# Patient Record
Sex: Male | Born: 1960 | Race: White | Hispanic: No | Marital: Married | Smoking: Never smoker
Health system: Southern US, Community
[De-identification: ages and names within clinical notes are randomized; demographics above are authoritative.]

## PROBLEM LIST (undated history)

## (undated) DIAGNOSIS — E039 Hypothyroidism, unspecified: Secondary | ICD-10-CM

## (undated) DIAGNOSIS — G4733 Obstructive sleep apnea (adult) (pediatric): Secondary | ICD-10-CM

## (undated) DIAGNOSIS — E079 Disorder of thyroid, unspecified: Secondary | ICD-10-CM

## (undated) DIAGNOSIS — D759 Disease of blood and blood-forming organs, unspecified: Secondary | ICD-10-CM

## (undated) DIAGNOSIS — J302 Other seasonal allergic rhinitis: Secondary | ICD-10-CM

## (undated) DIAGNOSIS — E785 Hyperlipidemia, unspecified: Secondary | ICD-10-CM

## (undated) DIAGNOSIS — F419 Anxiety disorder, unspecified: Secondary | ICD-10-CM

## (undated) DIAGNOSIS — I10 Essential (primary) hypertension: Secondary | ICD-10-CM

## (undated) DIAGNOSIS — I2510 Atherosclerotic heart disease of native coronary artery without angina pectoris: Secondary | ICD-10-CM

## (undated) DIAGNOSIS — I447 Left bundle-branch block, unspecified: Secondary | ICD-10-CM

## (undated) DIAGNOSIS — R0602 Shortness of breath: Secondary | ICD-10-CM

## (undated) DIAGNOSIS — K219 Gastro-esophageal reflux disease without esophagitis: Secondary | ICD-10-CM

## (undated) DIAGNOSIS — C4492 Squamous cell carcinoma of skin, unspecified: Secondary | ICD-10-CM

## (undated) DIAGNOSIS — K759 Inflammatory liver disease, unspecified: Secondary | ICD-10-CM

## (undated) DIAGNOSIS — T8859XA Other complications of anesthesia, initial encounter: Secondary | ICD-10-CM

## (undated) DIAGNOSIS — R112 Nausea with vomiting, unspecified: Secondary | ICD-10-CM

## (undated) HISTORY — DX: Obstructive sleep apnea (adult) (pediatric): G47.33

## (undated) HISTORY — DX: Squamous cell carcinoma of skin, unspecified: C44.92

## (undated) HISTORY — DX: Hyperlipidemia, unspecified: E78.5

## (undated) HISTORY — DX: Atherosclerotic heart disease of native coronary artery without angina pectoris: I25.10

## (undated) HISTORY — DX: Shortness of breath: R06.02

## (undated) HISTORY — DX: Other seasonal allergic rhinitis: J30.2

## (undated) HISTORY — PX: OTHER SURGICAL HISTORY: SHX169

## (undated) HISTORY — PX: KNEE SURGERY: SHX244

## (undated) HISTORY — DX: Left bundle-branch block, unspecified: I44.7

## (undated) HISTORY — DX: Essential (primary) hypertension: I10

## (undated) HISTORY — PX: TONSILLECTOMY: SUR1361

## (undated) HISTORY — PX: TOE SURGERY: SHX1073

## (undated) HISTORY — PX: NASAL FRACTURE SURGERY: SHX718

---
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
## 2013-01-15 HISTORY — PX: COLONOSCOPY: SHX174

## 2013-09-28 ENCOUNTER — Emergency Department (HOSPITAL_BASED_OUTPATIENT_CLINIC_OR_DEPARTMENT_OTHER): Payer: 59

## 2013-09-28 ENCOUNTER — Encounter (HOSPITAL_BASED_OUTPATIENT_CLINIC_OR_DEPARTMENT_OTHER): Payer: Self-pay | Admitting: Emergency Medicine

## 2013-09-28 ENCOUNTER — Observation Stay (HOSPITAL_BASED_OUTPATIENT_CLINIC_OR_DEPARTMENT_OTHER)
Admission: EM | Admit: 2013-09-28 | Discharge: 2013-09-29 | Disposition: A | Discharge status: Home / Self Care | Payer: 59 | Attending: Internal Medicine | Admitting: Internal Medicine

## 2013-09-28 DIAGNOSIS — R030 Elevated blood-pressure reading, without diagnosis of hypertension: Principal | ICD-10-CM | POA: Insufficient documentation

## 2013-09-28 DIAGNOSIS — E039 Hypothyroidism, unspecified: Secondary | ICD-10-CM | POA: Diagnosis not present

## 2013-09-28 DIAGNOSIS — R0602 Shortness of breath: Secondary | ICD-10-CM | POA: Diagnosis not present

## 2013-09-28 DIAGNOSIS — I10 Essential (primary) hypertension: Secondary | ICD-10-CM | POA: Diagnosis present

## 2013-09-28 DIAGNOSIS — I459 Conduction disorder, unspecified: Secondary | ICD-10-CM | POA: Diagnosis not present

## 2013-09-28 DIAGNOSIS — I447 Left bundle-branch block, unspecified: Secondary | ICD-10-CM | POA: Diagnosis present

## 2013-09-28 DIAGNOSIS — I160 Hypertensive urgency: Secondary | ICD-10-CM | POA: Insufficient documentation

## 2013-09-28 DIAGNOSIS — IMO0001 Reserved for inherently not codable concepts without codable children: Secondary | ICD-10-CM

## 2013-09-28 HISTORY — DX: Disorder of thyroid, unspecified: E07.9

## 2013-09-28 HISTORY — DX: Hypertensive urgency: I16.0

## 2013-09-28 LAB — BASIC METABOLIC PANEL
BUN: 17 mg/dL (ref 6–23)
CHLORIDE: 100 meq/L (ref 96–112)
CO2: 27 meq/L (ref 19–32)
Calcium: 9.4 mg/dL (ref 8.4–10.5)
Creatinine, Ser: 1 mg/dL (ref 0.50–1.35)
GFR calc Af Amer: >90 mL/min (ref >90)
GFR calc non Af Amer: 83 mL/min — ABNORMAL LOW (ref >90)
GLUCOSE: 100 mg/dL — AB (ref 70–99)
Potassium: 4.1 mEq/L (ref 3.7–5.3)
SODIUM: 140 meq/L (ref 137–147)

## 2013-09-28 LAB — CBC WITH DIFFERENTIAL/PLATELET
Basophils Absolute: 0 10*3/uL (ref 0.0–0.1)
Basophils Relative: 0 % (ref 0–1)
Eosinophils Absolute: 0.1 10*3/uL (ref 0.0–0.7)
Eosinophils Relative: 1 % (ref 0–5)
HEMATOCRIT: 44.1 % (ref 39.0–52.0)
Hemoglobin: 15.7 g/dL (ref 13.0–17.0)
LYMPHS ABS: 1.3 10*3/uL (ref 0.7–4.0)
LYMPHS PCT: 20 % (ref 12–46)
MCH: 32.3 pg (ref 26.0–34.0)
MCHC: 35.6 g/dL (ref 30.0–36.0)
MCV: 90.7 fL (ref 78.0–100.0)
MONO ABS: 0.6 10*3/uL (ref 0.1–1.0)
Monocytes Relative: 9 % (ref 3–12)
NEUTROS ABS: 4.4 10*3/uL (ref 1.7–7.7)
Neutrophils Relative %: 69 % (ref 43–77)
Platelets: 122 10*3/uL — ABNORMAL LOW (ref 150–400)
RBC: 4.86 MIL/uL (ref 4.22–5.81)
RDW: 12.7 % (ref 11.5–15.5)
WBC: 6.4 10*3/uL (ref 4.0–10.5)

## 2013-09-28 LAB — TROPONIN I: Troponin I: <0.3 ng/mL (ref <0.30)

## 2013-09-28 MED ORDER — CLONIDINE HCL 0.1 MG PO TABS
0.1000 mg | ORAL_TABLET | Freq: Once | ORAL | Status: AC
  Administered 2013-09-28: 0.1 mg via ORAL
  Filled 2013-09-28: qty 1

## 2013-09-28 MED ORDER — ASPIRIN 81 MG PO CHEW
324.0000 mg | CHEWABLE_TABLET | Freq: Once | ORAL | Status: AC
  Administered 2013-09-28: 324 mg via ORAL
  Filled 2013-09-28: qty 4

## 2013-09-28 NOTE — ED Provider Notes (Signed)
CSN: [id number]     Arrival date & time 09/28/13  1845 History   This chart was scribed for Malvin Johns, MD by Elby Beck, ED Scribe. This patient was seen in room MH11/MH11 and the patient's care was started at 8:45 PM.   Chief Complaint  Patient presents with  . Hypertension      The history is provided by the patient. No language interpreter was used.    HPI Comments: Peter Spencer is a 53 y.o. male who presents to the Emergency Department complaining of an HTN episode today measured today at a work event- pt has no h/o HTN. Pt reports taking a blood pressure of 180/110 today. ED blood pressure is 161/94. Pt reports that while running at the gym yesterday he experienced associated SOB after running a mile, not usual for him as an avid runner who regularly runs greater distances. He denies any chest pain or tightness. Pt repeatedly reports a lot of work stress recently. Pt reports recent difficulty sleeping. Pt reports occasionall episodes of dizziness due to "low BP" that his family has. Pt denies CP, diaphoresis, HA, numbness, weakness, and reports that he has been "normal" today. Pt reports that he had normal day today, other than did not run. Pt had EKG recently. Pt denies smoking. Pt reports family history of heart problems with paternal grandfather having MI in his 68's, and then dying of CHF in his 59's. Pt maternal grandmother died of MI at 63. Pt family h/o low BP.   PCP  [place] [facility]    Past Medical History  Diagnosis Date  . Thyroid disease    Past Surgical History  Procedure Laterality Date  . Knee surgery    . Tonsillectomy     No family history on file. History  Substance Use Topics  . Smoking status: Never Smoker   . Smokeless tobacco: Not on file  . Alcohol Use: Yes    Review of Systems  Constitutional: Positive for activity change (no running today.). Negative for fever, chills, diaphoresis and fatigue.  HENT: Negative for congestion,  rhinorrhea and sneezing.   Eyes: Negative.   Respiratory: Positive for shortness of breath (after running a mile, pt reports not usual for him). Negative for cough and chest tightness.   Cardiovascular: Negative for chest pain and leg swelling.  Gastrointestinal: Negative for nausea, vomiting, abdominal pain, diarrhea and blood in stool.  Genitourinary: Negative for frequency, hematuria, flank pain and difficulty urinating.  Musculoskeletal: Negative for arthralgias and back pain.  Skin: Negative for rash.  Neurological: Negative for dizziness, speech difficulty, weakness, numbness and headaches.  Psychiatric/Behavioral: Positive for sleep disturbance.       "a lot of stress at work recently"      Allergies  Review of patient's allergies indicates no known allergies.  Home Medications   Current Outpatient Rx  Name  Route  Sig  Dispense  Refill  . Fexofenadine HCl (ALLEGRA PO)   Oral   Take by mouth.         . Levothyroxine Sodium (SYNTHROID PO)   Oral   Take by mouth.          BP 161/94  Pulse 84  Temp(Src) 98.7 F (37.1 C) (Oral)  Resp 16  Ht 6\' 3"  (1.905 m)  Wt 175 lb (79.379 kg)  BMI 21.87 kg/m2  SpO2 100% Physical Exam  Constitutional: He is oriented to person, place, and time. He appears well-developed and well-nourished.  HENT:  Head: Normocephalic  and atraumatic.  Eyes: Pupils are equal, round, and reactive to light.  Neck: Normal range of motion. Neck supple.  Cardiovascular: Normal rate, regular rhythm and normal heart sounds.   Pulmonary/Chest: Effort normal and breath sounds normal. No respiratory distress. He has no wheezes. He has no rales. He exhibits no tenderness.  Abdominal: Soft. Bowel sounds are normal. There is no tenderness. There is no rebound and no guarding.  Musculoskeletal: Normal range of motion. He exhibits no edema.  Lymphadenopathy:    He has no cervical adenopathy.  Neurological: He is alert and oriented to person, place, and  time.  Skin: Skin is warm and dry. No rash noted.  Psychiatric: He has a normal mood and affect.    ED Course  Procedures (including critical care time)  DIAGNOSTIC STUDIES: Oxygen Saturation is 100% on RA, normal by my interpretation.    COORDINATION OF CARE: 8:55 PM-Discussed treatment plan which includes comparing a new EKG with older EKG, measuring BP, a CXR and labs with pt at bedside. Pt agreed to plan.   Labs Review Labs Reviewed  CBC WITH DIFFERENTIAL - Abnormal; Notable for the following:    Platelets 122 (*)    All other components within normal limits  BASIC METABOLIC PANEL - Abnormal; Notable for the following:    Glucose, Bld 100 (*)    GFR calc non Af Amer 83 (*)    All other components within normal limits  TROPONIN I   Imaging Review Dg Chest 2 View  09/28/2013   CLINICAL DATA:  Elevated blood pressure.  EXAM: CHEST  2 VIEW  COMPARISON:  None.  FINDINGS: The cardiac silhouette, mediastinal and hilar contours are within normal limits. Lungs demonstrate hyperinflation and probable emphysematous changes. No infiltrates, edema or effusions. Slight density at the end of the first anterior ribs bilaterally, right greater than left but no definite pulmonary lesions. The bony thorax is intact.  IMPRESSION: Emphysematous changes but no acute pulmonary findings.   Electronically Signed   By: Kalman Jewels M.D.   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  Had normal exercise stress test in May 2014.  Now with LBBB on EKG, exertional SOB, elevated BP. Spoke with Dr. Colon Flattery with cards, decided would be best to admit for serial enzymes, further eval.  Spoke with [facility] who will admit.  I personally performed the services described in this documentation, which was scribed in my presence.  The recorded information has been reviewed and considered.   Malvin Johns, MD 09/28/13 2337

## 2013-09-28 NOTE — Evaluation (Signed)
Received call from [place] about pt with chest pain/dyspnea x 1 day. Pt with reported exertional dyspnea over past 24 hours. Is a baseline runner. Noticed exertional dyspnea today. Baseline hx/o HTN. No true PCP. Does get executive physicals. + family hx/o heart disease. No hx/o CAD. EKG at [facility] shows new LBBB compared to old EKGs in pt's history. Trop  Neg x1. EDP states that she talked with cards fellow. Felt uncomfortable in pt going home. Recommend admission for ACS rule out. Pt given full dose ASA. Will admit to telemetry bed.

## 2013-09-28 NOTE — ED Notes (Signed)
RN Collie Siad informed of Pts food and what he wanted to eat and didn't want a tv dinner

## 2013-09-28 NOTE — ED Notes (Signed)
Took his BP today and it was 180/110. Last night he had pressure in his chest while running. He has been working out and admits to soreness in his chest different from the pressure yesterday.

## 2013-09-28 NOTE — ED Notes (Signed)
Report given to [place] on 3 E

## 2013-09-28 NOTE — ED Notes (Signed)
Patient transported to X-ray 

## 2013-09-29 ENCOUNTER — Encounter (HOSPITAL_COMMUNITY): Payer: Self-pay | Admitting: Internal Medicine

## 2013-09-29 DIAGNOSIS — I369 Nonrheumatic tricuspid valve disorder, unspecified: Secondary | ICD-10-CM

## 2013-09-29 DIAGNOSIS — I447 Left bundle-branch block, unspecified: Secondary | ICD-10-CM | POA: Diagnosis present

## 2013-09-29 DIAGNOSIS — R0602 Shortness of breath: Secondary | ICD-10-CM

## 2013-09-29 DIAGNOSIS — R030 Elevated blood-pressure reading, without diagnosis of hypertension: Principal | ICD-10-CM

## 2013-09-29 DIAGNOSIS — IMO0001 Reserved for inherently not codable concepts without codable children: Secondary | ICD-10-CM | POA: Diagnosis present

## 2013-09-29 HISTORY — DX: Left bundle-branch block, unspecified: I44.7

## 2013-09-29 LAB — COMPREHENSIVE METABOLIC PANEL
ALBUMIN: 3.7 g/dL (ref 3.5–5.2)
ALT: 18 U/L (ref 0–53)
AST: 17 U/L (ref 0–37)
Alkaline Phosphatase: 53 U/L (ref 39–117)
BUN: 15 mg/dL (ref 6–23)
CALCIUM: 8.5 mg/dL (ref 8.4–10.5)
CO2: 25 mEq/L (ref 19–32)
CREATININE: 0.88 mg/dL (ref 0.50–1.35)
Chloride: 100 mEq/L (ref 96–112)
GFR calc Af Amer: >90 mL/min (ref >90)
GFR calc non Af Amer: >90 mL/min (ref >90)
Glucose, Bld: 96 mg/dL (ref 70–99)
Potassium: 3.8 mEq/L (ref 3.7–5.3)
Sodium: 138 mEq/L (ref 137–147)
TOTAL PROTEIN: 6 g/dL (ref 6.0–8.3)
Total Bilirubin: 0.6 mg/dL (ref 0.3–1.2)

## 2013-09-29 LAB — CBC WITH DIFFERENTIAL/PLATELET
Basophils Absolute: 0 10*3/uL (ref 0.0–0.1)
Basophils Relative: 0 % (ref 0–1)
EOS PCT: 2 % (ref 0–5)
Eosinophils Absolute: 0.1 10*3/uL (ref 0.0–0.7)
HCT: 38.7 % — ABNORMAL LOW (ref 39.0–52.0)
Hemoglobin: 14 g/dL (ref 13.0–17.0)
LYMPHS ABS: 1.3 10*3/uL (ref 0.7–4.0)
LYMPHS PCT: 29 % (ref 12–46)
MCH: 32.3 pg (ref 26.0–34.0)
MCHC: 36.2 g/dL — ABNORMAL HIGH (ref 30.0–36.0)
MCV: 89.2 fL (ref 78.0–100.0)
Monocytes Absolute: 0.5 10*3/uL (ref 0.1–1.0)
Monocytes Relative: 11 % (ref 3–12)
Neutro Abs: 2.6 10*3/uL (ref 1.7–7.7)
Neutrophils Relative %: 58 % (ref 43–77)
Platelets: 100 10*3/uL — ABNORMAL LOW (ref 150–400)
RBC: 4.34 MIL/uL (ref 4.22–5.81)
RDW: 12.4 % (ref 11.5–15.5)
WBC: 4.4 10*3/uL (ref 4.0–10.5)

## 2013-09-29 LAB — TSH: TSH: 4.59 u[IU]/mL — AB (ref 0.350–4.500)

## 2013-09-29 LAB — TROPONIN I: Troponin I: <0.3 ng/mL (ref <0.30)

## 2013-09-29 MED ORDER — SODIUM CHLORIDE 0.9 % IJ SOLN
3.0000 mL | Freq: Two times a day (BID) | INTRAMUSCULAR | Status: DC
  Administered 2013-09-29: 3 mL via INTRAVENOUS

## 2013-09-29 MED ORDER — ZOLPIDEM TARTRATE ER 12.5 MG PO TBCR: 12.5000 mg | EXTENDED_RELEASE_TABLET | Freq: Every evening | ORAL | Status: DC | PRN

## 2013-09-29 MED ORDER — ACETAMINOPHEN 325 MG PO TABS: 650.0000 mg | ORAL_TABLET | Freq: Four times a day (QID) | ORAL | Status: DC | PRN

## 2013-09-29 MED ORDER — ONDANSETRON HCL 4 MG PO TABS: 4.0000 mg | ORAL_TABLET | Freq: Four times a day (QID) | ORAL | Status: DC | PRN

## 2013-09-29 MED ORDER — HYDRALAZINE HCL 20 MG/ML IJ SOLN: 10.0000 mg | INTRAMUSCULAR | Status: DC | PRN

## 2013-09-29 MED ORDER — ONDANSETRON HCL 4 MG/2ML IJ SOLN: 4.0000 mg | Freq: Four times a day (QID) | INTRAMUSCULAR | Status: DC | PRN

## 2013-09-29 MED ORDER — ASPIRIN EC 325 MG PO TBEC
325.0000 mg | DELAYED_RELEASE_TABLET | Freq: Every day | ORAL | Status: DC
  Administered 2013-09-29: 325 mg via ORAL
  Filled 2013-09-29: qty 1

## 2013-09-29 MED ORDER — ACETAMINOPHEN 650 MG RE SUPP: 650.0000 mg | Freq: Four times a day (QID) | RECTAL | Status: DC | PRN

## 2013-09-29 MED ORDER — LEVOTHYROXINE SODIUM 88 MCG PO TABS
88.0000 ug | ORAL_TABLET | Freq: Every day | ORAL | Status: DC
  Filled 2013-09-29 (×2): qty 1

## 2013-09-29 MED ORDER — ZOLPIDEM TARTRATE 5 MG PO TABS
5.0000 mg | ORAL_TABLET | Freq: Once | ORAL | Status: AC
  Administered 2013-09-29: 5 mg via ORAL
  Filled 2013-09-29: qty 1

## 2013-09-29 MED ORDER — ENOXAPARIN SODIUM 40 MG/0.4ML ~~LOC~~ SOLN
40.0000 mg | SUBCUTANEOUS | Status: DC
  Filled 2013-09-29: qty 0.4

## 2013-09-29 NOTE — Discharge Summary (Signed)
Physician Discharge Summary  Peter Spencer [id number] DOB: 1961-05-18 DOA: 09/28/2013  PCP: No PCP Per Patient  Admit date: 09/28/2013 Discharge date: 09/29/2013  Time spent: 35 minutes  Recommendations for Outpatient Follow-up:  1. Follow up with cardiology in 1 week for LBBB. 2. monitor Blood pressure.   Discharge Diagnoses:  Principal Problem:   SOB (shortness of breath) Active Problems:   LBBB (left bundle branch block)   Elevated blood pressure   Discharge Condition: stable  Diet recommendation: heart healthy  Filed Weights   09/28/13 1912 09/28/13 2348  Weight: 79.379 kg (175 lb) 79 kg (174 lb 2.6 oz)    History of present illness:  53 y.o. male with history of hypothyroidism had checked his blood pressure at his workplace and was found to be elevated with systolic around 016 and diastolic was more than 010. He came to the ER and had recheck blood pressure which was around 932T systolic and diastolic was around 557. Patient states that he used to he walks on the treadmill but he got short of breath easily yesterday and had to discontinue. In the ER patient's EKG were showing LBBB which was new (patient's wife had brought his old EKG and was normal but I do not have any access to it now). On-call cardiologist was consulted by the ED physician and patient was admitted for further observation. On my exam patient presently is not short of breath denies any chest pain or any nausea vomiting dizziness or palpitations. Patient has had yearly physical examination has had stress test last year which patient states was unremarkable had a CAT scan chest which showed possible coronary artery disease.    Hospital Course:  One episode Shortness of breath with LBBB : - SOB resolved before coming to the ED. - cardiac markers check which were negative x3. - Checked 2-D echo: Wall thickness was increased in a pattern of mild LVH. The estimated ejection fraction was 60%. Wall motion was  normal; there were no regional wall motion abnormalities. - no events on telemetry. - follow up with cardiology in 1 week.  Elevated blood pressure with out a diagnosis of of HTN  - Elevated Bp resolved with out any further treatment  Hypothyroidism:  - continue home medications.   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 The cavity size was normal. Wall thickness was increased in a pattern of mild LVH. The estimated ejection fraction was 60%. Wall motion was normal; there were no regional wall motion abnormalities. - Right ventricle: The cavity size was normal. Systolic function was normal.    Consultations:  none  Discharge Exam: Filed Vitals:   09/29/13 1429  BP: 129/79  Pulse: 86  Temp: 98 F (36.7 C)  Resp: 18    General: A&O x3 Cardiovascular: RRR Respiratory: good air movement CTA B/L  Discharge Instructions  Discharge Orders   Future Orders Complete By Expires   Diet - low sodium heart healthy  As directed    Increase activity slowly  As directed        Medication List         fexofenadine 180 MG tablet  Commonly known as:  ALLEGRA  Take 180 mg by mouth at bedtime.     levothyroxine 88 MCG tablet  Commonly known as:  SYNTHROID, LEVOTHROID  Take 88 mcg by mouth daily before breakfast.     zolpidem 12.5 MG CR tablet  Commonly known as:  AMBIEN CR  Take 1 tablet (12.5 mg total) by  mouth at bedtime as needed for sleep.       Allergies  Allergen Reactions  . Sulfa Antibiotics Nausea And Vomiting       Follow-up Information   Follow up with [facility] In 2 weeks. (hospital follow up)    Contact information:   [place] [address] [phone]       The results of significant diagnostics from this hospitalization (including imaging, microbiology, ancillary and laboratory) are listed below for reference.    Significant Diagnostic Studies: Dg Chest  2 View  09/28/2013   CLINICAL DATA:  Elevated blood pressure.  EXAM: CHEST  2 VIEW  COMPARISON:  None.  FINDINGS: The cardiac silhouette, mediastinal and hilar contours are within normal limits. Lungs demonstrate hyperinflation and probable emphysematous changes. No infiltrates, edema or effusions. Slight density at the end of the first anterior ribs bilaterally, right greater than left but no definite pulmonary lesions. The bony thorax is intact.  IMPRESSION: Emphysematous changes but no acute pulmonary findings.   Electronically Signed   By: Kalman Jewels M.D.   On: 09/28/2013 20:22    Microbiology: No results found for this or any previous visit (from the past 240 hour(s)).   Labs: Basic Metabolic Panel:  Recent Labs Lab 09/28/13 2033 09/29/13 0349  NA 140 138  K 4.1 3.8  CL 100 100  CO2 27 25  GLUCOSE 100* 96  BUN 17 15  CREATININE 1.00 0.88  CALCIUM 9.4 8.5   Liver Function Tests:  Recent Labs Lab 09/29/13 0349  AST 17  ALT 18  ALKPHOS 53  BILITOT 0.6  PROT 6.0  ALBUMIN 3.7   No results found for this basename: LIPASE, AMYLASE,  in the last 168 hours No results found for this basename: AMMONIA,  in the last 168 hours CBC:  Recent Labs Lab 09/28/13 2033 09/29/13 0349  WBC 6.4 4.4  NEUTROABS 4.4 2.6  HGB 15.7 14.0  HCT 44.1 38.7*  MCV 90.7 89.2  PLT 122* 100*   Cardiac Enzymes:  Recent Labs Lab 09/28/13 2033 09/29/13 0349 09/29/13 0650  TROPONINI <0.30 <0.30 <0.30   BNP: BNP (last 3 results) No results found for this basename: PROBNP,  in the last 8760 hours CBG: No results found for this basename: GLUCAP,  in the last 168 hours     Signed:  Charlynne Cousins  [facility] 09/29/2013, 3:10 PM

## 2013-09-29 NOTE — H&P (Signed)
[facility] History and Physical  Peter Spencer [id number] DOB: 1961/01/22 DOA: 09/28/2013  Referring physician: ER physician at [place]. PCP: No PCP Per Patient   Chief Complaint: Shortness of breath and elevated blood pressure.  HPI: Peter Spencer is a 53 y.o. male with history of hypothyroidism had checked his blood pressure at his workplace and was found to be elevated with systolic around 269 and diastolic was more than 485. He came to the ER and had recheck blood pressure which was around 462V systolic and diastolic was around 035. Patient states that he used to he walks on the treadmill but he got short of breath easily yesterday and had to discontinue. In the ER patient's EKG were showing LBBB which was new (patient's wife had brought his old EKG and was normal but I do not have any access to it now). On-call cardiologist was consulted by the ED physician and patient was admitted for further observation. On my exam patient presently is not short of breath denies any chest pain or any nausea vomiting dizziness or palpitations. Patient has had yearly physical examination has had stress test last year which patient states was unremarkable had a CAT scan chest which showed possible coronary artery disease.   Review of Systems: As presented in the history of presenting illness, rest negative.  Past Medical History  Diagnosis Date  . Thyroid disease    Past Surgical History  Procedure Laterality Date  . Knee surgery    . Tonsillectomy     Social History:  reports that he has never smoked. He does not have any smokeless tobacco history on file. He reports that he drinks alcohol. He reports that he does not use illicit drugs. Where does patient live home. Can patient participate in ADLs? Yes.  No Known Allergies  Family History:  Family History  Problem Relation Age of Onset  . CAD Other   . Stroke Neg Hx   . Diabetes Mellitus II Neg Hx       Prior  to Admission medications   Medication Sig Start Date End Date Taking? Authorizing Provider  Fexofenadine HCl (ALLEGRA PO) Take by mouth.   Yes Historical Provider, MD  Levothyroxine Sodium (SYNTHROID PO) Take by mouth.   Yes Historical Provider, MD    Physical Exam: Filed Vitals:   09/28/13 1912 09/28/13 2208 09/28/13 2300 09/28/13 2348  BP: 161/94 160/102 153/95 144/102  Pulse: 84 100 86 106  Temp: 98.7 F (37.1 C)   98.6 F (37 C)  TempSrc: Oral   Oral  Resp: 16 18  18   Height: 6\' 3"  (1.905 m)   6\' 9"  (2.057 m)  Weight: 79.379 kg (175 lb)   79 kg (174 lb 2.6 oz)  SpO2: 100% 100% 99% 98%     General:  Well-developed well-nourished.  Eyes: Anicteric no pallor.  ENT: No discharge from the ears eyes nose mouth.  Neck: No mass felt.  Cardiovascular: S1-S2 heard.  Respiratory: No rhonchi or crepitations.  Abdomen: Soft nontender bowel sounds present.  Skin: Rash on the forehead.  Musculoskeletal: No edema.  Psychiatric: Appears normal.  Neurologic: Alert awake oriented to time place and person. Moves all extremities.  Labs on Admission:  Basic Metabolic Panel:  Recent Labs Lab 09/28/13 2033  NA 140  K 4.1  CL 100  CO2 27  GLUCOSE 100*  BUN 17  CREATININE 1.00  CALCIUM 9.4   Liver Function Tests: No results found for this basename: AST, ALT, ALKPHOS,  BILITOT, PROT, ALBUMIN,  in the last 168 hours No results found for this basename: LIPASE, AMYLASE,  in the last 168 hours No results found for this basename: AMMONIA,  in the last 168 hours CBC:  Recent Labs Lab 09/28/13 2033  WBC 6.4  NEUTROABS 4.4  HGB 15.7  HCT 44.1  MCV 90.7  PLT 122*   Cardiac Enzymes:  Recent Labs Lab 09/28/13 2033  TROPONINI <0.30    BNP (last 3 results) No results found for this basename: PROBNP,  in the last 8760 hours CBG: No results found for this basename: GLUCAP,  in the last 168 hours  Radiological Exams on Admission: Dg Chest 2 View  09/28/2013   CLINICAL  DATA:  Elevated blood pressure.  EXAM: CHEST  2 VIEW  COMPARISON:  None.  FINDINGS: The cardiac silhouette, mediastinal and hilar contours are within normal limits. Lungs demonstrate hyperinflation and probable emphysematous changes. No infiltrates, edema or effusions. Slight density at the end of the first anterior ribs bilaterally, right greater than left but no definite pulmonary lesions. The bony thorax is intact.  IMPRESSION: Emphysematous changes but no acute pulmonary findings.   Electronically Signed   By: Kalman Jewels M.D.   On: 09/28/2013 20:22    EKG: Independently reviewed. Sinus rhythm with LBBB.  Assessment/Plan Principal Problem:   SOB (shortness of breath) Active Problems:   LBBB (left bundle branch block)   Elevated blood pressure   1. Shortness of breath with LBBB - patient's symptoms are concerning for cardiomyopathy. Check 2-D echo. Check cardiac markers. Consult cardiology in a.m. 2. Elevated blood pressure - for now I have placed patient on when necessary IV hydralazine for systolic blood pressure more than 160. Closely observe. 3. Hypothyroidism - continue home medications.    Code Status: Full code.  Family Communication: Patient's wife at the bedside.  Disposition Plan: Admit to inpatient.    Camille Thau N. [facility] Pager [phone].  If 7PM-7AM, please contact night-coverage [email or web address] Password [email or web address] 09/29/2013, 1:17 AM

## 2013-09-29 NOTE — Progress Notes (Signed)
Pt refused to allow lab to draw last set of tropinin.  Dr. Durel Salts notified and ordered for tropinin to be d/c'd.  Karie Kirks, Therapist, sports.

## 2013-09-29 NOTE — Progress Notes (Signed)
  Echocardiogram 2D Echocardiogram has been performed.  Basilia Jumbo 09/29/2013, 12:48 PM

## 2013-09-29 NOTE — Progress Notes (Signed)
All d/c instructions explained and given to pt at 1600.  Verbalized understanding.  D/c off floor via ambulation.  Refused w/c service.  [place] Emmitt,RN.

## 2013-10-01 NOTE — Progress Notes (Signed)
Utilization Review Completed Demetrius Mahler J. Adalie Mand, RN, BSN, NCM [phone]  

## 2013-10-22 ENCOUNTER — Ambulatory Visit: Payer: 59 | Admitting: Cardiology

## 2014-01-23 ENCOUNTER — Ambulatory Visit (HOSPITAL_BASED_OUTPATIENT_CLINIC_OR_DEPARTMENT_OTHER): Payer: 59

## 2014-11-18 DIAGNOSIS — I10 Essential (primary) hypertension: Secondary | ICD-10-CM | POA: Insufficient documentation

## 2014-11-18 DIAGNOSIS — E039 Hypothyroidism, unspecified: Secondary | ICD-10-CM

## 2014-11-18 DIAGNOSIS — F5104 Psychophysiologic insomnia: Secondary | ICD-10-CM

## 2014-11-18 HISTORY — DX: Essential (primary) hypertension: I10

## 2014-11-18 HISTORY — DX: Psychophysiologic insomnia: F51.04

## 2014-11-18 HISTORY — DX: Hypothyroidism, unspecified: E03.9

## 2014-12-02 ENCOUNTER — Other Ambulatory Visit: Payer: Self-pay | Admitting: Cardiology

## 2014-12-02 DIAGNOSIS — I2510 Atherosclerotic heart disease of native coronary artery without angina pectoris: Secondary | ICD-10-CM

## 2014-12-02 NOTE — Progress Notes (Unsigned)
Peter Spencer  Date of visit:  12/02/2014 DOB:  March 08, 1961    Age:  54 yrs. Medical record number:  [id number]     Account number:  [id number] Primary Care Provider: Mauri Pole ____________________________ CURRENT DIAGNOSES  1. Essential (primary) hypertension  2. Left bundle-branch block, unspecified  3. CAD (Native coronary artery)  4. Other hyperlipidemia  5. Hyperlipidemia, unspecified  6. Hypothyroidism, unspecified  7. Gastro-esophageal reflux disease without esophagitis ____________________________ ALLERGIES  Sulfa (Sulfonamides), Nausea ____________________________ MEDICATIONS  1. levothyroxine 88 mcg tablet, 1 p.o. daily  2. fexofenadine 180 mg tablet, 1 p.o. daily  3. aspirin 81 mg chewable tablet, 1 p.o. daily  4. Intermezzo 3.5 mg sublingual tablet, QHS  5. Crestor 20 mg tablet, 3 x week ____________________________ CHIEF COMPLAINTS  Followup of CAD (Native coronary artery) ____________________________ HISTORY OF PRESENT ILLNESS Patient returns for cardiac followup. He has had a good 6 months since he was here but remains anxious about his overall condition and his mild coronary artery disease previously. He has an intermittent left bundle branch block and had a negative myocardial perfusion scan last year. He complains that when he is running long distances that he cannot hold out as much. I could not really get definite complaints of angina. During other normal activities of daily living he feels perfectly fine. He specifically asks about when his next followup imaging study should be. He evidently had a CTA done 3 years ago but we do not have those reports. He brings in records of blood pressure showing very low blood pressures in the 80s and 90s. Blood pressure the office today is low. We had previously reduced his losartan because his pressure had been low previously. ____________________________ PAST HISTORY  Past Medical Illnesses:  hypertension,  hypothyroidism, GERD, hyperlipidemia;  Cardiovascular Illnesses:  conduction disorder-LBBB, CAD;  Surgical Procedures:  knee surgery, left, tonsillectomy/adenoids, nasal surg, toe surg;  NYHA Classification:  I;  Canadian Angina Classification:  Class 0: Asymptomatic;  Cardiology Procedures-Invasive:  no history of prior cardiac procedures;  Cardiology Procedures-Noninvasive:  treadmill, lexiscan cardiolite March 2015;  LVEF of 52% documented via nuclear study on 10/14/2013,   ____________________________ CARDIO-PULMONARY TEST DATES EKG Date:  10/08/2013;  Nuclear Study Date:  10/14/2013;  Echocardiography Date: 09/29/2013;   ____________________________ FAMILY HISTORY Father -- Atrial fibrillation, Dementia/Alzheimers, Parkinsonism, Father dead Mother -- Mother dead, Carcinoma of the pancreas Sister -- Sister alive and well ____________________________ SOCIAL HISTORY Alcohol Use:  wine 1 per day;  Smoking:  nonsmoker;  Diet:  regular diet;  Lifestyle:  married, 1 son and 1 daughter;  Exercise:  exercises regularly;  Occupation:  [employer], Metallurgist;   ____________________________ REVIEW OF SYSTEMS General:  denies recent weight change, fatique or change in exercise tolerance. Respiratory: denies dyspnea, cough, wheezing or hemoptysis. Cardiovascular:  please review HPI Abdominal: dyspepsia and waterbrashNeurological:  occasional headaches  ____________________________ PHYSICAL EXAMINATION VITAL SIGNS  Blood Pressure:  104/60 Sitting, Left arm, regular cuff  , 100/60 Standing, Left arm and regular cuff   Pulse:  76/min. Weight:  178.00 lbs. Height:  75"BMI: 22  Constitutional:  Pleasant, thin anxious white male in no acute distress Skin:  warm and dry to touch, no apparent skin lesions, or masses noted. Head:  normocephalic, normal hair pattern, no masses or tenderness Chest:  normal symmetry, clear to auscultation. Cardiac:  regular rhythm, normal S1 and S2, No S3 or S4, no  murmurs, gallops or rubs detected. Peripheral Pulses:  the femoral,dorsalis pedis, and posterior tibial pulses  are full and equal bilaterally with no bruits auscultated. Extremities & Back:  no deformities, clubbing, cyanosis, erythema or edema observed. Normal muscle strength and tone. Neurological:  no gross motor or sensory deficits noted, affect appropriate, oriented x3. ____________________________ MOST RECENT LIPID PANEL 12/02/14  CHOL TOTL 148 mg/dl, LDL 63 NM, HDL 52 mg/dl, TRIGLYCER 162 mg/dl and CHOL/HDL 2.8 (Calc) ____________________________ IMPRESSIONS/PLAN  1. History of mild coronary artery disease 2. Hyperlipidemia and treatment 3. History of hypertension with very low blood pressures noted nail  Recommendations:  He is concerned about his ability to run and do other things. We discussed radiation exposure and long-term monitoring in terms of coronary artery disease. I would recommend that he have a CPX test to assess whether he has any cardiovascular to his symptoms. If this is demonstrated then we can proceed with a CT angiogram if needed. He just had a negative myocardial perfusion scan last year. It is conceivable that some of his symptoms could be related to the development of a left bundle branch block with exercise. Followup otherwise in 6 months. His blood pressure is low and I asked him to stop taking losartan and to monitor his blood pressure. ____________________________ TODAYS ORDERS  1. Cardiopulmonary Stress Test: At patient convenience  2. Return Visit: 6 months                       ____________________________ Cardiology Physician:  Kerry Hough MD [facility]

## 2014-12-07 ENCOUNTER — Telehealth (HOSPITAL_COMMUNITY): Payer: Self-pay | Admitting: Cardiology

## 2014-12-07 NOTE — Telephone Encounter (Signed)
-----   Message from Jolaine Artist, MD sent at 12/02/2014  4:43 PM EDT ----- Regarding: FW: cpx test Can you help with this? Thanks!  ----- Message -----    From: Jacolyn Reedy, MD    Sent: 12/02/2014   4:09 PM      To: Jolaine Artist, MD, Candis Schatz Subject: cpx test                                       Please schedule this for this patient.  I was not able to order it through [facility].

## 2014-12-12 NOTE — Telephone Encounter (Signed)
-----   Message from Jolaine Artist, MD sent at 12/02/2014  4:43 PM EDT ----- Regarding: FW: cpx test Can you help with this? Thanks!  ----- Message -----    From: Jacolyn Reedy, MD    Sent: 12/02/2014   4:09 PM      To: Jolaine Artist, MD, Candis Schatz Subject: cpx test                                       Please schedule this for this patient.  I was not able to order it through [facility].

## 2014-12-13 ENCOUNTER — Other Ambulatory Visit (HOSPITAL_COMMUNITY): Payer: Self-pay | Admitting: Cardiology

## 2014-12-13 DIAGNOSIS — R0602 Shortness of breath: Secondary | ICD-10-CM

## 2014-12-14 ENCOUNTER — Other Ambulatory Visit (HOSPITAL_COMMUNITY): Payer: Self-pay | Admitting: Cardiology

## 2014-12-14 ENCOUNTER — Ambulatory Visit (HOSPITAL_COMMUNITY): Payer: 59 | Attending: Internal Medicine

## 2014-12-14 DIAGNOSIS — R0602 Shortness of breath: Secondary | ICD-10-CM

## 2015-02-28 ENCOUNTER — Other Ambulatory Visit: Payer: Self-pay | Admitting: Cardiology

## 2015-02-28 DIAGNOSIS — R0600 Dyspnea, unspecified: Secondary | ICD-10-CM

## 2015-03-08 ENCOUNTER — Other Ambulatory Visit: Payer: Self-pay | Admitting: Cardiology

## 2015-03-15 ENCOUNTER — Ambulatory Visit (HOSPITAL_COMMUNITY): Payer: 59

## 2015-03-20 ENCOUNTER — Encounter (HOSPITAL_COMMUNITY): Payer: Self-pay

## 2015-03-20 ENCOUNTER — Ambulatory Visit (HOSPITAL_COMMUNITY)
Admission: RE | Admit: 2015-03-20 | Discharge: 2015-03-20 | Disposition: A | Discharge status: Home / Self Care | Payer: 59 | Source: Ambulatory Visit | Attending: Cardiology | Admitting: Cardiology

## 2015-03-20 DIAGNOSIS — I2510 Atherosclerotic heart disease of native coronary artery without angina pectoris: Secondary | ICD-10-CM | POA: Insufficient documentation

## 2015-03-20 DIAGNOSIS — M479 Spondylosis, unspecified: Secondary | ICD-10-CM | POA: Insufficient documentation

## 2015-03-20 DIAGNOSIS — R079 Chest pain, unspecified: Secondary | ICD-10-CM | POA: Diagnosis not present

## 2015-03-20 DIAGNOSIS — R0600 Dyspnea, unspecified: Secondary | ICD-10-CM | POA: Insufficient documentation

## 2015-03-20 MED ORDER — NITROGLYCERIN 0.4 MG SL SUBL
0.8000 mg | SUBLINGUAL_TABLET | SUBLINGUAL | Status: DC | PRN
  Administered 2015-03-20: 0.8 mg via SUBLINGUAL
  Filled 2015-03-20: qty 25

## 2015-03-20 MED ORDER — METOPROLOL TARTRATE 1 MG/ML IV SOLN
INTRAVENOUS | Status: AC
  Administered 2015-03-20: 5 mg via INTRAVENOUS
  Filled 2015-03-20: qty 5

## 2015-03-20 MED ORDER — IOHEXOL 350 MG/ML SOLN
100.0000 mL | Freq: Once | INTRAVENOUS | Status: AC | PRN
  Administered 2015-03-20: 100 mL via INTRAVENOUS

## 2015-03-20 MED ORDER — NITROGLYCERIN 0.4 MG SL SUBL: 0.4000 mg | SUBLINGUAL_TABLET | SUBLINGUAL | Status: DC | PRN

## 2015-03-20 MED ORDER — METOPROLOL TARTRATE 1 MG/ML IV SOLN
5.0000 mg | INTRAVENOUS | Status: DC | PRN
  Filled 2015-03-20: qty 5

## 2015-03-20 MED ORDER — NITROGLYCERIN 0.4 MG SL SUBL
SUBLINGUAL_TABLET | SUBLINGUAL | Status: AC
  Administered 2015-03-20: 0.8 mg via SUBLINGUAL
  Filled 2015-03-20: qty 1

## 2015-03-20 MED ORDER — NITROGLYCERIN 0.4 MG SL SUBL
SUBLINGUAL_TABLET | SUBLINGUAL | Status: AC
  Filled 2015-03-20: qty 1

## 2015-07-30 HISTORY — PX: MOHS SURGERY: SUR867

## 2015-11-27 HISTORY — PX: COLONOSCOPY: SHX174

## 2015-11-27 LAB — HM COLONOSCOPY

## 2016-01-15 ENCOUNTER — Ambulatory Visit (HOSPITAL_BASED_OUTPATIENT_CLINIC_OR_DEPARTMENT_OTHER): Payer: Commercial Managed Care - HMO | Attending: Cardiology | Admitting: Internal Medicine

## 2016-01-15 VITALS — Ht 75.0 in | Wt 176.0 lb

## 2016-01-15 DIAGNOSIS — R0683 Snoring: Secondary | ICD-10-CM

## 2016-01-15 DIAGNOSIS — G4733 Obstructive sleep apnea (adult) (pediatric): Secondary | ICD-10-CM

## 2016-01-15 DIAGNOSIS — I2510 Atherosclerotic heart disease of native coronary artery without angina pectoris: Secondary | ICD-10-CM | POA: Insufficient documentation

## 2016-01-23 IMAGING — CT CT HEART MORP W/ CTA COR W/ SCORE W/ CA W/CM &/OR W/O CM
1 of 10 series · 1 of 20 positions shown, 2 images · IV contrast (Iodine)
Comparison: Chest radiograph of 09/28/2013

CLINICAL DATA: Chest pain

EXAM:
Cardiac/Coronary  CT
TECHNIQUE: The patient was scanned on a Philips 256 scanner.

[Series 300: locator · axial · 0.35mm/px · z∈[-233,-233]mm · 1 of 1 slices shown, 2 images]
[im 1/1  vessel]
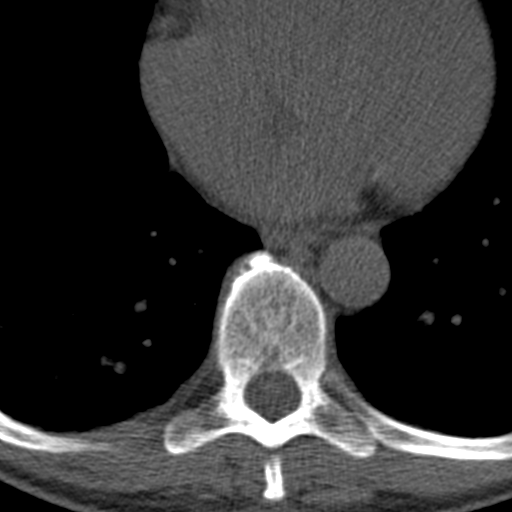
[im 1/1  lung]
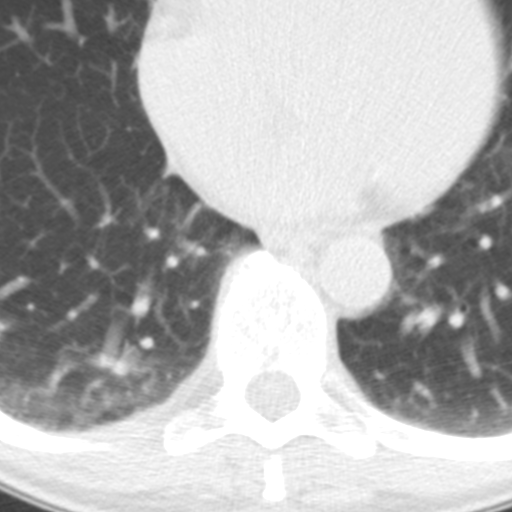

[1 of 20 positions shown; findings below may reference images not displayed]

FINDINGS: A 120 kV prospective scan was triggered in the descending thoracic
aorta at 111 HU's. Axial non-contrast 3 mm slices were carried out
through the heart. The data set was analyzed on a dedicated work
station and scored using the Agatson method. Gantry rotation speed
was 270 msecs and collimation was .9 mm. 5 mg of iv Metoprolol and
and 0.8 mg of sl NTG was given. The 3D data set was reconstructed in
5% intervals of the 67-82 % of the R-R cycle. Diastolic phases were
analyzed on a dedicated work station using MPR, MIP and VRT modes.
The patient received 80 cc of contrast.

Aorta:  Normal caliber.  No Dissection.

Aortic Valve:  Trileaflet.  No calcifications.

Coronary Arteries:  Normal coronary origin. Right dominance.

RCA is a large dominant vessel that has minimal noncalcified plaque
in its mid segment associated with 0-25% stenosis.

Left main is a large caliber long vessel that gives rise to LAD and
LCX arteries and has no plaque.

LAD is a large caliber vessel that wraps around the apex and gives
rise to one medium sized diagonal artery.

Proximal LAD at the takeoff of the diagonal branch has a moderate
mixed plaque with associated stenosis 25-50%. However this is a
plaque with high risk features - napkin ring sign (calcified ring
with lipomatous core). Mid LAD has minimal calcified plaque with
0-25% stenosis.

Diagonal branch has minimal calcified plaque with 0-25% stenosis.

LCX artery is medium size non-dominant vessel that gives rise to one
OM branch, there is no significant plaque.

Other findings:

No ASD or VSD seen.

Normal pulmonary vein drainage.
IMPRESSION: 1. Coronary calcium score of 107. This was 68 percentile for age and
sex matched control.

2. Normal coronary origin. Right dominance.

3. Moderate mixed plaque in the proximal LAD with associated
stenosis 25-50% with high risk features - aggressive risk factors
modification is recommended.

Larhonda Ordones

EXAM:
OVER-READ INTERPRETATION  CT CHEST

The following report is an over-read performed by radiologist Dr.
does not include interpretation of cardiac or coronary anatomy or
pathology. The coronary CTA interpretation by the cardiologist is
attached.
FINDINGS: Mediastinum/Nodes: Normal thoracic aortic caliber, without evidence
of dissection. No central pulmonary embolism, on this non-dedicated
study. No image thoracic adenopathy.

Lungs/Pleura: No pleural fluid.  No nodules or airspace opacities.

Upper abdomen: Normal imaged portions of the liver, spleen, stomach,
adrenal glands.

Musculoskeletal: No acute osseous abnormality. Thoracic spondylosis.
IMPRESSION: No significant extracardiac findings within the imaged chest.

## 2016-01-27 DIAGNOSIS — G4733 Obstructive sleep apnea (adult) (pediatric): Secondary | ICD-10-CM

## 2016-01-27 DIAGNOSIS — R0683 Snoring: Secondary | ICD-10-CM | POA: Diagnosis not present

## 2016-01-27 NOTE — Procedures (Signed)
   Patient Name: Peter Spencer, Peter Spencer Date: 01/15/2016 Gender: Male D.O.B: 14-Oct-1960 Age (years): 55 Referring Provider: Landry Corporal Height (inches): 97 Interpreting Physician: Baird Lyons MD, ABSM Weight (lbs): 176 RPSGT: Jacolyn Reedy BMI: 22 MRN: [id number] Neck Size: 17.00 CLINICAL INFORMATION Sleep Study Type: unattended HST   Indication for sleep study: CAD, Snoring   Epworth Sleepiness Score: 3 SLEEP STUDY TECHNIQUE A multi-channel overnight portable sleep study was performed. The channels recorded were: nasal airflow, thoracic respiratory movement, and oxygen saturation with a pulse oximetry. Snoring was also monitored.  MEDICATIONS Patient self administered medications include: none reported during study night.  SLEEP ARCHITECTURE Patient was studied for 405.8 minutes. The sleep efficiency was 93.3 % and the patient was supine for 29.8%. The arousal index was 0.0 per hour.  RESPIRATORY PARAMETERS The overall AHI was 8.4 per hour, with a central apnea index of 0.0 per hour. The oxygen nadir was 78% during sleep. Total time Mean oxygen saturation was 94%    CARDIAC DATA Mean heart rate during sleep was 78.1 bpm.  IMPRESSIONS - Mild obstructive sleep apnea occurred during this study (AHI = 8.4/h). - No significant central sleep apnea occurred during this study (CAI = 0.0/h). - Mild oxygen desaturation was noted during this study (Min O2 = 78%, Mean 94%). - No snoring was audible during this study.  DIAGNOSIS - Obstructive Sleep Apnea (327.23 [G47.33 ICD-10])  RECOMMENDATIONS - CPAP, a dental oral device or a chin strap might be considerations based on clinical judgment. - Positional therapy avoiding supine position during sleep. - Surgical consultation for Uvulopalatopharyngoplasty (UPPP) may be considered in some cases. - Avoid alcohol, sedatives and other CNS depressants that may worsen sleep apnea and disrupt normal sleep architecture. -  Sleep hygiene should be reviewed to assess factors that may improve sleep quality. - Weight management and regular exercise should be initiated or continued.  Deneise Lever Diplomate, American Board of Sleep Medicine  ELECTRONICALLY SIGNED ON:  01/27/2016, 9:04 AM [place] PH: [phone]   FX: [phone] [place]

## 2016-04-25 ENCOUNTER — Other Ambulatory Visit: Payer: Self-pay | Admitting: Orthopedic Surgery

## 2016-04-25 LAB — HM HEPATITIS C SCREENING LAB: HM Hepatitis Screen: NEGATIVE

## 2016-05-29 HISTORY — PX: DUPUYTREN CONTRACTURE RELEASE: SHX1478

## 2016-07-05 ENCOUNTER — Encounter (HOSPITAL_BASED_OUTPATIENT_CLINIC_OR_DEPARTMENT_OTHER): Payer: Self-pay | Admitting: *Deleted

## 2016-07-11 ENCOUNTER — Encounter (HOSPITAL_BASED_OUTPATIENT_CLINIC_OR_DEPARTMENT_OTHER): Payer: Self-pay | Admitting: Anesthesiology

## 2016-07-11 ENCOUNTER — Encounter (HOSPITAL_BASED_OUTPATIENT_CLINIC_OR_DEPARTMENT_OTHER)
Admission: RE | Disposition: A | Discharge status: Home / Self Care | Payer: Self-pay | Source: Ambulatory Visit | Attending: Orthopedic Surgery

## 2016-07-11 ENCOUNTER — Ambulatory Visit (HOSPITAL_BASED_OUTPATIENT_CLINIC_OR_DEPARTMENT_OTHER): Payer: Commercial Managed Care - HMO | Admitting: Anesthesiology

## 2016-07-11 ENCOUNTER — Ambulatory Visit (HOSPITAL_BASED_OUTPATIENT_CLINIC_OR_DEPARTMENT_OTHER)
Admission: RE | Admit: 2016-07-11 | Discharge: 2016-07-11 | Disposition: A | Discharge status: Home / Self Care | Payer: Commercial Managed Care - HMO | Source: Ambulatory Visit | Attending: Orthopedic Surgery | Admitting: Orthopedic Surgery

## 2016-07-11 DIAGNOSIS — E039 Hypothyroidism, unspecified: Secondary | ICD-10-CM | POA: Insufficient documentation

## 2016-07-11 DIAGNOSIS — I447 Left bundle-branch block, unspecified: Secondary | ICD-10-CM | POA: Insufficient documentation

## 2016-07-11 DIAGNOSIS — M720 Palmar fascial fibromatosis [Dupuytren]: Secondary | ICD-10-CM | POA: Diagnosis present

## 2016-07-11 DIAGNOSIS — F419 Anxiety disorder, unspecified: Secondary | ICD-10-CM | POA: Diagnosis not present

## 2016-07-11 DIAGNOSIS — K219 Gastro-esophageal reflux disease without esophagitis: Secondary | ICD-10-CM | POA: Insufficient documentation

## 2016-07-11 DIAGNOSIS — Z8249 Family history of ischemic heart disease and other diseases of the circulatory system: Secondary | ICD-10-CM | POA: Insufficient documentation

## 2016-07-11 DIAGNOSIS — I10 Essential (primary) hypertension: Secondary | ICD-10-CM | POA: Insufficient documentation

## 2016-07-11 HISTORY — DX: Anxiety disorder, unspecified: F41.9

## 2016-07-11 HISTORY — DX: Hypothyroidism, unspecified: E03.9

## 2016-07-11 HISTORY — DX: Gastro-esophageal reflux disease without esophagitis: K21.9

## 2016-07-11 HISTORY — PX: FASCIECTOMY: SHX6525

## 2016-07-11 SURGERY — FASCIECTOMY, PALM
Anesthesia: Monitor Anesthesia Care | Site: Hand | Laterality: Left

## 2016-07-11 MED ORDER — HYDROCODONE-ACETAMINOPHEN 5-325 MG PO TABS: 1.0000 | ORAL_TABLET | Freq: Four times a day (QID) | ORAL | 0 refills | Status: DC | PRN

## 2016-07-11 MED ORDER — PROMETHAZINE HCL 25 MG/ML IJ SOLN: 6.2500 mg | INTRAMUSCULAR | Status: DC | PRN

## 2016-07-11 MED ORDER — CEFAZOLIN SODIUM-DEXTROSE 2-4 GM/100ML-% IV SOLN
INTRAVENOUS | Status: AC
  Filled 2016-07-11: qty 100

## 2016-07-11 MED ORDER — PROPOFOL 500 MG/50ML IV EMUL
INTRAVENOUS | Status: AC
  Filled 2016-07-11: qty 50

## 2016-07-11 MED ORDER — CEFAZOLIN SODIUM-DEXTROSE 2-4 GM/100ML-% IV SOLN
2.0000 g | INTRAVENOUS | Status: AC
  Administered 2016-07-11: 2 g via INTRAVENOUS

## 2016-07-11 MED ORDER — CHLORHEXIDINE GLUCONATE 4 % EX LIQD: 60.0000 mL | Freq: Once | CUTANEOUS | Status: DC

## 2016-07-11 MED ORDER — ONDANSETRON HCL 4 MG/2ML IJ SOLN
INTRAMUSCULAR | Status: AC
  Filled 2016-07-11: qty 2

## 2016-07-11 MED ORDER — SCOPOLAMINE 1 MG/3DAYS TD PT72: 1.0000 | MEDICATED_PATCH | Freq: Once | TRANSDERMAL | Status: DC | PRN

## 2016-07-11 MED ORDER — MIDAZOLAM HCL 2 MG/2ML IJ SOLN
INTRAMUSCULAR | Status: AC
  Filled 2016-07-11: qty 2

## 2016-07-11 MED ORDER — FENTANYL CITRATE (PF) 100 MCG/2ML IJ SOLN
50.0000 ug | INTRAMUSCULAR | Status: DC | PRN
  Administered 2016-07-11: 50 ug via INTRAVENOUS

## 2016-07-11 MED ORDER — PROPOFOL 10 MG/ML IV BOLUS
INTRAVENOUS | Status: DC | PRN
  Administered 2016-07-11: 20 mg via INTRAVENOUS

## 2016-07-11 MED ORDER — THROMBIN 5000 UNITS EX SOLR
CUTANEOUS | Status: DC | PRN
  Administered 2016-07-11: 5000 [IU] via TOPICAL

## 2016-07-11 MED ORDER — FENTANYL CITRATE (PF) 100 MCG/2ML IJ SOLN
INTRAMUSCULAR | Status: AC
  Filled 2016-07-11: qty 2

## 2016-07-11 MED ORDER — LACTATED RINGERS IV SOLN
INTRAVENOUS | Status: DC
  Administered 2016-07-11: 09:00:00 via INTRAVENOUS

## 2016-07-11 MED ORDER — MIDAZOLAM HCL 2 MG/2ML IJ SOLN
1.0000 mg | INTRAMUSCULAR | Status: DC | PRN
  Administered 2016-07-11: 2 mg via INTRAVENOUS

## 2016-07-11 MED ORDER — ONDANSETRON HCL 4 MG/2ML IJ SOLN
INTRAMUSCULAR | Status: DC | PRN
  Administered 2016-07-11: 4 mg via INTRAVENOUS

## 2016-07-11 MED ORDER — ROPIVACAINE HCL 7.5 MG/ML IJ SOLN
INTRAMUSCULAR | Status: DC | PRN
  Administered 2016-07-11: 20 mL via PERINEURAL

## 2016-07-11 MED ORDER — THROMBIN 5000 UNITS EX SOLR
CUTANEOUS | Status: AC
  Filled 2016-07-11: qty 5000

## 2016-07-11 MED ORDER — PROPOFOL 500 MG/50ML IV EMUL
INTRAVENOUS | Status: DC | PRN
  Administered 2016-07-11: 100 ug/kg/min via INTRAVENOUS

## 2016-07-11 MED ORDER — FENTANYL CITRATE (PF) 100 MCG/2ML IJ SOLN: 25.0000 ug | INTRAMUSCULAR | Status: DC | PRN

## 2016-07-11 SURGICAL SUPPLY — 38 items
BLADE MINI RND TIP GREEN BEAV (BLADE) ×3 IMPLANT
BLADE SURG 15 STRL LF DISP TIS (BLADE) ×1 IMPLANT
BLADE SURG 15 STRL SS (BLADE) ×2
BNDG COHESIVE 3X5 TAN STRL LF (GAUZE/BANDAGES/DRESSINGS) ×3 IMPLANT
BNDG ESMARK 4X9 LF (GAUZE/BANDAGES/DRESSINGS) ×3 IMPLANT
BNDG GAUZE ELAST 4 BULKY (GAUZE/BANDAGES/DRESSINGS) ×3 IMPLANT
CHLORAPREP W/TINT 26ML (MISCELLANEOUS) ×3 IMPLANT
CORDS BIPOLAR (ELECTRODE) ×3 IMPLANT
COVER BACK TABLE 60X90IN (DRAPES) ×3 IMPLANT
COVER MAYO STAND STRL (DRAPES) ×3 IMPLANT
CUFF TOURNIQUET SINGLE 18IN (TOURNIQUET CUFF) IMPLANT
DECANTER SPIKE VIAL GLASS SM (MISCELLANEOUS) IMPLANT
DRAPE EXTREMITY T 121X128X90 (DRAPE) ×3 IMPLANT
DRAPE SURG 17X23 STRL (DRAPES) ×3 IMPLANT
GAUZE SPONGE 4X4 12PLY STRL (GAUZE/BANDAGES/DRESSINGS) ×3 IMPLANT
GAUZE XEROFORM 1X8 LF (GAUZE/BANDAGES/DRESSINGS) ×3 IMPLANT
GLOVE BIOGEL PI IND STRL 8.5 (GLOVE) ×1 IMPLANT
GLOVE BIOGEL PI INDICATOR 8.5 (GLOVE) ×2
GLOVE SURG ORTHO 8.0 STRL STRW (GLOVE) ×3 IMPLANT
GOWN STRL REUS W/ TWL LRG LVL3 (GOWN DISPOSABLE) ×1 IMPLANT
GOWN STRL REUS W/TWL LRG LVL3 (GOWN DISPOSABLE) ×2
GOWN STRL REUS W/TWL XL LVL3 (GOWN DISPOSABLE) ×3 IMPLANT
LOOP VESSEL MAXI BLUE (MISCELLANEOUS) ×3 IMPLANT
NEEDLE PRECISIONGLIDE 27X1.5 (NEEDLE) ×3 IMPLANT
NS IRRIG 1000ML POUR BTL (IV SOLUTION) ×3 IMPLANT
PACK BASIN DAY SURGERY FS (CUSTOM PROCEDURE TRAY) ×3 IMPLANT
PAD CAST 3X4 CTTN HI CHSV (CAST SUPPLIES) ×1 IMPLANT
PADDING CAST COTTON 3X4 STRL (CAST SUPPLIES) ×2
SLEEVE SCD COMPRESS KNEE MED (MISCELLANEOUS) ×3 IMPLANT
SPLINT PLASTER CAST XFAST 3X15 (CAST SUPPLIES) IMPLANT
SPLINT PLASTER XTRA FASTSET 3X (CAST SUPPLIES)
STOCKINETTE 4X48 STRL (DRAPES) ×3 IMPLANT
SUT ETHILON 4 0 PS 2 18 (SUTURE) ×6 IMPLANT
SUT SILK 2 0 FS (SUTURE) ×3 IMPLANT
SYR BULB 3OZ (MISCELLANEOUS) ×3 IMPLANT
SYR CONTROL 10ML LL (SYRINGE) ×3 IMPLANT
TOWEL OR 17X24 6PK STRL BLUE (TOWEL DISPOSABLE) ×6 IMPLANT
UNDERPAD 30X30 (UNDERPADS AND DIAPERS) ×3 IMPLANT

## 2016-07-11 NOTE — Discharge Instructions (Addendum)

## 2016-07-11 NOTE — Transfer of Care (Signed)
Immediate Anesthesia Transfer of Care Note  Patient: Peter Spencer  Procedure(s) Performed: Procedure(s) with comments: FASCIECTOMY left index possible joint release (Left) - FASCIECTOMY left index possible joint release  Patient Location: PACU  Anesthesia Type:MAC and Regional  Level of Consciousness: awake, alert  and oriented  Airway & Oxygen Therapy: Patient Spontanous Breathing and Patient connected to face mask oxygen  Post-op Assessment: Report given to RN and Post -op Vital signs reviewed and stable  Post vital signs: Reviewed and stable  Last Vitals:  Vitals:   07/11/16 0929 07/11/16 1057  BP:  132/83  Pulse: 76 66  Resp: 12 10  Temp:  36.3 C    Last Pain:  Vitals:   07/11/16 0857  TempSrc: Oral         Complications: No apparent anesthesia complications

## 2016-07-11 NOTE — Brief Op Note (Signed)
07/11/2016  10:57 AM  PATIENT:  Vivia Budge  55 y.o. male  PRE-OPERATIVE DIAGNOSIS:  dupuytren's contracture left index  POST-OPERATIVE DIAGNOSIS:  dupuytren's contracture left index  PROCEDURE:  Procedure(s) with comments: FASCIECTOMY left index possible joint release (Left) - FASCIECTOMY left index possible joint release  SURGEON:  Surgeon(s) and Role:    * Daryll Brod, MD - Primary  PHYSICIAN ASSISTANT:   ASSISTANTS: none   ANESTHESIA:   regional and IV sedation  EBL:  Total I/O In: 100 [I.V.:100] Out: -   BLOOD ADMINISTERED:none  DRAINS: none   LOCAL MEDICATIONS USED:  NONE  SPECIMEN:  Excision  DISPOSITION OF SPECIMEN:  PATHOLOGY  COUNTS:  YES  TOURNIQUET:   Total Tourniquet Time Documented: Upper Arm (Left) - 54 minutes Total: Upper Arm (Left) - 54 minutes   DICTATION: .Other Dictation: Dictation Number [id number]  PLAN OF CARE: Discharge to home after PACU  PATIENT DISPOSITION:  PACU - hemodynamically stable.

## 2016-07-11 NOTE — Anesthesia Preprocedure Evaluation (Addendum)
Anesthesia Evaluation  Patient identified by MRN, date of birth, ID band Patient awake    Reviewed: Allergy & Precautions, NPO status , Patient's Chart, lab work & pertinent test results  Airway Mallampati: II  TM Distance: <3 FB Neck ROM: Full    Dental  (+) Teeth Intact, Dental Advisory Given   Pulmonary neg pulmonary ROS,    Pulmonary exam normal breath sounds clear to auscultation       Cardiovascular hypertension, Pt. on medications Normal cardiovascular exam+ dysrhythmias (LBBB)  Rhythm:Regular Rate:Normal     Neuro/Psych PSYCHIATRIC DISORDERS Anxiety negative neurological ROS     GI/Hepatic Neg liver ROS, GERD  Medicated,  Endo/Other  Hypothyroidism   Renal/GU negative Renal ROS     Musculoskeletal negative musculoskeletal ROS (+)   Abdominal   Peds  Hematology negative hematology ROS (+)   Anesthesia Other Findings Day of surgery medications reviewed with the patient.  Reproductive/Obstetrics                           Anesthesia Physical Anesthesia Plan  ASA: II  Anesthesia Plan: Regional and MAC   Post-op Pain Management:  Regional for Post-op pain   Induction: Intravenous  Airway Management Planned: Nasal Cannula  Additional Equipment:   Intra-op Plan:   Post-operative Plan:   Informed Consent: I have reviewed the patients History and Physical, chart, labs and discussed the procedure including the risks, benefits and alternatives for the proposed anesthesia with the patient or authorized representative who has indicated his/her understanding and acceptance.   Dental advisory given  Plan Discussed with:   Anesthesia Plan Comments: (Risks/benefits of regional block discussed with patient including risk of bleeding, infection, nerve damage, and possibility of failed block.  Also discussed backup plan of general anesthesia and associated risks.  Patient wishes to  proceed.  Discussed risks and benefits of supraclavicular nerve block including failure, bleeding, infection, nerve damage, weakness, shortness of breath, pneumothorax. Questions answered. Patient consents to block. )        Anesthesia Quick Evaluation

## 2016-07-11 NOTE — H&P (Signed)
  Peter Spencer is an 55 y.o. male.   Chief Complaint: contracture left index finger HPI: Peter Spencer is a 55 year old right hand dominant male who is of [place] descent with Dupuytren's contracture primarily to his index finger that is bent on his left side. This is primarily a contracture at the PIP joint with cord to the MP but no contracture. He does not have Dupuytren's cords on remaining digits, although he does have thickening of the palmar fascia bilaterally. He has no associated fever or penis problems. His sister has had a problem similar to this and his father has been operated on in the past. He states that his finger seems to have gotten slightly worse since last being seen. He has no new injury to it. He is not complaining of any pain or discomfort at the present time.       Past Medical History:  Diagnosis Date  . Anxiety   . GERD (gastroesophageal reflux disease)   . Hypertension    no meds now  . Hypothyroidism   . Left bundle branch block   . SOB (shortness of breath)   . Thyroid disease     Past Surgical History:  Procedure Laterality Date  . KNEE SURGERY    . TONSILLECTOMY      Family History  Problem Relation Age of Onset  . CAD Other   . Stroke Neg Hx   . Diabetes Mellitus II Neg Hx    Social History:  reports that he has never smoked. He has never used smokeless tobacco. He reports that he drinks alcohol. He reports that he does not use drugs.  Allergies:  Allergies  Allergen Reactions  . Sulfa Antibiotics Nausea And Vomiting  . Tetracyclines & Related Other (See Comments)    Caused liver enzymes to go up    No prescriptions prior to admission.    No results found for this or any previous visit (from the past 48 hour(s)).  No results found.   Pertinent items are noted in HPI.  Height 6\' 3"  (1.905 m), weight 79.4 kg (175 lb).  General appearance: alert, cooperative and appears stated age Head: Normocephalic,  without obvious abnormality Neck: no JVD Resp: clear to auscultation bilaterally Cardio: regular rate and rhythm, S1, S2 normal, no murmur, click, rub or gallop GI: soft, non-tender; bowel sounds normal; no masses,  no organomegaly Extremities: contracture left index finger Pulses: 2+ and symmetric Skin: Skin color, texture, turgor normal. No rashes or lesions Neurologic: Grossly normal Incision/Wound: na  Assessment/Plan  Assessment:  1. 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 Pre, peri and postoperative course were discussed along with risks and complications. He is aware that there is no guarantee to the surgery the possibility of infection recurrence injury to arteries nerves tendons incomplete relief of symptoms and dystrophy. He is advised the potential for extension. He is advised the potential for loss of the finger. He is advised that joint may need to be released and if this is done he may not regain full flexibility of the PIP joint. He would like to proceed he is scheduled for fasciectomy left index finger as an outpatient under regional anesthesia. Questions are encouraged and answered to his satisfaction.      Nolita Kutter R 07/11/2016, 5:03 AM

## 2016-07-11 NOTE — Anesthesia Procedure Notes (Signed)
Anesthesia Regional Block:  Supraclavicular block  Pre-Anesthetic Checklist: ,, timeout performed, Correct Patient, Correct Site, Correct Laterality, Correct Procedure, Correct Position, site marked, Risks and benefits discussed,  Surgical consent,  Pre-op evaluation,  At surgeon's request and post-op pain management  Laterality: Left  Prep: chloraprep       Needles:  Injection technique: Single-shot  Needle Type: Echogenic Stimulator Needle     Needle Length: 5cm 5 cm Needle Gauge: 22 and 22 G    Additional Needles:  Procedures: ultrasound guided (picture in chart) Supraclavicular block Narrative:  Start time: 07/11/2016 9:18 AM End time: 07/11/2016 9:28 AM Injection made incrementally with aspirations every 5 mL.  Performed by: Personally  Anesthesiologist: Catalina Gravel  Additional Notes: Functioning IV was confirmed and monitors were applied.  A 52mm 22ga Arrow echogenic stimulator needle was used. Sterile prep and drape, hand hygiene, and sterile gloves were used.  Negative aspiration and negative test dose prior to incremental administration of local anesthetic. The patient tolerated the procedure well.  Ultrasound guidance: relevent anatomy identified, needle position confirmed, local anesthetic spread visualized around nerve(s), vascular puncture avoided.  Image printed for medical record.

## 2016-07-11 NOTE — Anesthesia Postprocedure Evaluation (Signed)
Anesthesia Post Note  Patient: Peter Spencer  Procedure(s) Performed: Procedure(s) (LRB): FASCIECTOMY left index possible joint release (Left)  Patient location during evaluation: PACU Anesthesia Type: MAC and Regional Level of consciousness: awake and alert Pain management: pain level controlled Vital Signs Assessment: post-procedure vital signs reviewed and stable Respiratory status: spontaneous breathing, nonlabored ventilation, respiratory function stable and patient connected to nasal cannula oxygen Cardiovascular status: stable and blood pressure returned to baseline Anesthetic complications: no    Last Vitals:  Vitals:   07/11/16 1204 07/11/16 1243  BP: (!) 152/89 134/75  Pulse: 68 70  Resp: 14 16  Temp: 36.6 C 36.6 C    Last Pain:  Vitals:   07/11/16 1243  TempSrc: Oral  PainSc:                  Catalina Gravel

## 2016-07-11 NOTE — Op Note (Signed)
Dictation Number [id number]

## 2016-07-11 NOTE — Progress Notes (Signed)
Assisted Dr. Turk with left, ultrasound guided, supraclavicular block. Side rails up, monitors on throughout procedure. See vital signs in flow sheet. Tolerated Procedure well. 

## 2016-07-12 ENCOUNTER — Encounter (HOSPITAL_BASED_OUTPATIENT_CLINIC_OR_DEPARTMENT_OTHER): Payer: Self-pay | Admitting: Orthopedic Surgery

## 2016-07-15 NOTE — Op Note (Signed)
NAME:  Peter Spencer, Peter Spencer                ACCOUNT NO.:  MEDICAL RECORD NO.:  [id number]  LOCATION:                                 FACILITY:  PHYSICIAN:  Daryll Brod, M.D.       DATE OF BIRTH:  18-Jun-1961  DATE OF PROCEDURE:  07/11/2016 DATE OF DISCHARGE:                              OPERATIVE REPORT   PREOPERATIVE DIAGNOSIS:  Dupuytren's contracture, left index finger.  POSTOPERATIVE DIAGNOSIS:  Dupuytren's contracture, left index finger.  OPERATION:  Fasciectomy of Dupuytren's contracture, left index finger with joint release and V-Y advancements.  SURGEON:  Daryll Brod, M.D.  ANESTHESIA:  Supraclavicular block with sedation.  ANESTHESIOLOGIST:  Dr. Gifford Shave.  HISTORY:  The patient is a 55 year old male with a history of Dupuytren's contracture with a significant deformity to the PIP joint of approximately 80 degrees and in flexion, which is relatively fixed and he is admitted for fasciectomy.  He is aware that there is no guarantee to the surgery; the possibility of infection; recurrence of injury to arteries, nerves, tendons; incomplete relief of symptoms and dystrophy. In the preoperative area, the patient is seen, the extremity marked by both patient and surgeon, antibiotic given.  He is also aware that a 50% correction frequently is best that we are going to be able to obtain with release.  In the preoperative area, the patient is seen with the finger marked.  PROCEDURE IN DETAIL:  The patient was brought to the operating room after a supraclavicular block was carried out in the preoperative area by Dr. Gifford Shave.  He was prepped using ChloraPrep in supine position with the left arm free.  A 3-minute dry time was allowed and time-out taken confirming the patient and procedure.  A volar Bruner incision was made, carried down through subcutaneous tissue.  This began in the proximal palm, carried distally to the PIP joint level.  Neurovascular bundles were identified proximally.   The cord was identified.  This was traced distally and excised inserting primarily into the skin at the metacarpophalangeal joint level.  Both radial and ulnar neurovascular bundles were identified and protected throughout the procedure.  The cords were then from the first dorsal interosseous and the first palmar interosseous radially and ulnarly.  These formed large marked sizeable lateral digital sheaths on each side of the finger.  There was a central cord, which was also present.  With blunt sharp dissection, the cords were dissected free from the overlying skin flaps and the neurovascular bundles were identified and protected throughout the procedure.  The insertion of the proximal middle phalanx was then identified on both radial and ulnar aspects and these were excised.  This allowed the PIP joint to come to approximately a 25-degree flexion position.  The flexor sheath was then released along with the check rein ligaments on both radial and ulnar aspect of the proximal portion of the volar plate at the PIP joint.  This allowed the index finger PIP joint to fully extend. The wound was copiously irrigated with saline.  The neurovascular bundles were traced along their entire course and found to be intact. After irrigation, the thrombin was placed in the wound.  A doubled  over vessel loop was placed.  These were converted to Y's and the wound closed with interrupted 4-0 nylon sutures over the doubled over vessel loop as a drain.  A sterile compressive dressing was applied.  The tourniquet was deflated.  The index finger immediately pinked along with the remaining fingers.  A dorsal splint was applied.  The dressing completed, and he was taken to the recovery room for observation in satisfactory condition.  He will be discharged to home to return to the [place] in 1 week, on [place].          ______________________________ Daryll Brod, M.D.     GK/MEDQ  D:   07/11/2016  T:  07/12/2016  Job:  [id number]

## 2016-10-22 ENCOUNTER — Encounter: Payer: Self-pay | Admitting: Cardiology

## 2017-10-31 ENCOUNTER — Institutional Professional Consult (permissible substitution): Payer: Commercial Managed Care - HMO | Admitting: Pulmonary Disease

## 2017-12-11 ENCOUNTER — Institutional Professional Consult (permissible substitution): Payer: 59 | Admitting: Pulmonary Disease

## 2018-01-12 ENCOUNTER — Ambulatory Visit (INDEPENDENT_AMBULATORY_CARE_PROVIDER_SITE_OTHER): Payer: 59 | Admitting: Pulmonary Disease

## 2018-01-12 ENCOUNTER — Encounter: Payer: Self-pay | Admitting: Pulmonary Disease

## 2018-01-12 DIAGNOSIS — G4733 Obstructive sleep apnea (adult) (pediatric): Secondary | ICD-10-CM | POA: Insufficient documentation

## 2018-01-12 NOTE — Assessment & Plan Note (Addendum)
We discussed the implications of bruxism and mild sleep apnea and their effect on sleep quality.  Unclear at this point which is affecting his sleep quality more. Simple biteguard has not helped  Trial of sleep number bed   Referral to Dr. Oneal Grout to see if we can custom make an oral appliance to target both  call me back in 6 months, if symptoms are persistent we can consider attended polysomnogram to see if bruxism is really an issue and causing arousals  Cardiovascular implications of mild OSA are minimal

## 2018-01-12 NOTE — Progress Notes (Signed)
   Subjective:    Patient ID: Peter Spencer, male    DOB: October 23, 1960, 57 y.o.   MRN: [id number]  HPI    Review of Systems  Constitutional: Negative for fever and unexpected weight change.  HENT: Negative for congestion, dental problem, ear pain, nosebleeds, postnasal drip, rhinorrhea, sinus pressure, sneezing, sore throat and trouble swallowing.   Eyes: Negative for redness and itching.  Respiratory: Positive for shortness of breath. Negative for cough, chest tightness and wheezing.   Cardiovascular: Negative for palpitations and leg swelling.  Gastrointestinal: Negative for nausea and vomiting.  Genitourinary: Negative for dysuria.  Musculoskeletal: Negative for joint swelling.  Skin: Negative for rash.  Allergic/Immunologic: Positive for environmental allergies. Negative for food allergies and immunocompromised state.  Neurological: Negative for headaches.  Hematological: Does not bruise/bleed easily.  Psychiatric/Behavioral: Negative for dysphoric mood. The patient is nervous/anxious.        Objective:   Physical Exam        Assessment & Plan:

## 2018-01-12 NOTE — Patient Instructions (Signed)
We discussed the implications of bruxism and mild sleep apnea and their effect on sleep quality.  Trial of sleep number bed   Referral to Dr. Oneal Grout to see if we can custom make an oral appliance to target both  call me back in 6 months

## 2018-01-12 NOTE — Progress Notes (Signed)
Subjective:    Patient ID: Peter Spencer, male    DOB: [birth year], 57 y.o.   MRN: [id number]  HPI  Chief Complaint  Patient presents with  . Sleep Consult    Referred by Dr. Wynonia Lawman for possible OSA. Had a SS back in 2017. Has a history of bruxism as well.  States he feels like he doesn't sleep at all at night.     57 year old man presents for evaluation of sleep disordered breathing and bruxism. He reports teeth grinding for most of his adult life, he has used a bite guard in the past made by his dentist Dr. Delilah Shan, unfortunately this only prevented his teeth from wear and tear but did not take the bruxism away.  He has read a lot about bruxism and has even considered Botox injections.  He is also tried massage therapy.    Wife has reported snoring especially in the first half of the night. He feels that his tiredness has worsened over the last 2 years since he had a home sleep test. He now wakes up in the middle of night occasionally and will use a short acting Ambien-sublingual formulation.  He also reports allergies Has undergone allergy testing and takes Allegra during spring and fall.  Epworth sleepiness score is 4. Bedtime is between 10:11 PM, sleep latency is a few minutes, he is asleep on his stomach but now sleeps on his side with one pillow, reports 2-3 nocturnal awakenings including nocturia and is out of bed by 6:45 PM well rested without headaches or dryness of mouth  There is no history suggestive of cataplexy, sleep paralysis or parasomnias  He sees cardiology for left bundle branch block and hypertension, we reviewed home sleep test done 2 years ago. He works out religiously every day and uses a caffeine drink to help improve his workouts. He works as a Engineering geologist, drinks alcohol about 1 drink per day before his dinner  Significant tests/ events reviewed  HST 01/2016 AHI 8/h with mild oxygen desaturation    Past Medical History:  Diagnosis Date    . Anxiety   . GERD (gastroesophageal reflux disease)   . Hypertension    no meds now  . Hypothyroidism   . Left bundle branch block   . SOB (shortness of breath)   . Thyroid disease      Past Surgical History:  Procedure Laterality Date  . FASCIECTOMY Left 07/11/2016   Procedure: FASCIECTOMY left index possible joint release;  Surgeon: Daryll Brod, MD;  Location: [place];  Service: Orthopedics;  Laterality: Left;  FASCIECTOMY left index possible joint release  . KNEE SURGERY    . TONSILLECTOMY      Allergies  Allergen Reactions  . Sulfa Antibiotics Nausea And Vomiting  . Tetracyclines & Related Other (See Comments)    Caused liver enzymes to go up    Social History   Socioeconomic History  . Marital status: Married    Spouse name: Not on file  . Number of children: Not on file  . Years of education: Not on file  . Highest education level: Not on file  Occupational History  . Not on file  Social Needs  . Financial resource strain: Not on file  . Food insecurity:    Worry: Not on file    Inability: Not on file  . Transportation needs:    Medical: Not on file    Non-medical: Not on file  Tobacco Use  . Smoking status:  Never Smoker  . Smokeless tobacco: Never Used  Substance and Sexual Activity  . Alcohol use: Yes    Comment: one glass of wine 4 nights a week  . Drug use: No  . Sexual activity: Not on file  Lifestyle  . Physical activity:    Days per week: Not on file    Minutes per session: Not on file  . Stress: Not on file  Relationships  . Social connections:    Talks on phone: Not on file    Gets together: Not on file    Attends religious service: Not on file    Active member of club or organization: Not on file    Attends meetings of clubs or organizations: Not on file    Relationship status: Not on file  . Intimate partner violence:    Fear of current or ex partner: Not on file    Emotionally abused: Not on file    Physically  abused: Not on file    Forced sexual activity: Not on file  Other Topics Concern  . Not on file  Social History Narrative  . Not on file     Family History  Problem Relation Age of Onset  . CAD Other   . Stroke Neg Hx   . Diabetes Mellitus II Neg Hx      Review of Systems Positive for shortness of breath during workouts, bruxism, sneezing due to seasonal allergies, anxiety  Constitutional: negative for anorexia, fevers and sweats  Eyes: negative for irritation, redness and visual disturbance  Ears, nose, mouth, throat, and face: negative for earaches, epistaxis, nasal congestion and sore throat  Respiratory: negative for cough, dyspnea on exertion, sputum and wheezing  Cardiovascular: negative for chest pain,  lower extremity edema, orthopnea, palpitations and syncope  Gastrointestinal: negative for abdominal pain, constipation, diarrhea, melena, nausea and vomiting  Genitourinary:negative for dysuria, frequency and hematuria  Hematologic/lymphatic: negative for bleeding, easy bruising and lymphadenopathy  Musculoskeletal:negative for arthralgias, muscle weakness and stiff joints  Neurological: negative for coordination problems, gait problems, headaches and weakness  Endocrine: negative for diabetic symptoms including polydipsia, polyuria and weight loss     Objective:   Physical Exam  Gen. Pleasant,tall,thin, in no distress ENT - no thrush, no post nasal drip, class 2 airway Neck: No JVD, no thyromegaly, no carotid bruits Lungs: no use of accessory muscles, no dullness to percussion, clear without rales or rhonchi  Cardiovascular: Rhythm regular, heart sounds  normal, no murmurs or gallops, no peripheral edema Musculoskeletal: No deformities, no cyanosis or clubbing        Assessment & Plan:

## 2018-06-28 DIAGNOSIS — H3320 Serous retinal detachment, unspecified eye: Secondary | ICD-10-CM

## 2018-06-28 HISTORY — DX: Serous retinal detachment, unspecified eye: H33.20

## 2018-06-28 HISTORY — PX: RETINAL DETACHMENT SURGERY: SHX105

## 2018-07-24 ENCOUNTER — Telehealth: Payer: Self-pay | Admitting: Cardiology

## 2018-07-24 MED ORDER — ROSUVASTATIN CALCIUM 20 MG PO TABS: 20.0000 mg | ORAL_TABLET | ORAL | 0 refills | Status: DC

## 2018-07-24 NOTE — Telephone Encounter (Signed)
° ° ° °  1. Which medications need to be refilled? (please list name of each medication and dose if known) Rosuvastatin 20mg  tablet twice weekly  2. Which pharmacy/location (including street and city if local pharmacy) is medication to be sent to? [facility] [address] [place]  3. Do they need a 30 day or 90 day supply? [place]

## 2018-07-24 NOTE — Telephone Encounter (Signed)
Refill for rosuvastatin sent to [facility] in [facility] as requested. Patient is due for follow up in March 2020. Will have front desk contact patient to schedule an appointment.

## 2018-10-22 ENCOUNTER — Telehealth: Payer: Self-pay | Admitting: Emergency Medicine

## 2018-10-22 ENCOUNTER — Other Ambulatory Visit: Payer: Self-pay

## 2018-10-22 MED ORDER — ROSUVASTATIN CALCIUM 20 MG PO TABS: 20.0000 mg | ORAL_TABLET | ORAL | 1 refills | Status: DC

## 2018-10-22 NOTE — Telephone Encounter (Signed)
Left message for patient to return call regarding switching visit to a televisit.  

## 2018-10-23 DIAGNOSIS — E785 Hyperlipidemia, unspecified: Secondary | ICD-10-CM

## 2018-10-23 DIAGNOSIS — K219 Gastro-esophageal reflux disease without esophagitis: Secondary | ICD-10-CM

## 2018-10-23 DIAGNOSIS — I2510 Atherosclerotic heart disease of native coronary artery without angina pectoris: Secondary | ICD-10-CM

## 2018-10-23 HISTORY — DX: Atherosclerotic heart disease of native coronary artery without angina pectoris: I25.10

## 2018-10-23 HISTORY — DX: Gastro-esophageal reflux disease without esophagitis: K21.9

## 2018-10-26 ENCOUNTER — Encounter: Payer: Self-pay | Admitting: Cardiology

## 2018-10-26 ENCOUNTER — Telehealth (INDEPENDENT_AMBULATORY_CARE_PROVIDER_SITE_OTHER): Payer: 59 | Admitting: Cardiology

## 2018-10-26 ENCOUNTER — Other Ambulatory Visit: Payer: Self-pay

## 2018-10-26 ENCOUNTER — Telehealth: Payer: Self-pay | Admitting: Emergency Medicine

## 2018-10-26 VITALS — BP 131/79 | HR 78 | Ht 75.0 in | Wt 174.0 lb

## 2018-10-26 DIAGNOSIS — I160 Hypertensive urgency: Secondary | ICD-10-CM | POA: Diagnosis not present

## 2018-10-26 DIAGNOSIS — I447 Left bundle-branch block, unspecified: Secondary | ICD-10-CM | POA: Diagnosis not present

## 2018-10-26 DIAGNOSIS — E782 Mixed hyperlipidemia: Secondary | ICD-10-CM | POA: Diagnosis not present

## 2018-10-26 DIAGNOSIS — I2510 Atherosclerotic heart disease of native coronary artery without angina pectoris: Secondary | ICD-10-CM

## 2018-10-26 MED ORDER — ZOLPIDEM TARTRATE 3.5 MG SL SUBL: 1.0000 | SUBLINGUAL_TABLET | Freq: Every day | SUBLINGUAL | 2 refills | Status: DC

## 2018-10-26 MED ORDER — ZOLPIDEM TARTRATE 3.5 MG SL SUBL: 1.0000 | SUBLINGUAL_TABLET | Freq: Every day | SUBLINGUAL | 3 refills | Status: DC

## 2018-10-26 MED ORDER — ROSUVASTATIN CALCIUM 20 MG PO TABS: 20.0000 mg | ORAL_TABLET | ORAL | 1 refills | Status: DC

## 2018-10-26 NOTE — Telephone Encounter (Signed)
Left message for patient to return call to schedule 6 month virtual follow up appointment.

## 2018-10-26 NOTE — Telephone Encounter (Signed)
Patient will need to be rescheduled for a echocardiogram per Dr. Agustin Cree.

## 2018-10-26 NOTE — Patient Instructions (Signed)
Medication Instructions:  Your physician recommends that you continue on your current medications as directed. Please refer to the Current Medication list given to you today.  If you need a refill on your cardiac medications before your next appointment, please call your pharmacy.   Lab work: Your physician recommends that you return for lab work within the next wee: lipids  If you have labs (blood work) drawn today and your tests are completely normal, you will receive your results only by: Marland Kitchen MyChart Message (if you have MyChart) OR . A paper copy in the mail If you have any lab test that is abnormal or we need to change your treatment, we will call you to review the results.  Testing/Procedures: Your physician has requested that you have an echocardiogram. Echocardiography is a painless test that uses sound waves to create images of your heart. It provides your doctor with information about the size and shape of your heart and how well your heart's chambers and valves are working. This procedure takes approximately one hour. There are no restrictions for this procedure.    Follow-Up: At [facility], you and your health needs are our priority.  As part of our continuing mission to provide you with exceptional heart care, we have created designated Provider Care Teams.  These Care Teams include your primary Cardiologist (physician) and Advanced Practice Providers (APPs -  Physician Assistants and Nurse Practitioners) who all work together to provide you with the care you need, when you need it. You will need a follow up appointment in 6 months.  Please call our office 2 months in advance to schedule this appointment.  You may see No primary care provider on file. or another member of our [employer] Provider Team in [place]: Shirlee More, MD . Jyl Heinz, MD  Any Other Special Instructions Will Be Listed Below (If Applicable).

## 2018-10-26 NOTE — Progress Notes (Signed)
Virtual Visit via Video Note    Evaluation Performed:  Follow-up visit  This visit type was conducted due to national recommendations for restrictions regarding the COVID-19 Pandemic (e.g. social distancing).  This format is felt to be most appropriate for this patient at this time.  All issues noted in this document were discussed and addressed.  No physical exam was performed (except for noted visual exam findings with Video Visits).  Please refer to the patient's chart (MyChart message for video visits and phone note for telephone visits) for the patient's consent to telehealth for [facility].  Date:  10/26/2018  ID: Peter Spencer, DOB January 07, 1961, MRN [id number]   Patient Location:  [place] [address]   Provider location:   [place] Office  PCP:  Patient, No Pcp Per  Cardiologist:  Jenne Campus, MD     Chief Complaint: Just regular follow-up  History of Present Illness:    Peter Spencer is a 58 y.o. male  who presents via audio/video conferencing for a telehealth visit today.  With a history of left bundle branch block, ejection fraction 5055% by echocardiogram in 2017.  Negative stress test in March 2017.  Overall seems to be doing well.  This is just regular follow-up office visit.  He is patient of Dr. Wynonia Lawman.  He had some problem with the eye that eventually resulted in eye surgery.  Because of this he could not exercise for the last few months.  Apparently he will be able to go back to regular schedule exercises in May.  Denies having any problems no shortness of breath chest pain tightness squeezing pressure burning chest no swelling of lower extremities no palpitations no dizziness.  He was very knowledgeable about his problems he understood with left bundle branch block is I explained to him one more time.  We also talk about exercises on the regular basis that he will start starting May.  He does not have any signs and symptoms  of COVID-19 infection.  He is working from home and he is practicing social distancing.  We also talk about cholesterol.  He takes Crestor twice a week only we will put him on the schedule have his cholesterol checked.   The patient does not symptoms concerning for COVID-19 infection (fever, chills, cough, or new SHORTNESS OF BREATH).    Prior CV studies:   The following studies were reviewed today:  His echocardiogram results were reviewed.  Also stress test from March 2017.  Ejection fraction 54% from 2017     Past Medical History:  Diagnosis Date  . Anxiety   . GERD (gastroesophageal reflux disease)   . Hypertension    no meds now  . Hypothyroidism   . Left bundle branch block   . SOB (shortness of breath)   . Thyroid disease     Past Surgical History:  Procedure Laterality Date  . FASCIECTOMY Left 07/11/2016   Procedure: FASCIECTOMY left index possible joint release;  Surgeon: Daryll Brod, MD;  Location: [place];  Service: Orthopedics;  Laterality: Left;  FASCIECTOMY left index possible joint release  . KNEE SURGERY    . NASAL FRACTURE SURGERY    . TOE SURGERY    . TONSILLECTOMY       No outpatient medications have been marked as taking for the 10/26/18 encounter (Appointment) with Park Liter, MD.      Family History: The patient's family history includes Atrial fibrillation in his father;  CAD in an other family member; Dementia in his father; Pancreatic cancer in his mother; Parkinsonism in his father. There is no history of Stroke or Diabetes Mellitus II.   ROS:   Please see the history of present illness.     All other systems reviewed and are negative.   Labs/Other Tests and Data Reviewed:     Recent Labs: No results found for requested labs within last 8760 hours.  Recent Lipid Panel No results found for: CHOL, TRIG, HDL, CHOLHDL, VLDL, LDLCALC, LDLDIRECT    Exam:    Vital Signs:  Wt 141 lb (64 kg)   BMI 24.20 kg/m     Well nourished, well developed male in no acute distress. Alert awake oriented x3 quite cheerful we had very nice conversation together.  Diagnosis for this visit:   1. LBBB (left bundle branch block)   2. Hypertensive urgency   3. CAD in native artery   4. Mixed hyperlipidemia      ASSESSMENT & PLAN:    1.  Left bundle branch block.  Chronic problem.  No new symptoms.  He deserves to have an echocardiogram however because of coronavirus situation will postpone the test until situation will be more stable. 2.  Essential hypertension blood pressure well controlled with all medications throughout continue. 3.  Coronary artery disease stable asymptomatic was able to exercise on a regular basis with no difficulty continue with aspirin and statin. 4.  Dyslipidemia.  He is taking Crestor only twice a week.  Apparently he had some issue taking higher dosages.  We will schedule him to have fasting lipid profile done I see last cholesterol from 2017 with HDL of 72 and LDL of 90.  We talked about healthy lifestyle and exercises on a regular basis as well as the fact that he need to stick with Mediterranean diet he already understood that is and he is very knowledgeable about this.  COVID-19 Education: The signs and symptoms of COVID-19 were discussed with the patient and how to seek care for testing (follow up with PCP or arrange E-visit).  The importance of social distancing was discussed today.  Patient Risk:   After full review of this patients clinical status, I feel that they are at least moderate risk at this time.  Time:   Today, I have spent 25 minutes with the patient with telehealth technology discussing pt health issues.     Medication Adjustments/Labs and Tests Ordered: Current medicines are reviewed at length with the patient today.  Concerns regarding medicines are outlined above.  No orders of the defined types were placed in this encounter.  Medication changes: No orders of  the defined types were placed in this encounter.    Disposition: Follow-up in 6 months he will be scheduled to have echocardiogram as well as a fasting lipid profile acuity level is 3  Signed, Park Liter, MD, [facility] 10/26/2018 9:23 AM    [place]

## 2018-11-09 NOTE — Telephone Encounter (Signed)
Patient's testing has been put in for a recall

## 2019-04-16 ENCOUNTER — Ambulatory Visit: Payer: 59 | Admitting: Cardiology

## 2019-06-10 ENCOUNTER — Ambulatory Visit (INDEPENDENT_AMBULATORY_CARE_PROVIDER_SITE_OTHER): Payer: 59 | Admitting: Family

## 2019-06-10 ENCOUNTER — Encounter: Payer: Self-pay | Admitting: Family

## 2019-06-10 ENCOUNTER — Other Ambulatory Visit: Payer: Self-pay

## 2019-06-10 VITALS — BP 138/70 | HR 89 | Resp 18 | Ht 74.0 in | Wt 183.8 lb

## 2019-06-10 DIAGNOSIS — I10 Essential (primary) hypertension: Secondary | ICD-10-CM

## 2019-06-10 DIAGNOSIS — K219 Gastro-esophageal reflux disease without esophagitis: Secondary | ICD-10-CM | POA: Diagnosis not present

## 2019-06-10 DIAGNOSIS — E782 Mixed hyperlipidemia: Secondary | ICD-10-CM

## 2019-06-10 DIAGNOSIS — I2510 Atherosclerotic heart disease of native coronary artery without angina pectoris: Secondary | ICD-10-CM

## 2019-06-10 MED ORDER — ROSUVASTATIN CALCIUM 20 MG PO TABS: 20.0000 mg | ORAL_TABLET | ORAL | 1 refills | Status: DC

## 2019-06-10 MED ORDER — LOSARTAN POTASSIUM 25 MG PO TABS: 25.0000 mg | ORAL_TABLET | Freq: Every day | ORAL | 0 refills | Status: DC

## 2019-06-10 MED ORDER — PANTOPRAZOLE SODIUM 20 MG PO TBEC: 20.0000 mg | DELAYED_RELEASE_TABLET | Freq: Every day | ORAL | 0 refills | Status: DC

## 2019-06-10 NOTE — Progress Notes (Signed)
Office Visit    Patient Name: Peter Spencer Date of Encounter: 06/10/2019  Primary Care Provider:  Bobbye Riggs, MD Primary Cardiologist:  Jenne Campus, MD Electrophysiologist:  None   Chief Complaint    Peter Spencer is a 58 y.o. male with a hx of CAD, LBBB, HTN, EF 50 to 55% by echo, hypothyroidism, HLD presents today for trouble with his blood pressure.  Past Medical History    Past Medical History:  Diagnosis Date  . Anxiety   . GERD (gastroesophageal reflux disease)   . Hypertension    no meds now  . Hypothyroidism   . Left bundle branch block   . SOB (shortness of breath)   . Thyroid disease    Past Surgical History:  Procedure Laterality Date  . FASCIECTOMY Left 07/11/2016   Procedure: FASCIECTOMY left index possible joint release;  Surgeon: Daryll Brod, MD;  Location: [place];  Service: Orthopedics;  Laterality: Left;  FASCIECTOMY left index possible joint release  . KNEE SURGERY    . NASAL FRACTURE SURGERY    . TOE SURGERY    . TONSILLECTOMY      Allergies  Allergies  Allergen Reactions  . Dust Mite Extract   . Mold Extract [Trichophyton]   . Sulfa Antibiotics Nausea And Vomiting  . Tetracyclines & Related Other (See Comments)    Caused liver enzymes to go up    History of Present Illness    [name] is a 58 y.o. male with a hx of CAD, LBBB, HTN, EF 50 to 55% by echo, hypothyroidism, HLD  last seen 10/26/18 by Dr. Agustin Cree. His CAD is notable for prior CTA with mild-moderate stenosis in LAD per Dr. Thurman Coyer records. He had a negative stress test 09/2015.   Presents today for follow up. 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 He denies chest pain, pressure, tightness. Has been staying active by exercising at least 4 times per week. Endorses trying to eat a heart healthy diet and avoid salt.   He endorses recent stress. Tells me his mother in law is in hospice  care, offered my condolences. Tells me it has been a busy and stressful season at work. He is concerned as he keeps having the sensation of being "hot" in his head which he previously experienced with elevated BP. Was previously on Losartan 61m daily by Dr. TWynonia Lawmanwhich was stopped when he he checked his blood pressure intermittently at home with arm cuff with readings greater than 1591systolic.  Also endorses he is having acid reflux that is somewhat resolved by omeprazole but occasionally requires twice daily dosing.  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 The cavity    size was normal. Wall thickness was increased in a pattern    of mild LVH. The estimated ejection fraction was 60%. Wall    motion was normal; there were no regional wall motion    abnormalities.  - Right ventricle: The cavity size was normal. Systolic    function was normal.  Transthoracic echocardiography.  M-mode, complete 2D,  spectral Doppler, and color Doppler.  Height:  Height:  205.7cm. Height: 81in.  Weight:  Weight: 78.9kg. Weight:  173.6lb.  Body mass index:  BMI: 18.6kg/m^2.  Body surface  area:    BSA: 2.195m.  Blood pressure:     137/79.  Patient  status:  Inpatient.  Location:  Echo laboratory.   EKG:  EKG is ordered today.  The ekg ordered today demonstrates SR rate 91 bpm LBBB.  Recent Labs: No results found for requested labs within last 8760 hours.  Recent Lipid Panel No results found for: CHOL, TRIG, HDL, CHOLHDL, VLDL, LDLCALC, LDLDIRECT  Home Medications   Current Meds  Medication Sig  . aspirin EC 81 MG tablet Take 81 mg by mouth daily.  . fexofenadine (ALLEGRA) 180 MG tablet Take 180 mg by mouth at bedtime.  . fluticasone (FLONASE) 50 MCG/ACT nasal spray Place into both nostrils as needed for allergies or rhinitis.  Marland Kitchen levothyroxine (SYNTHROID, LEVOTHROID) 100 MCG tablet Take 100 mcg by mouth  daily before breakfast.  . magnesium oxide (MAG-OX) 400 MG tablet Take 400 mg by mouth daily.  . Multiple Vitamin (MULTIVITAMIN WITH MINERALS) TABS tablet Take 1 tablet by mouth daily.  . rosuvastatin (CRESTOR) 20 MG tablet Take 1 tablet (20 mg total) by mouth 4 (four) times a week.  . Zolpidem Tartrate (INTERMEZZO) 3.5 MG SUBL Place 1 tablet under the tongue at bedtime.  . [DISCONTINUED] rosuvastatin (CRESTOR) 20 MG tablet Take 1 tablet (20 mg total) by mouth 2 (two) times a week. Only taking on Mondays and Thursdays mornings per patient.      Review of Systems      Review of Systems  Constitution: Negative for chills, fever and malaise/fatigue.  Cardiovascular: Negative for chest pain, dyspnea on exertion, irregular heartbeat, leg swelling, near-syncope and palpitations.       Feels "hot" with elevated BP  Respiratory: Negative for cough, shortness of breath and wheezing.   Gastrointestinal: Negative for nausea and vomiting.  Neurological: Positive for light-headedness (intermittent). Negative for dizziness and weakness.  Psychiatric/Behavioral:       (+) stress   All other systems reviewed and are otherwise negative except as noted above.  Physical Exam    VS:  Pulse 89   Resp 18   Ht 6' 2"  (1.88 m)   Wt 183 lb 12 oz (83.3 kg)   SpO2 100%   BMI 23.59 kg/m  , BMI Body mass index is 23.59 kg/m. GEN: Well nourished, well developed, in no acute distress. HEENT: normal. Neck: Supple, no JVD, carotid bruits, or masses. Cardiac: RRR, no murmurs, rubs, or gallops. No clubbing, cyanosis, edema.  Radials/DP/PT 2+ and equal bilaterally.  Respiratory:  Respirations regular and unlabored, clear to auscultation bilaterally. GI: Soft, nontender, nondistended, BS + x 4. MS: No deformity or atrophy. Skin: Warm and dry, no rash. Neuro:  Strength and sensation are intact. Psych: Normal affect.  Accessory Clinical Findings    ECG personally reviewed by me today - SR with known LBBB - no  acute changes.  Assessment & Plan    1. Mild nonobstructive CAD -mild nonobstructive CAD on previous CT scan.  Lexiscan 2017 with no evidence of ischemia.  Stable with no anginal symptoms.  Continue GDMT aspirin, statin.  Tells me he previously had problems with bradycardia-would not initiate met beta-blocker today.  2. HTN -elevated BP reading at home greater than 761 systolic.  Endorses recent stress, likely etiology of increased blood pressure.  Blood pressure elevated today on exam.  He will check for 1 week home with arm cuff.  If blood pressures consistently greater than 130/80 he will start losartan 25 mg daily.  3. GERD -difficult to control with omeprazole.  Start pantoprazole 20 mg daily.  4. HLD -discussed LDL goal of less than 70.  LDL by  PCP 12/2018 was 103.  He will increase his Crestor from 2 times weekly to 4 times weekly.  Reports no history of myalgia.  C-Met and lipid panel in 6 weeks.  If not at goal consider increased frequency to daily and/or add Zetia 10 mg daily.  Disposition: Follow up in 2 month(s) with Dr. Cathie Beams, NP 06/10/2019, 3:20 PM

## 2019-06-10 NOTE — Patient Instructions (Signed)
Medication Instructions:  Your physician has recommended you make the following change in your medication:   START Pantoprazole 20 mg daily  START Losartan 25 mg (hold for systolic blood pressure 0000000)  CHANGE Rosuvastatin to 4 times per week  *If you need a refill on your cardiac medications before your next appointment, please call your pharmacy*  Lab Work: Your physician recommends that you return for lab work in: 6 weeks  Swing by the office the week of Christmas or the week after. You do not need an appointment. Please come 8am - 4:30pm but avoid 12p-1p.   If you have labs (blood work) drawn today and your tests are completely normal, you will receive your results only by: Marland Kitchen MyChart Message (if you have MyChart) OR . A paper copy in the mail If you have any lab test that is abnormal or we need to change your treatment, we will call you to review the results.  Testing/Procedures: You had an EKG today.  Follow-Up: At [facility], you and your health needs are our priority.  As part of our continuing mission to provide you with exceptional heart care, we have created designated Provider Care Teams.  These Care Teams include your primary Cardiologist (physician) and Advanced Practice Providers (APPs -  Physician Assistants and Nurse Practitioners) who all work together to provide you with the care you need, when you need it.  Your next appointment:   2 months  The format for your next appointment:   In Person  Provider:   Jenne Campus, MD  Other Instructions Tips to Measure your Blood Pressure Correctly  To determine whether you have hypertension, a medical professional will take a blood pressure reading. How you prepare for the test, the position of your arm, and other factors can change a blood pressure reading by 10% or more. That could be enough to hide high blood pressure, start you on a drug you don't really need, or lead your doctor to incorrectly adjust your  medications.  National and international guidelines offer specific instructions for measuring blood pressure. If a doctor, nurse, or medical assistant isn't doing it right, don't hesitate to ask him or her to get with the guidelines.  Here's what you can do to ensure a correct reading: . Don't drink a caffeinated beverage or smoke during the 30 minutes before the test. . Sit quietly for five minutes before the test begins. . During the measurement, sit in a chair with your feet on the floor and your arm supported so your elbow is at about heart level. . The inflatable part of the cuff should completely cover at least 80% of your upper arm, and the cuff should be placed on bare skin, not over a shirt. . Don't talk during the measurement. . Have your blood pressure measured twice, with a brief break in between. If the readings are different by 5 points or more, have it done a third time.  There are times to break these rules. If you sometimes feel lightheaded when getting out of bed in the morning or when you stand after sitting, you should have your blood pressure checked while seated and then while standing to see if it falls from one position to the next.  Because blood pressure varies throughout the day, your doctor will rarely diagnose hypertension on the basis of a single reading. Instead, he or she will want to confirm the measurements on at least two occasions, usually within a few weeks of  one another. The exception to this rule is if you have a blood pressure reading of 180/110 mm Hg or higher. A result this high usually calls for prompt treatment.  It's a good idea to have your blood pressure measured in both arms at least once, since the reading in one arm (usually the right) may be higher than that in the left. A 2014 study in The American Journal of Medicine of nearly 3,400 people found average arm- to-arm differences in systolic blood pressure of about 5 points. The higher number should  be used to make treatment decisions.  In 2017, new guidelines from the [place], the [employer] of Cardiology, and nine other health organizations lowered the diagnosis of high blood pressure to 130/80 mm Hg or higher for all adults. The guidelines also redefined the various blood pressure categories to now include normal, elevated, Stage 1 hypertension, Stage 2 hypertension, and hypertensive crisis (see "Blood pressure categories").  Blood pressure categories  Blood pressure category SYSTOLIC (upper number)  DIASTOLIC (lower number)  Normal Less than 120 mm Hg and Less than 80 mm Hg  Elevated 120-129 mm Hg and Less than 80 mm Hg  High blood pressure: Stage 1 hypertension 130-139 mm Hg or 80-89 mm Hg  High blood pressure: Stage 2 hypertension 140 mm Hg or higher or 90 mm Hg or higher  Hypertensive crisis (consult your doctor immediately) Higher than 180 mm Hg and/or Higher than 120 mm Hg  Source: American Heart Association and American Stroke Association. For more on getting your blood pressure under control, buy Controlling Your Blood Pressure, a Special Health Report from [facility].   Blood Pressure Log   Date   Time  Blood Pressure  Position  Example: Nov 1 9 AM 124/78 sitting

## 2019-06-16 ENCOUNTER — Ambulatory Visit: Payer: 59 | Admitting: Cardiology

## 2019-08-18 ENCOUNTER — Other Ambulatory Visit: Payer: Self-pay | Admitting: Cardiology

## 2019-08-19 ENCOUNTER — Ambulatory Visit: Payer: 59 | Admitting: Cardiology

## 2019-08-19 LAB — LIPID PANEL
Chol/HDL Ratio: 2.9 ratio (ref 0.0–5.0)
Cholesterol, Total: 163 mg/dL (ref 100–199)
HDL: 57 mg/dL (ref >39)
LDL Chol Calc (NIH): 92 mg/dL (ref 0–99)
Triglycerides: 71 mg/dL (ref 0–149)
VLDL Cholesterol Cal: 14 mg/dL (ref 5–40)

## 2019-08-23 ENCOUNTER — Other Ambulatory Visit: Payer: Self-pay

## 2019-08-23 ENCOUNTER — Ambulatory Visit (INDEPENDENT_AMBULATORY_CARE_PROVIDER_SITE_OTHER): Payer: 59 | Admitting: Cardiology

## 2019-08-23 ENCOUNTER — Other Ambulatory Visit: Payer: Self-pay | Admitting: Cardiology

## 2019-08-23 ENCOUNTER — Encounter: Payer: Self-pay | Admitting: Cardiology

## 2019-08-23 VITALS — BP 142/76 | HR 95 | Ht 74.0 in | Wt 186.0 lb

## 2019-08-23 DIAGNOSIS — E782 Mixed hyperlipidemia: Secondary | ICD-10-CM | POA: Diagnosis not present

## 2019-08-23 DIAGNOSIS — I447 Left bundle-branch block, unspecified: Secondary | ICD-10-CM

## 2019-08-23 DIAGNOSIS — I10 Essential (primary) hypertension: Secondary | ICD-10-CM

## 2019-08-23 DIAGNOSIS — I2510 Atherosclerotic heart disease of native coronary artery without angina pectoris: Secondary | ICD-10-CM | POA: Diagnosis not present

## 2019-08-23 MED ORDER — ROSUVASTATIN CALCIUM 20 MG PO TABS: 20.0000 mg | ORAL_TABLET | Freq: Every day | ORAL | 3 refills | Status: DC

## 2019-08-23 NOTE — Progress Notes (Signed)
Cardiology Office Note:    Date:  08/23/2019   ID:  Peter Spencer, DOB 07/08/1961, MRN [id number]  PCP:  Bobbye Riggs, MD  Cardiologist:  Jenne Campus, MD    Referring MD: Peter Spencer*   Chief Complaint  Patient presents with  . Follow-up    History of Present Illness:    Peter Spencer is a 59 y.o. male with past medical history significant for coronary artery disease, years ago he had CT angio done of his coronary artery disease which showed mild to moderate stenosis of LAD.  He did have a stress test done in 2017 which was negative.  He does have also baseline left bundle branch block.  He comes today to my office to talk about his issues.  Overall he does have a lot of stressful situation at home.  His mother-in-law is actively dying at home.  They anticipate that she will be gone within the next 48hours that stressed him a lot and he looks stressed out.  Does exercise 3 times a week.  We talked a lot about how to do the relaxation technique with advised him to start doing yoga.  He is very eager to do that.  Cardiac wise doing well.  Denies have any chest pain tightness squeezing pressure burning chest no shortness of breath nothing unusual.  Past Medical History:  Diagnosis Date  . Anxiety   . GERD (gastroesophageal reflux disease)   . Hypertension    no meds now  . Hypothyroidism   . Left bundle branch block   . SOB (shortness of breath)   . Thyroid disease     Past Surgical History:  Procedure Laterality Date  . FASCIECTOMY Left 07/11/2016   Procedure: FASCIECTOMY left index possible joint release;  Surgeon: Peter Brod, MD;  Location: [place];  Service: Orthopedics;  Laterality: Left;  FASCIECTOMY left index possible joint release  . KNEE SURGERY    . NASAL FRACTURE SURGERY    . TOE SURGERY    . TONSILLECTOMY      Current Medications: Current Meds  Medication Sig  . aspirin EC 81 MG tablet Take 81 mg by mouth  daily.  . fexofenadine (ALLEGRA) 180 MG tablet Take 180 mg by mouth at bedtime.  . fluticasone (FLONASE) 50 MCG/ACT nasal spray Place into both nostrils as needed for allergies or rhinitis.  Marland Spencer levothyroxine (SYNTHROID, LEVOTHROID) 100 MCG tablet Take 100 mcg by mouth daily before breakfast.  . magnesium oxide (MAG-OX) 400 MG tablet Take 400 mg by mouth daily.  . Multiple Vitamin (MULTIVITAMIN WITH MINERALS) TABS tablet Take 1 tablet by mouth daily.  . pantoprazole (PROTONIX) 20 MG tablet Take 1 tablet (20 mg total) by mouth daily.  . Zolpidem Tartrate (INTERMEZZO) 3.5 MG SUBL Place 1 tablet under the tongue at bedtime.  . [DISCONTINUED] rosuvastatin (CRESTOR) 20 MG tablet Take 1 tablet (20 mg total) by mouth 4 (four) times a week.     Allergies:   Dust mite extract, Mold extract [trichophyton], Sulfa antibiotics, and Tetracyclines & related   Social History   Socioeconomic History  . Marital status: Married    Spouse name: Not on file  . Number of children: Not on file  . Years of education: Not on file  . Highest education level: Not on file  Occupational History  . Not on file  Tobacco Use  . Smoking status: Never Smoker  . Smokeless tobacco: Never Used  Substance and Sexual Activity  .  Alcohol use: Yes    Comment: one glass of wine 4 nights a week  . Drug use: No  . Sexual activity: Not on file  Other Topics Concern  . Not on file  Social History Narrative  . Not on file   Social Determinants of Health   Financial Resource Strain:   . Difficulty of Paying Living Expenses: Not on file  Food Insecurity:   . Worried About Charity fundraiser in the Last Year: Not on file  . Ran Out of Food in the Last Year: Not on file  Transportation Needs:   . Lack of Transportation (Medical): Not on file  . Lack of Transportation (Non-Medical): Not on file  Physical Activity:   . Days of Exercise per Week: Not on file  . Minutes of Exercise per Session: Not on file  Stress:   .  Feeling of Stress : Not on file  Social Connections:   . Frequency of Communication with Friends and Family: Not on file  . Frequency of Social Gatherings with Friends and Family: Not on file  . Attends Religious Services: Not on file  . Active Member of Clubs or Organizations: Not on file  . Attends Archivist Meetings: Not on file  . Marital Status: Not on file     Family History: The patient's family history includes Atrial fibrillation in his father; CAD in an other family member; Dementia in his father; Pancreatic cancer in his mother; Parkinsonism in his father. There is no history of Stroke or Diabetes Mellitus II. ROS:   Please see the history of present illness.    All 14 point review of systems negative except as described per history of present illness  EKGs/Labs/Other Studies Reviewed:      Recent Labs: No results found for requested labs within last 8760 hours.  Recent Lipid Panel    Component Value Date/Time   CHOL 163 08/18/2019 0827   TRIG 71 08/18/2019 0827   HDL 57 08/18/2019 0827   CHOLHDL 2.9 08/18/2019 0827   LDLCALC 92 08/18/2019 0827    Physical Exam:    VS:  BP (!) 142/76   Pulse 95   Ht 6\' 2"  (1.88 m)   Wt 186 lb (84.4 kg)   SpO2 97%   BMI 23.88 kg/m     Wt Readings from Last 3 Encounters:  08/23/19 186 lb (84.4 kg)  06/10/19 183 lb 12 oz (83.3 kg)  10/26/18 174 lb (78.9 kg)     GEN:  Well nourished, well developed in no acute distress HEENT: Normal NECK: No JVD; No carotid bruits LYMPHATICS: No lymphadenopathy CARDIAC: RRR, no murmurs, no rubs, no gallops RESPIRATORY:  Clear to auscultation without rales, wheezing or rhonchi  ABDOMEN: Soft, non-tender, non-distended MUSCULOSKELETAL:  No edema; No deformity  SKIN: Warm and dry LOWER EXTREMITIES: no swelling NEUROLOGIC:  Alert and oriented x 3 PSYCHIATRIC:  Normal affect   ASSESSMENT:    1. Mixed hyperlipidemia   2. Essential hypertension   3. Mild coronary artery  disease   4. LBBB (left bundle branch block)   5. CAD in native artery   6. Benign essential hypertension    PLAN:    In order of problems listed above:  1. Mixed dyslipidemia his fasting lipid profile is still not sufficiently controlled.  His LDL is 92.  Ask him to start taking Crestor 20 mg every single day rather than 4 times a week.  We will recheck his fasting lipid  profile later.  He also does have a problem with thyroid I will check his TSH today. 2. Essential hypertension blood pressure at home is always good slightly elevated today.  Diastolic however is within acceptable range.  I will not change any of his medication right now we will continue monitoring. 3. Left bundle branch which is chronic problem we will continue monitoring. 4. Coronary artery disease with mild to moderate disease in mid LAD.  Asymptomatic we will continue monitoring.   Medication Adjustments/Labs and Tests Ordered: Current medicines are reviewed at length with the patient today.  Concerns regarding medicines are outlined above.  Orders Placed This Encounter  Procedures  . Lipid panel  . TSH   Medication changes:  Meds ordered this encounter  Medications  . rosuvastatin (CRESTOR) 20 MG tablet    Sig: Take 1 tablet (20 mg total) by mouth daily.    Dispense:  90 tablet    Refill:  3    Signed, Park Liter, MD, [facility] 08/23/2019 4:15 PM    [facility]

## 2019-08-23 NOTE — Addendum Note (Signed)
Addended by: Gaetano Net on: 08/23/2019 04:20 PM   Modules accepted: Orders

## 2019-08-23 NOTE — Patient Instructions (Addendum)
Medication Instructions:  Your physician has recommended you make the following change in your medication:  1.  INCREASE the Crestor to 20 mg DAILY  *If you need a refill on your cardiac medications before your next appointment, please call your pharmacy*  Lab Work: TODAY:  TSH  6 WEEKS:  FASTING LIPID (nothing to eat or drink after midnight the night before)  If you have labs (blood work) drawn today and your tests are completely normal, you will receive your results only by: Marland Kitchen MyChart Message (if you have MyChart) OR . A paper copy in the mail If you have any lab test that is abnormal or we need to change your treatment, we will call you to review the results.  Testing/Procedures: None ordered  Follow-Up: At [facility], you and your health needs are our priority.  As part of our continuing mission to provide you with exceptional heart care, we have created designated Provider Care Teams.  These Care Teams include your primary Cardiologist (physician) and Advanced Practice Providers (APPs -  Physician Assistants and Nurse Practitioners) who all work together to provide you with the care you need, when you need it.  Your next appointment:    3 month(s)  The format for your next appointment:   In Person  Provider:   Jenne Campus, MD

## 2019-08-23 NOTE — Progress Notes (Signed)
ts

## 2019-08-24 LAB — TSH: TSH: 5.04 u[IU]/mL — ABNORMAL HIGH (ref 0.450–4.500)

## 2019-08-25 ENCOUNTER — Telehealth: Payer: Self-pay | Admitting: Emergency Medicine

## 2019-08-25 NOTE — Telephone Encounter (Signed)
Called patient informed him of tsh results and to follow up with pcp. Patient verbally understood no further questions.

## 2019-08-26 ENCOUNTER — Telehealth: Payer: Self-pay | Admitting: Cardiology

## 2019-08-26 NOTE — Telephone Encounter (Signed)
New Message      Pt is calling to speak with [facility] He says that his PCP says they have not received the Thyroid labs  He also says  It has not yet posted to Mychart     Please call back

## 2019-08-26 NOTE — Telephone Encounter (Signed)
Called patient. I informed him that the results were faxed yesterday with confirmation. I re faxed them just now as well. Patient also aware I am still waiting to hear back from [facility] [employer] if his results will be visible to him or not since they are going to be scanned in. He verbally understood. No further questions.

## 2019-09-01 ENCOUNTER — Telehealth: Payer: Self-pay | Admitting: *Deleted

## 2019-09-01 ENCOUNTER — Encounter: Payer: Self-pay | Admitting: *Deleted

## 2019-09-01 NOTE — Telephone Encounter (Signed)
-----   Message from Park Liter, MD sent at 08/31/2019  7:39 PM EST ----- Still have some h hypothyroidism.  His medication need to be increased.  It should be done by primary care physician.  So please send a copy to PMD

## 2019-09-01 NOTE — Telephone Encounter (Signed)
Telephone call to patient. Left message with lab results and copy sent to PCP

## 2019-09-22 ENCOUNTER — Ambulatory Visit: Payer: 59 | Admitting: Cardiology

## 2019-11-16 ENCOUNTER — Telehealth: Payer: Self-pay

## 2019-11-16 NOTE — Telephone Encounter (Signed)
Patient wants to become a new patient with Dr. Larose Kells please follow back up with the patient at [phone]

## 2019-11-16 NOTE — Telephone Encounter (Signed)
Please advise 

## 2019-11-17 NOTE — Telephone Encounter (Signed)
Called patient again and left a message to call back

## 2019-11-17 NOTE — Telephone Encounter (Signed)
Okay to schedule NP appt at his convenience.  

## 2019-11-17 NOTE — Telephone Encounter (Signed)
That is okay, schedule a OV at his convenience

## 2019-11-17 NOTE — Telephone Encounter (Signed)
Call no ans left message

## 2019-11-25 DIAGNOSIS — M720 Palmar fascial fibromatosis [Dupuytren]: Secondary | ICD-10-CM | POA: Insufficient documentation

## 2019-11-25 DIAGNOSIS — H3321 Serous retinal detachment, right eye: Secondary | ICD-10-CM | POA: Insufficient documentation

## 2019-11-25 HISTORY — DX: Palmar fascial fibromatosis (dupuytren): M72.0

## 2019-11-25 HISTORY — DX: Serous retinal detachment, right eye: H33.21

## 2019-11-26 ENCOUNTER — Encounter: Payer: Self-pay | Admitting: Family Medicine

## 2019-12-09 ENCOUNTER — Other Ambulatory Visit: Payer: Self-pay | Admitting: Family

## 2019-12-09 DIAGNOSIS — I10 Essential (primary) hypertension: Secondary | ICD-10-CM

## 2019-12-10 ENCOUNTER — Ambulatory Visit: Payer: 59 | Admitting: Internal Medicine

## 2019-12-16 ENCOUNTER — Ambulatory Visit: Payer: 59 | Admitting: Internal Medicine

## 2019-12-20 ENCOUNTER — Other Ambulatory Visit: Payer: Self-pay

## 2019-12-20 ENCOUNTER — Encounter: Payer: Self-pay | Admitting: Internal Medicine

## 2019-12-20 ENCOUNTER — Ambulatory Visit: Payer: 59 | Admitting: Internal Medicine

## 2019-12-20 VITALS — BP 137/88 | HR 83 | Temp 98.1°F | Resp 18 | Ht 74.0 in | Wt 181.1 lb

## 2019-12-20 DIAGNOSIS — I10 Essential (primary) hypertension: Secondary | ICD-10-CM

## 2019-12-20 DIAGNOSIS — E039 Hypothyroidism, unspecified: Secondary | ICD-10-CM

## 2019-12-20 DIAGNOSIS — E782 Mixed hyperlipidemia: Secondary | ICD-10-CM

## 2019-12-20 DIAGNOSIS — I2510 Atherosclerotic heart disease of native coronary artery without angina pectoris: Secondary | ICD-10-CM | POA: Diagnosis not present

## 2019-12-20 DIAGNOSIS — F5104 Psychophysiologic insomnia: Secondary | ICD-10-CM

## 2019-12-20 LAB — COMPREHENSIVE METABOLIC PANEL
ALT: 27 U/L (ref 0–53)
AST: 21 U/L (ref 0–37)
Albumin: 4.4 g/dL (ref 3.5–5.2)
Alkaline Phosphatase: 55 U/L (ref 39–117)
BUN: 15 mg/dL (ref 6–23)
CO2: 31 mEq/L (ref 19–32)
Calcium: 8.9 mg/dL (ref 8.4–10.5)
Chloride: 101 mEq/L (ref 96–112)
Creatinine, Ser: 0.99 mg/dL (ref 0.40–1.50)
GFR: 76.77 mL/min (ref >60.00)
Glucose, Bld: 94 mg/dL (ref 70–99)
Potassium: 4.5 mEq/L (ref 3.5–5.1)
Sodium: 137 mEq/L (ref 135–145)
Total Bilirubin: 0.8 mg/dL (ref 0.2–1.2)
Total Protein: 6.3 g/dL (ref 6.0–8.3)

## 2019-12-20 LAB — CBC WITH DIFFERENTIAL/PLATELET
Basophils Absolute: 0 10*3/uL (ref 0.0–0.1)
Basophils Relative: 0.3 % (ref 0.0–3.0)
Eosinophils Absolute: 0.1 10*3/uL (ref 0.0–0.7)
Eosinophils Relative: 1.4 % (ref 0.0–5.0)
HCT: 43.9 % (ref 39.0–52.0)
Hemoglobin: 15 g/dL (ref 13.0–17.0)
Lymphocytes Relative: 26 % (ref 12.0–46.0)
Lymphs Abs: 1 10*3/uL (ref 0.7–4.0)
MCHC: 34.3 g/dL (ref 30.0–36.0)
MCV: 93.5 fl (ref 78.0–100.0)
Monocytes Absolute: 0.4 10*3/uL (ref 0.1–1.0)
Monocytes Relative: 10.1 % (ref 3.0–12.0)
Neutro Abs: 2.5 10*3/uL (ref 1.4–7.7)
Neutrophils Relative %: 62.2 % (ref 43.0–77.0)
Platelets: 107 10*3/uL — ABNORMAL LOW (ref 150.0–400.0)
RBC: 4.69 Mil/uL (ref 4.22–5.81)
RDW: 13.6 % (ref 11.5–15.5)
WBC: 4 10*3/uL (ref 4.0–10.5)

## 2019-12-20 LAB — TSH: TSH: 2.62 u[IU]/mL (ref 0.35–4.50)

## 2019-12-20 MED ORDER — AZELASTINE HCL 0.1 % NA SOLN: 2.0000 | Freq: Every evening | NASAL | 12 refills | Status: DC | PRN

## 2019-12-20 MED ORDER — AZELASTINE HCL 0.1 % NA SOLN: 2.0000 | Freq: Every evening | NASAL | 12 refills | Status: AC | PRN

## 2019-12-20 NOTE — Progress Notes (Signed)
Pre visit review using our clinic review tool, if applicable. No additional management support is needed unless otherwise documented below in the visit note. 

## 2019-12-20 NOTE — Progress Notes (Signed)
Subjective:    Patient ID: Peter Spencer, male    DOB: [birth year], 59 y.o.   MRN: [id number]  DOS:  12/20/2019 Type of visit - description: New patient The patient used to have a primary doctor in [facility] but he lives in [place] and requested to transfer his primary care to this office. In general doing well. We talk about several issues including: HTN Thyroid disease CAD High cholesterol Has a rash that has flareup in the last 2 to 3 weeks. Also, has constant ear discomfort, his ears pop, worse on the left.  He thinks is due to allergies  Review of Systems Denies chest pain no difficulty breathing No nausea, vomiting, diarrhea Admits to nasal congestion, on OTC antihistaminics.  Past Medical History:  Diagnosis Date  . Anxiety   . CAD (coronary artery disease)   . GERD (gastroesophageal reflux disease)   . Hyperlipidemia   . Hypertension    no meds now  . Hypothyroidism   . Left bundle branch block   . OSA (obstructive sleep apnea)   . Retinal detachment 06/2018   Right  . Seasonal allergies   . Thyroid disease     Past Surgical History:  Procedure Laterality Date  . 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 FASCIECTOMY Left 07/11/2016   Procedure: FASCIECTOMY left index possible joint release;  Surgeon: Daryll Brod, MD;  Location: [place];  Service: Orthopedics;  Laterality: Left;  FASCIECTOMY left index possible joint release  . KNEE SURGERY    . MOHS SURGERY  2017  . NASAL FRACTURE SURGERY    . RETINAL DETACHMENT SURGERY Right 06/2018  . TOE SURGERY    . TONSILLECTOMY     Family History  Problem Relation Age of Onset  . CAD Other   . Pancreatic cancer Mother   . Atrial fibrillation Father   . Dementia Father   . Parkinsonism Father   . Stroke Neg Hx   . Diabetes Mellitus II Neg Hx   . Colon cancer Neg Hx   . Prostate cancer Neg Hx     Allergies as of 12/20/2019      Reactions   Dust Mite Extract    Mold  Extract [trichophyton]    Sulfa Antibiotics Nausea And Vomiting   Tetracyclines & Related Other (See Comments)   Caused liver enzymes to go up      Medication List       Accurate as of Dec 20, 2019  9:41 PM. If you have any questions, ask your nurse or doctor.        STOP taking these medications   losartan 25 MG tablet Commonly known as: COZAAR Stopped by: Kathlene November, MD     TAKE these medications   aspirin EC 81 MG tablet Take 81 mg by mouth daily.   azelastine 0.1 % nasal spray Commonly known as: ASTELIN Place 2 sprays into both nostrils at bedtime as needed for rhinitis or allergies. Use in each nostril as directed Started by: Kathlene November, MD   fexofenadine 180 MG tablet Commonly known as: ALLEGRA Take 180 mg by mouth at bedtime.   fluticasone 50 MCG/ACT nasal spray Commonly known as: FLONASE Place into both nostrils as needed for allergies or rhinitis.   levothyroxine 112 MCG tablet Commonly known as: SYNTHROID Take 112 mcg by mouth every morning. What changed: Another medication with the same name was removed. Continue taking this medication, and follow the directions you see here. Changed  by: Kathlene November, MD   magnesium oxide 400 MG tablet Commonly known as: MAG-OX Take 400 mg by mouth daily.   multivitamin with minerals Tabs tablet Take 1 tablet by mouth daily.   pantoprazole 20 MG tablet Commonly known as: Protonix Take 1 tablet (20 mg total) by mouth daily. What changed:   when to take this  reasons to take this   rosuvastatin 20 MG tablet Commonly known as: CRESTOR Take 1 tablet (20 mg total) by mouth daily.   Vitamin D 125 MCG (5000 UT) Caps Take 5,000 Units by mouth daily.   Zolpidem Tartrate 3.5 MG Subl Commonly known as: Intermezzo Place 1 tablet under the tongue at bedtime.          Objective:   Physical Exam BP 137/88 (BP Location: Left Arm, Patient Position: Sitting, Cuff Size: Normal)   Pulse 83   Temp 98.1 F (36.7 C)  (Temporal)   Resp 18   Ht 6\' 2"  (1.88 m)   Wt 181 lb 2 oz (82.2 kg)   SpO2 100%   BMI 23.26 kg/m  General:   Well developed, NAD, BMI noted.  HEENT:  Normocephalic . Face symmetric, atraumatic. TMs: Both are bulging, not red, no discharge.  Canals are normal. Nose is slightly congested Lungs:  CTA B Normal respiratory effort, no intercostal retractions, no accessory muscle use. Heart: RRR,  no murmur.  Abdomen:  Not distended, soft, non-tender. No rebound or rigidity.   Skin: Not pale. Not jaundice. Has a rash at the face, predominantly on the left, multiple erythematous slightly scaly areas. Lower extremities: no pretibial edema bilaterally  Neurologic:  alert & oriented X3.  Speech normal, gait appropriate for age and unassisted Psych--  Cognition and judgment appear intact.  Cooperative with normal attention span and concentration.  Behavior appropriate. No anxious or depressed appearing.     Assessment     Assessment (new patient 11/2019) HTN, history of hypertensive urgency High cholesterol Thyroid disease CAD, LBBB (+ LAD disease per coronary CT, negative a stress test 2017) OSA - borderline per pt  Dupuytren's Right retinal detachment 2019 [facility]  PLAN New patient HTN: At some point  he was taking losartan, he felt that BP was elevated due to significant stressors in his life, self stopped medication on January 2021. Reports normal ambulatory BPs at this point.  We will check a CMP CBC High cholesterol: On Crestor, last FLP excellent CAD: Last note from cardiology reviewed Hypothyroidism: Last TSH slightly elevated, reports good compliance with Synthroid daily, how to take this medication discussed. Check a TSH. Insomnia: Well-controlled with low-dose zolpidem Serous otitis: Reports ear discomfort despite taking antihistaminics, on exam TMs are bulge, recommend consistent use of Flonase and Astelin daily.  See AVS.  If not better will consider a round of  steroids. Preventive care:  Had colonoscopy in [place] twice. He will let me know the name of the physician. Had his Covid vaccinations RTC 3 months CPX  This visit occurred during the SARS-CoV-2 public health emergency.  Safety protocols were in place, including screening questions prior to the visit, additional usage of staff PPE, and extensive cleaning of exam room while observing appropriate contact time as indicated for disinfecting solutions.

## 2019-12-20 NOTE — Patient Instructions (Addendum)
For allergies: Flonase 2 sprays on each side of the nose in the morning Astelin: 2 sprays on each of the nose at night Continue with over-the-counter antihistaminic Call in 2 to 3 weeks if you are not improving    GO TO THE LAB : Get the blood work     GO TO THE FRONT DESK, [place] back for a physical in 3 months

## 2020-01-05 ENCOUNTER — Telehealth: Payer: Self-pay | Admitting: Internal Medicine

## 2020-01-05 MED ORDER — PREDNISONE 10 MG PO TABS: ORAL_TABLET | ORAL | 0 refills | Status: DC

## 2020-01-05 NOTE — Telephone Encounter (Signed)
Spoke w/ Pt- informed of recommendations. Pt verbalized understanding.  

## 2020-01-05 NOTE — Telephone Encounter (Signed)
Advise patient, I sent a round of prednisone. If he is not better after that, he needs to go for ENT referral

## 2020-01-05 NOTE — Telephone Encounter (Signed)
Please advise-Pt going out of town tomorrow morning.

## 2020-01-05 NOTE — Telephone Encounter (Signed)
Caller Peter Spencer  Call Back # [phone]  Patient called in regards to nasal spray not working well. Patient states that Dr Larose Kells, offered to try steroids as an alternative.   Please Advise

## 2020-01-21 ENCOUNTER — Other Ambulatory Visit: Payer: Self-pay | Admitting: Cardiology

## 2020-01-21 MED ORDER — ROSUVASTATIN CALCIUM 20 MG PO TABS: 20.0000 mg | ORAL_TABLET | Freq: Every day | ORAL | 0 refills | Status: DC

## 2020-01-21 NOTE — Telephone Encounter (Signed)
Rx refill sent into [facility] for Rosuvastatin 20 mg.

## 2020-01-21 NOTE — Telephone Encounter (Signed)
*  STAT* If patient is at the pharmacy, call can be transferred to refill team.   1. Which medications need to be refilled? (please list name of each medication and dose if known) rosuvastatin (CRESTOR) 20 MG tablet(Expired)  2. Which pharmacy/location (including street and city if local pharmacy) is medication to be sent to? [facility] - [place], [address]  3. Do they need a 30 day or 90 day supply? [place]

## 2020-02-28 ENCOUNTER — Encounter: Payer: 59 | Admitting: Internal Medicine

## 2020-02-28 ENCOUNTER — Encounter: Payer: Self-pay | Admitting: Internal Medicine

## 2020-03-01 ENCOUNTER — Other Ambulatory Visit: Payer: Self-pay

## 2020-03-01 ENCOUNTER — Ambulatory Visit (INDEPENDENT_AMBULATORY_CARE_PROVIDER_SITE_OTHER): Payer: 59 | Admitting: Internal Medicine

## 2020-03-01 ENCOUNTER — Encounter: Payer: Self-pay | Admitting: Internal Medicine

## 2020-03-01 VITALS — BP 153/90 | HR 73 | Temp 97.9°F | Resp 16 | Ht 74.0 in | Wt 185.0 lb

## 2020-03-01 DIAGNOSIS — L719 Rosacea, unspecified: Secondary | ICD-10-CM

## 2020-03-01 DIAGNOSIS — H6523 Chronic serous otitis media, bilateral: Secondary | ICD-10-CM

## 2020-03-01 DIAGNOSIS — E782 Mixed hyperlipidemia: Secondary | ICD-10-CM

## 2020-03-01 DIAGNOSIS — E039 Hypothyroidism, unspecified: Secondary | ICD-10-CM

## 2020-03-01 DIAGNOSIS — Z0000 Encounter for general adult medical examination without abnormal findings: Secondary | ICD-10-CM

## 2020-03-01 HISTORY — DX: Encounter for general adult medical examination without abnormal findings: Z00.00

## 2020-03-01 LAB — LIPID PANEL
Cholesterol: 140 mg/dL (ref 0–200)
HDL: 50.1 mg/dL (ref >39.00)
LDL Cholesterol: 73 mg/dL (ref 0–99)
NonHDL: 89.82
Total CHOL/HDL Ratio: 3
Triglycerides: 84 mg/dL (ref 0.0–149.0)
VLDL: 16.8 mg/dL (ref 0.0–40.0)

## 2020-03-01 LAB — AST: AST: 16 U/L (ref 0–37)

## 2020-03-01 LAB — PSA: PSA: 0.63 ng/mL (ref 0.10–4.00)

## 2020-03-01 LAB — TSH: TSH: 2.84 u[IU]/mL (ref 0.35–4.50)

## 2020-03-01 LAB — ALT: ALT: 17 U/L (ref 0–53)

## 2020-03-01 MED ORDER — ZOLPIDEM TARTRATE 3.5 MG SL SUBL: 1.0000 | SUBLINGUAL_TABLET | Freq: Every day | SUBLINGUAL | 3 refills | Status: DC

## 2020-03-01 NOTE — Progress Notes (Signed)
Subjective:    Patient ID: Peter Spencer, male    DOB: 26-Mar-1961, 59 y.o.   MRN: [id number]  DOS:  03/01/2020 Type of visit - description: CPX In addition to CPX, multiple other issues discussed.   Review of Systems Denies chest pain or difficulty breathing No LUTS. No cough or chest pain.  Other than above, a 14 point review of systems is negative      Past Medical History:  Diagnosis Date  . Anxiety   . CAD (coronary artery disease)   . GERD (gastroesophageal reflux disease)   . Hyperlipidemia   . Hypertension    no meds now  . Hypothyroidism   . Left bundle branch block   . OSA (obstructive sleep apnea)   . Retinal detachment 06/2018   Right  . Seasonal allergies   . Thyroid disease     Past Surgical History:  Procedure Laterality Date  . 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 FASCIECTOMY Left 07/11/2016   Procedure: FASCIECTOMY left index possible joint release;  Surgeon: Daryll Brod, MD;  Location: [facility];  Service: Orthopedics;  Laterality: Left;  FASCIECTOMY left index possible joint release  . KNEE SURGERY    . MOHS SURGERY  2017  . NASAL FRACTURE SURGERY    . RETINAL DETACHMENT SURGERY Right 06/2018  . TOE SURGERY    . TONSILLECTOMY      Allergies as of 03/01/2020      Reactions   Dust Mite Extract    Mold Extract [trichophyton]    Sulfa Antibiotics Nausea And Vomiting   Tetracyclines & Related Other (See Comments)   Caused liver enzymes to go up      Medication List       Accurate as of March 01, 2020 11:59 PM. If you have any questions, ask your nurse or doctor.        STOP taking these medications   doxycycline 100 MG capsule Commonly known as: MONODOX Stopped by: Kathlene November, MD   pantoprazole 20 MG tablet Commonly known as: Protonix Stopped by: Kathlene November, MD   predniSONE 10 MG tablet Commonly known as: DELTASONE Stopped by: Kathlene November, MD     TAKE these medications   aspirin EC 81 MG tablet Take 81  mg by mouth daily.   azelastine 0.1 % nasal spray Commonly known as: ASTELIN Place 2 sprays into both nostrils at bedtime as needed for rhinitis or allergies. Use in each nostril as directed   fexofenadine 180 MG tablet Commonly known as: ALLEGRA Take 180 mg by mouth at bedtime.   fluticasone 50 MCG/ACT nasal spray Commonly known as: FLONASE Place into both nostrils as needed for allergies or rhinitis.   levothyroxine 112 MCG tablet Commonly known as: SYNTHROID Take 112 mcg by mouth every morning.   magnesium oxide 400 MG tablet Commonly known as: MAG-OX Take 400 mg by mouth daily.   multivitamin with minerals Tabs tablet Take 1 tablet by mouth daily.   rosuvastatin 20 MG tablet Commonly known as: CRESTOR Take 1 tablet (20 mg total) by mouth daily.   Vitamin D 125 MCG (5000 UT) Caps Take 5,000 Units by mouth daily.   Zolpidem Tartrate 3.5 MG Subl Commonly known as: Intermezzo Place 1 tablet under the tongue at bedtime.          Objective:   Physical Exam BP (!) 153/90 (BP Location: Left Arm, Patient Position: Sitting, Cuff Size: Small)   Pulse 73   Temp 97.9  F (36.6 C) (Oral)   Resp 16   Ht 6\' 2"  (1.88 m)   Wt 185 lb (83.9 kg)   SpO2 100%   BMI 23.75 kg/m  General: Well developed, NAD, BMI noted Neck: No  thyromegaly  HEENT:  Normocephalic . Face symmetric, atraumatic TMs: Continue to be bulging but not red. Throat: Symmetric not red Lungs:  CTA B Normal respiratory effort, no intercostal retractions, no accessory muscle use. Heart: RRR,  no murmur.  Abdomen:  Not distended, soft, non-tender. No rebound or rigidity.   Lower extremities: no pretibial edema bilaterally  Skin: Exposed areas without rash. Not pale. Not jaundice DRE: Normal sphincter tone, no stools. Prostate: Not enlarged, not tender, has a 3 mm nodule versus a prostate lobule proximal on the right prostate. Neurologic:  alert & oriented X3.  Speech normal, gait appropriate for age  and unassisted Strength symmetric and appropriate for age.  Psych: Cognition and judgment appear intact.  Cooperative with normal attention span and concentration.  Behavior appropriate. No anxious or depressed appearing.     Assessment     Assessment (new patient 11/2019) HTN, history of hypertensive urgency High cholesterol Thyroid disease CAD, LBBB (+ LAD disease per coronary CT, negative a stress test 2017) OSA - borderline per pt  Dupuytren's Right retinal detachment 2019 [place] Rosacea dx ~ 2017, seen @ [place] derm  PLAN Here for CPX HTN: BP today slightly elevated at home consistently 135/80. High cholesterol: On Crestor, check FLP Hypothyroidism: On Synthroid, check a TSH CAD: Asymptomatic Serous otitis: Slightly better but continued to experience pressure at the ears despite taking Flonase, Astelin and a round of prednisone, refer to ENT. Rosacea: Diagnosed with rosacea few years ago, went to the beach lately, face rosacea flareup, had a E-visit, prescribed doxycycline 100 mg twice daily, Flagyl topically.  He is better.  Plan:  Stop doxycycline, history of increased LFTs with tetracyclines per chart review. Continue Flagyl topically, refer to Derm, Dr. Allyson Sabal, check LFTs Insomnia: Refill zolpidem RTC 6 months   I spent 20 minutes -in addition to CPX- managing his chronic medical problems new problem (rosacea)  This visit occurred during the SARS-CoV-2 public health emergency.  Safety protocols were in place, including screening questions prior to the visit, additional usage of staff PPE, and extensive cleaning of exam room while observing appropriate contact time as indicated for disinfecting solutions.

## 2020-03-01 NOTE — Progress Notes (Signed)
Pre visit review using our clinic review tool, if applicable. No additional management support is needed unless otherwise documented below in the visit note. 

## 2020-03-01 NOTE — Patient Instructions (Addendum)
Check the  blood pressure weekly BP GOAL is between 110/65 and  135/85. If it is consistently higher or lower, let me know  For rosacea: Restart Flagyl cream  Refer to Dr. Allyson Sabal, dermatology    GO TO THE LAB : Get the blood work     [place], [place] back for a checkup in 6 months

## 2020-03-02 ENCOUNTER — Encounter: Payer: Self-pay | Admitting: Internal Medicine

## 2020-03-02 DIAGNOSIS — Z09 Encounter for follow-up examination after completed treatment for conditions other than malignant neoplasm: Secondary | ICD-10-CM

## 2020-03-02 HISTORY — DX: Encounter for follow-up examination after completed treatment for conditions other than malignant neoplasm: Z09

## 2020-03-02 NOTE — Assessment & Plan Note (Signed)
Here for CPX HTN: BP today slightly elevated at home consistently 135/80. High cholesterol: On Crestor, check FLP Hypothyroidism: On Synthroid, check a TSH CAD: Asymptomatic Serous otitis: Slightly better but continued to experience pressure at the ears despite taking Flonase, Astelin and a round of prednisone, refer to ENT. Rosacea: Diagnosed with rosacea few years ago, went to the beach lately, face rosacea flareup, had a E-visit, prescribed doxycycline 100 mg twice daily, Flagyl topically.  He is better.  Plan:  Stop doxycycline, history of increased LFTs with tetracyclines per chart review. Continue Flagyl topically, refer to Derm, Dr. Allyson Sabal, check LFTs Insomnia: Refill zolpidem RTC 6 months

## 2020-03-02 NOTE — Assessment & Plan Note (Signed)
Td 2014 Had Covid vaccination S/p Shingrix CCS: Had a colonoscopy 2017, 5 years Prostate cancer screening: DRE, question of nodule versus lobule right prostate, check a PSA, low threshold for urology referral. Labs: LFTs, FLP, TSH, PSA. Diet and exercise discussed

## 2020-05-19 ENCOUNTER — Telehealth: Payer: Self-pay | Admitting: Internal Medicine

## 2020-05-19 NOTE — Telephone Encounter (Signed)
Was dispensed 30 tablets 04/23/2020 per PDMP, please call the pharmacy, okay early release

## 2020-05-19 NOTE — Telephone Encounter (Signed)
Spoke w/ Mickel Baas at [facility]- okay'd to release zolpidem early per PCP. Mickel Baas verbalized understanding and will let Pt know when ready.

## 2020-05-19 NOTE — Telephone Encounter (Signed)
Patient states he needs to be able to get his medication few days early due to a work trip. Kristopher Oppenheim told patient the doctor would have to call to authorize an early release of medication.  Zolpidem Tartrate (INTERMEZZO) 3.5 MG SUBL [[id number]]   Kristopher Oppenheim [facility] [address] - [place], [place] - [address], [place] [address]  Phone:  [phone] Fax:  [phone]

## 2020-05-19 NOTE — Telephone Encounter (Signed)
Please advise 

## 2020-06-10 ENCOUNTER — Other Ambulatory Visit: Payer: Self-pay | Admitting: Family

## 2020-06-12 ENCOUNTER — Telehealth: Payer: Self-pay | Admitting: Internal Medicine

## 2020-06-12 MED ORDER — LEVOTHYROXINE SODIUM 112 MCG PO TABS: 112.0000 ug | ORAL_TABLET | Freq: Every day | ORAL | 1 refills | Status: DC

## 2020-06-12 NOTE — Telephone Encounter (Signed)
Rx sent 

## 2020-06-12 NOTE — Telephone Encounter (Signed)
Medication: levothyroxine (SYNTHROID) 112 MCG tablet   Has the patient contacted their pharmacy? No. (If no, request that the patient contact the pharmacy for the refill.) (If yes, when and what did the pharmacy advise?)  Preferred Pharmacy (with phone number or street name): [place], [address] Phone:  [phone]  Fax:  [phone]     Agent: Please be advised that RX refills may take up to 3 business days. We ask that you follow-up with your pharmacy.

## 2020-07-29 HISTORY — PX: RETINAL DETACHMENT SURGERY: SHX105

## 2020-08-14 ENCOUNTER — Telehealth: Payer: Self-pay | Admitting: Internal Medicine

## 2020-08-15 NOTE — Telephone Encounter (Signed)
Medication:Zolpidem Tartrate (INTERMEZZO) 3.5 MG SUBL [[id number]]      Has the patient contacted their pharmacy? no (If no, request that the patient contact the pharmacy for the refill.) (If yes, when and what did the pharmacy advise?)    Preferred Pharmacy (with phone number or street name): [place], [address] Phone:  [phone]  Fax:  [phone]          Agent: Please be advised that RX refills may take up to 3 business days. We ask that you follow-up with your pharmacy.

## 2020-08-15 NOTE — Telephone Encounter (Signed)
Requesting: zolpidem 3.5mg  sublingual  Contract: None UDS: None Last Visit: 03/01/2020 Next Visit: None Last Refill: 03/01/2020 #30 and 3RF  Please Advise

## 2020-08-15 NOTE — Telephone Encounter (Signed)
PDMP okay, Rx sent 

## 2020-08-22 ENCOUNTER — Other Ambulatory Visit: Payer: Self-pay

## 2020-08-22 MED ORDER — ROSUVASTATIN CALCIUM 20 MG PO TABS: 20.0000 mg | ORAL_TABLET | Freq: Every day | ORAL | 0 refills | Status: DC

## 2020-08-22 NOTE — Telephone Encounter (Signed)
Refill of Rosuvastatin 20 mg sent to [place]

## 2020-09-20 ENCOUNTER — Telehealth: Payer: Self-pay | Admitting: Cardiology

## 2020-09-20 NOTE — Telephone Encounter (Signed)
   *  STAT* If patient is at the pharmacy, call can be transferred to refill team.   1. Which medications need to be refilled? (please list name of each medication and dose if known) rosuvastatin (CRESTOR) 20 MG tablet  2. Which pharmacy/location (including street and city if local pharmacy) is medication to be sent to? [facility] - [place], [address]  3. Do they need a 30 day or 90 day supply? 60 days   Pt made an appt with Dr. Raliegh Ip on 04/06

## 2020-09-21 MED ORDER — ROSUVASTATIN CALCIUM 20 MG PO TABS: 20.0000 mg | ORAL_TABLET | Freq: Every day | ORAL | 0 refills | Status: DC

## 2020-09-21 NOTE — Telephone Encounter (Signed)
Refill sent to pharmacy.   

## 2020-10-02 ENCOUNTER — Institutional Professional Consult (permissible substitution): Payer: 59 | Admitting: Plastic Surgery

## 2020-10-05 ENCOUNTER — Other Ambulatory Visit: Payer: Self-pay

## 2020-10-05 ENCOUNTER — Ambulatory Visit (INDEPENDENT_AMBULATORY_CARE_PROVIDER_SITE_OTHER): Payer: 59 | Admitting: Plastic Surgery

## 2020-10-05 ENCOUNTER — Encounter: Payer: Self-pay | Admitting: Plastic Surgery

## 2020-10-05 VITALS — BP 147/82 | HR 86 | Ht 75.0 in | Wt 190.0 lb

## 2020-10-05 DIAGNOSIS — D049 Carcinoma in situ of skin, unspecified: Secondary | ICD-10-CM

## 2020-10-05 NOTE — H&P (View-Only) (Signed)
Referring Provider [facility], [address] [place],  [place] [address]   CC:  Chief Complaint  Patient presents with  . Advice Only      Peter Spencer is an 60 y.o. male.  HPI: Patient presents to discuss treatment of a squamous cell carcinoma of the scalp.  He was diagnosed and biopsied by his dermatologist.  They had been planning to do Mohs surgery but the patient has had a bad experience with that in the past.  He wants to be under general anesthesia for his treatment.  He is here for me to discuss excising it and subsequent reconstruction.  He has had a squamous cell carcinoma removed and that area before slightly to the side of it.  Allergies  Allergen Reactions  . Dust Mite Extract   . Mold Extract [Trichophyton]   . Sulfa Antibiotics Nausea And Vomiting  . Tetracyclines & Related Other (See Comments)    Caused liver enzymes to go up    Outpatient Encounter Medications as of 10/05/2020  Medication Sig  . aspirin EC 81 MG tablet Take 81 mg by mouth daily.  Marland Kitchen azelastine (ASTELIN) 0.1 % nasal spray Place 2 sprays into both nostrils at bedtime as needed for rhinitis or allergies. Use in each nostril as directed  . Cholecalciferol (VITAMIN D) 125 MCG (5000 UT) CAPS Take 5,000 Units by mouth daily.  . fexofenadine (ALLEGRA) 180 MG tablet Take 180 mg by mouth at bedtime.  . fluticasone (FLONASE) 50 MCG/ACT nasal spray Place into both nostrils as needed for allergies or rhinitis.  Marland Kitchen levothyroxine (SYNTHROID) 112 MCG tablet Take 1 tablet (112 mcg total) by mouth daily before breakfast.  . magnesium oxide (MAG-OX) 400 MG tablet Take 400 mg by mouth daily.  . Multiple Vitamin (MULTIVITAMIN WITH MINERALS) TABS tablet Take 1 tablet by mouth daily.  . rosuvastatin (CRESTOR) 20 MG tablet Take 1 tablet (20 mg total) by mouth daily.  . Zolpidem Tartrate 3.5 MG SUBL PLACE ONE TABLET UNDER THE TONGUE EVERY NIGHT AT BEDTIME   No facility-administered encounter  medications on file as of 10/05/2020.     Past Medical History:  Diagnosis Date  . Anxiety   . CAD (coronary artery disease)   . GERD (gastroesophageal reflux disease)   . Hyperlipidemia   . Hypertension    no meds now  . Hypothyroidism   . Left bundle branch block   . OSA (obstructive sleep apnea)   . Retinal detachment 06/2018   Right  . Seasonal allergies   . Thyroid disease     Past Surgical History:  Procedure Laterality Date  . 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 FASCIECTOMY Left 07/11/2016   Procedure: FASCIECTOMY left index possible joint release;  Surgeon: Daryll Brod, MD;  Location: [place];  Service: Orthopedics;  Laterality: Left;  FASCIECTOMY left index possible joint release  . KNEE SURGERY    . MOHS SURGERY  2017  . NASAL FRACTURE SURGERY    . RETINAL DETACHMENT SURGERY Right 06/2018  . TOE SURGERY    . TONSILLECTOMY      Family History  Problem Relation Age of Onset  . CAD Other   . Pancreatic cancer Mother   . Atrial fibrillation Father   . Dementia Father   . Parkinsonism Father   . Stroke Neg Hx   . Diabetes Mellitus II Neg Hx   . Colon cancer Neg Hx   . Prostate cancer  Neg Hx     Social History   Social History Narrative  . Not on file     Review of Systems General: Denies fevers, chills, weight loss CV: Denies chest pain, shortness of breath, palpitations  Physical Exam Vitals with BMI 10/05/2020 03/01/2020 12/20/2019  Height 6\' 3"  6\' 2"  6\' 2"   Weight 190 lbs 185 lbs 181 lbs 2 oz  BMI 23.75 66.44 03.47  Systolic 425 956 387  Diastolic 82 90 88  Pulse 86 73 83    General:  No acute distress,  Alert and oriented, Non-Toxic, Normal speech and affect Examination shows a 2 cm area of ulceration on the vertex of his scalp.  He has surrounding thinning of his scalp and its not very mobile.  It is in an area where he is bald already.  Assessment/Plan Patient presents with squamous cell carcinoma of the scalp.  We  discussed excision with frozen section in the operating room followed by closure.  I explained if it was a superficial excision I could likely close it with a split-thickness or full-thickness skin graft.  If it did go down to the bone which is possible because the scalp is so thin I would use Integra in that case.  I explained the risks and benefits that include bleeding, infection, damage to surrounding structures need for additional procedures.  All of his questions were answered we will plan to move ahead.  Cindra Presume 10/05/2020, 4:46 PM

## 2020-10-05 NOTE — Progress Notes (Signed)
Referring Provider Colon Branch, [address] [place],  [place] [address]   CC:  Chief Complaint  Patient presents with  . Advice Only      Peter Spencer is an 60 y.o. male.  HPI: Patient presents to discuss treatment of a squamous cell carcinoma of the scalp.  He was diagnosed and biopsied by his dermatologist.  They had been planning to do Mohs surgery but the patient has had a bad experience with that in the past.  He wants to be under general anesthesia for his treatment.  He is here for me to discuss excising it and subsequent reconstruction.  He has had a squamous cell carcinoma removed and that area before slightly to the side of it.  Allergies  Allergen Reactions  . Dust Mite Extract   . Mold Extract [Trichophyton]   . Sulfa Antibiotics Nausea And Vomiting  . Tetracyclines & Related Other (See Comments)    Caused liver enzymes to go up    Outpatient Encounter Medications as of 10/05/2020  Medication Sig  . aspirin EC 81 MG tablet Take 81 mg by mouth daily.  Marland Kitchen azelastine (ASTELIN) 0.1 % nasal spray Place 2 sprays into both nostrils at bedtime as needed for rhinitis or allergies. Use in each nostril as directed  . Cholecalciferol (VITAMIN D) 125 MCG (5000 UT) CAPS Take 5,000 Units by mouth daily.  . fexofenadine (ALLEGRA) 180 MG tablet Take 180 mg by mouth at bedtime.  . fluticasone (FLONASE) 50 MCG/ACT nasal spray Place into both nostrils as needed for allergies or rhinitis.  Marland Kitchen levothyroxine (SYNTHROID) 112 MCG tablet Take 1 tablet (112 mcg total) by mouth daily before breakfast.  . magnesium oxide (MAG-OX) 400 MG tablet Take 400 mg by mouth daily.  . Multiple Vitamin (MULTIVITAMIN WITH MINERALS) TABS tablet Take 1 tablet by mouth daily.  . rosuvastatin (CRESTOR) 20 MG tablet Take 1 tablet (20 mg total) by mouth daily.  . Zolpidem Tartrate 3.5 MG SUBL PLACE ONE TABLET UNDER THE TONGUE EVERY NIGHT AT BEDTIME   No facility-administered encounter  medications on file as of 10/05/2020.     Past Medical History:  Diagnosis Date  . Anxiety   . CAD (coronary artery disease)   . GERD (gastroesophageal reflux disease)   . Hyperlipidemia   . Hypertension    no meds now  . Hypothyroidism   . Left bundle branch block   . OSA (obstructive sleep apnea)   . Retinal detachment 06/2018   Right  . Seasonal allergies   . Thyroid disease     Past Surgical History:  Procedure Laterality Date  . 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 FASCIECTOMY Left 07/11/2016   Procedure: FASCIECTOMY left index possible joint release;  Surgeon: Daryll Brod, MD;  Location: [facility];  Service: Orthopedics;  Laterality: Left;  FASCIECTOMY left index possible joint release  . KNEE SURGERY    . MOHS SURGERY  2017  . NASAL FRACTURE SURGERY    . RETINAL DETACHMENT SURGERY Right 06/2018  . TOE SURGERY    . TONSILLECTOMY      Family History  Problem Relation Age of Onset  . CAD Other   . Pancreatic cancer Mother   . Atrial fibrillation Father   . Dementia Father   . Parkinsonism Father   . Stroke Neg Hx   . Diabetes Mellitus II Neg Hx   . Colon cancer Neg Hx   . Prostate cancer  Neg Hx     Social History   Social History Narrative  . Not on file     Review of Systems General: Denies fevers, chills, weight loss CV: Denies chest pain, shortness of breath, palpitations  Physical Exam Vitals with BMI 10/05/2020 03/01/2020 12/20/2019  Height 6\' 3"  6\' 2"  6\' 2"   Weight 190 lbs 185 lbs 181 lbs 2 oz  BMI 23.75 97.28 20.60  Systolic 156 153 794  Diastolic 82 90 88  Pulse 86 73 83    General:  No acute distress,  Alert and oriented, Non-Toxic, Normal speech and affect Examination shows a 2 cm area of ulceration on the vertex of his scalp.  He has surrounding thinning of his scalp and its not very mobile.  It is in an area where he is bald already.  Assessment/Plan Patient presents with squamous cell carcinoma of the scalp.  We  discussed excision with frozen section in the operating room followed by closure.  I explained if it was a superficial excision I could likely close it with a split-thickness or full-thickness skin graft.  If it did go down to the bone which is possible because the scalp is so thin I would use Integra in that case.  I explained the risks and benefits that include bleeding, infection, damage to surrounding structures need for additional procedures.  All of his questions were answered we will plan to move ahead.  Cindra Presume 10/05/2020, 4:46 PM

## 2020-10-06 ENCOUNTER — Encounter: Payer: Self-pay | Admitting: Internal Medicine

## 2020-10-23 ENCOUNTER — Other Ambulatory Visit: Payer: Self-pay

## 2020-10-23 ENCOUNTER — Encounter (HOSPITAL_BASED_OUTPATIENT_CLINIC_OR_DEPARTMENT_OTHER): Payer: Self-pay | Admitting: Plastic Surgery

## 2020-10-26 ENCOUNTER — Other Ambulatory Visit (HOSPITAL_COMMUNITY)
Admission: RE | Admit: 2020-10-26 | Discharge: 2020-10-26 | Disposition: A | Discharge status: Home / Self Care | Payer: 59 | Source: Ambulatory Visit | Attending: Plastic Surgery | Admitting: Plastic Surgery

## 2020-10-26 DIAGNOSIS — Z20822 Contact with and (suspected) exposure to covid-19: Secondary | ICD-10-CM | POA: Diagnosis not present

## 2020-10-26 DIAGNOSIS — Z01812 Encounter for preprocedural laboratory examination: Secondary | ICD-10-CM | POA: Insufficient documentation

## 2020-10-26 LAB — SARS CORONAVIRUS 2 (TAT 6-24 HRS): SARS Coronavirus 2: NEGATIVE

## 2020-10-27 ENCOUNTER — Other Ambulatory Visit: Payer: Self-pay

## 2020-10-27 DIAGNOSIS — E039 Hypothyroidism, unspecified: Secondary | ICD-10-CM | POA: Insufficient documentation

## 2020-10-27 DIAGNOSIS — I10 Essential (primary) hypertension: Secondary | ICD-10-CM | POA: Insufficient documentation

## 2020-10-27 DIAGNOSIS — K219 Gastro-esophageal reflux disease without esophagitis: Secondary | ICD-10-CM | POA: Insufficient documentation

## 2020-10-27 DIAGNOSIS — F419 Anxiety disorder, unspecified: Secondary | ICD-10-CM | POA: Insufficient documentation

## 2020-10-27 DIAGNOSIS — J302 Other seasonal allergic rhinitis: Secondary | ICD-10-CM | POA: Insufficient documentation

## 2020-10-27 DIAGNOSIS — I447 Left bundle-branch block, unspecified: Secondary | ICD-10-CM | POA: Insufficient documentation

## 2020-10-27 DIAGNOSIS — E079 Disorder of thyroid, unspecified: Secondary | ICD-10-CM | POA: Insufficient documentation

## 2020-10-27 DIAGNOSIS — I2510 Atherosclerotic heart disease of native coronary artery without angina pectoris: Secondary | ICD-10-CM | POA: Insufficient documentation

## 2020-10-30 ENCOUNTER — Encounter (HOSPITAL_BASED_OUTPATIENT_CLINIC_OR_DEPARTMENT_OTHER)
Admission: RE | Disposition: A | Discharge status: Home / Self Care | Payer: Self-pay | Source: Home / Self Care | Attending: Plastic Surgery

## 2020-10-30 ENCOUNTER — Ambulatory Visit (HOSPITAL_BASED_OUTPATIENT_CLINIC_OR_DEPARTMENT_OTHER): Payer: 59 | Admitting: Certified Registered"

## 2020-10-30 ENCOUNTER — Ambulatory Visit (HOSPITAL_BASED_OUTPATIENT_CLINIC_OR_DEPARTMENT_OTHER)
Admission: RE | Admit: 2020-10-30 | Discharge: 2020-10-30 | Disposition: A | Discharge status: Home / Self Care | Payer: 59 | Attending: Plastic Surgery | Admitting: Plastic Surgery

## 2020-10-30 ENCOUNTER — Encounter (HOSPITAL_BASED_OUTPATIENT_CLINIC_OR_DEPARTMENT_OTHER): Payer: Self-pay | Admitting: Plastic Surgery

## 2020-10-30 DIAGNOSIS — Z7982 Long term (current) use of aspirin: Secondary | ICD-10-CM | POA: Insufficient documentation

## 2020-10-30 DIAGNOSIS — Z881 Allergy status to other antibiotic agents status: Secondary | ICD-10-CM | POA: Insufficient documentation

## 2020-10-30 DIAGNOSIS — Z820 Family history of epilepsy and other diseases of the nervous system: Secondary | ICD-10-CM | POA: Diagnosis not present

## 2020-10-30 DIAGNOSIS — C4442 Squamous cell carcinoma of skin of scalp and neck: Secondary | ICD-10-CM

## 2020-10-30 DIAGNOSIS — Z9109 Other allergy status, other than to drugs and biological substances: Secondary | ICD-10-CM | POA: Diagnosis not present

## 2020-10-30 DIAGNOSIS — Z79899 Other long term (current) drug therapy: Secondary | ICD-10-CM | POA: Diagnosis not present

## 2020-10-30 DIAGNOSIS — Z800 Family history of malignant neoplasm of digestive organs: Secondary | ICD-10-CM | POA: Insufficient documentation

## 2020-10-30 DIAGNOSIS — Z8249 Family history of ischemic heart disease and other diseases of the circulatory system: Secondary | ICD-10-CM | POA: Diagnosis not present

## 2020-10-30 DIAGNOSIS — Z882 Allergy status to sulfonamides status: Secondary | ICD-10-CM | POA: Diagnosis not present

## 2020-10-30 HISTORY — PX: BASAL CELL CARCINOMA EXCISION: SHX1214

## 2020-10-30 SURGERY — EXCISION, CARCINOMA, BASAL CELL, WITH FROZEN SECTION EXAMINATION
Anesthesia: General | Site: Scalp

## 2020-10-30 MED ORDER — LIDOCAINE-EPINEPHRINE 1 %-1:100000 IJ SOLN
INTRAMUSCULAR | Status: AC
  Filled 2020-10-30: qty 1

## 2020-10-30 MED ORDER — BUPIVACAINE-EPINEPHRINE 0.25% -1:200000 IJ SOLN
INTRAMUSCULAR | Status: DC | PRN
  Administered 2020-10-30: 5 mL

## 2020-10-30 MED ORDER — PHENYLEPHRINE 40 MCG/ML (10ML) SYRINGE FOR IV PUSH (FOR BLOOD PRESSURE SUPPORT)
PREFILLED_SYRINGE | INTRAVENOUS | Status: AC
  Filled 2020-10-30: qty 10

## 2020-10-30 MED ORDER — EPINEPHRINE PF 1 MG/ML IJ SOLN
INTRAMUSCULAR | Status: AC
  Filled 2020-10-30: qty 1

## 2020-10-30 MED ORDER — PHENYLEPHRINE HCL (PRESSORS) 10 MG/ML IV SOLN
INTRAVENOUS | Status: DC | PRN
  Administered 2020-10-30: 80 ug via INTRAVENOUS

## 2020-10-30 MED ORDER — PROMETHAZINE HCL 25 MG/ML IJ SOLN: 6.2500 mg | INTRAMUSCULAR | Status: DC | PRN

## 2020-10-30 MED ORDER — KETOROLAC TROMETHAMINE 30 MG/ML IJ SOLN: 30.0000 mg | Freq: Once | INTRAMUSCULAR | Status: DC | PRN

## 2020-10-30 MED ORDER — BACITRACIN ZINC 500 UNIT/GM EX OINT
TOPICAL_OINTMENT | CUTANEOUS | Status: AC
  Filled 2020-10-30: qty 28.35

## 2020-10-30 MED ORDER — MIDAZOLAM HCL 2 MG/2ML IJ SOLN
INTRAMUSCULAR | Status: AC
  Filled 2020-10-30: qty 2

## 2020-10-30 MED ORDER — AMISULPRIDE (ANTIEMETIC) 5 MG/2ML IV SOLN: 10.0000 mg | Freq: Once | INTRAVENOUS | Status: DC | PRN

## 2020-10-30 MED ORDER — PROPOFOL 10 MG/ML IV BOLUS
INTRAVENOUS | Status: DC | PRN
  Administered 2020-10-30: 200 mg via INTRAVENOUS

## 2020-10-30 MED ORDER — BACITRACIN ZINC 500 UNIT/GM EX OINT
TOPICAL_OINTMENT | CUTANEOUS | Status: AC
  Filled 2020-10-30: qty 1.8

## 2020-10-30 MED ORDER — LIDOCAINE 2% (20 MG/ML) 5 ML SYRINGE
INTRAMUSCULAR | Status: DC | PRN
  Administered 2020-10-30: 60 mg via INTRAVENOUS

## 2020-10-30 MED ORDER — MIDAZOLAM HCL 5 MG/5ML IJ SOLN
INTRAMUSCULAR | Status: DC | PRN
  Administered 2020-10-30: 2 mg via INTRAVENOUS

## 2020-10-30 MED ORDER — CEFAZOLIN SODIUM-DEXTROSE 2-4 GM/100ML-% IV SOLN
2.0000 g | INTRAVENOUS | Status: AC
  Administered 2020-10-30: 2 g via INTRAVENOUS

## 2020-10-30 MED ORDER — OXYCODONE HCL 5 MG/5ML PO SOLN: 5.0000 mg | Freq: Once | ORAL | Status: DC | PRN

## 2020-10-30 MED ORDER — DEXAMETHASONE SODIUM PHOSPHATE 4 MG/ML IJ SOLN
INTRAMUSCULAR | Status: DC | PRN
  Administered 2020-10-30: 5 mg via INTRAVENOUS

## 2020-10-30 MED ORDER — LACTATED RINGERS IV SOLN: INTRAVENOUS | Status: DC

## 2020-10-30 MED ORDER — EPHEDRINE 5 MG/ML INJ
INTRAVENOUS | Status: AC
  Filled 2020-10-30: qty 10

## 2020-10-30 MED ORDER — FENTANYL CITRATE (PF) 100 MCG/2ML IJ SOLN: 25.0000 ug | INTRAMUSCULAR | Status: DC | PRN

## 2020-10-30 MED ORDER — OXYCODONE HCL 5 MG PO TABS: 5.0000 mg | ORAL_TABLET | Freq: Once | ORAL | Status: DC | PRN

## 2020-10-30 MED ORDER — HYDROCODONE-ACETAMINOPHEN 7.5-325 MG PO TABS: 1.0000 | ORAL_TABLET | Freq: Four times a day (QID) | ORAL | 0 refills | Status: AC | PRN

## 2020-10-30 MED ORDER — FENTANYL CITRATE (PF) 100 MCG/2ML IJ SOLN
INTRAMUSCULAR | Status: DC | PRN
  Administered 2020-10-30: 50 ug via INTRAVENOUS

## 2020-10-30 MED ORDER — ONDANSETRON HCL 4 MG/2ML IJ SOLN
INTRAMUSCULAR | Status: DC | PRN
  Administered 2020-10-30: 4 mg via INTRAVENOUS

## 2020-10-30 MED ORDER — FENTANYL CITRATE (PF) 100 MCG/2ML IJ SOLN
INTRAMUSCULAR | Status: AC
  Filled 2020-10-30: qty 2

## 2020-10-30 MED ORDER — CEFAZOLIN SODIUM-DEXTROSE 2-4 GM/100ML-% IV SOLN
INTRAVENOUS | Status: AC
  Filled 2020-10-30: qty 100

## 2020-10-30 MED ORDER — LIDOCAINE-EPINEPHRINE 1 %-1:100000 IJ SOLN
INTRAMUSCULAR | Status: DC | PRN
  Administered 2020-10-30: 5 mL

## 2020-10-30 MED ORDER — PROPOFOL 500 MG/50ML IV EMUL
INTRAVENOUS | Status: DC | PRN
  Administered 2020-10-30: 25 ug/kg/min via INTRAVENOUS

## 2020-10-30 SURGICAL SUPPLY — 113 items
ADH SKN CLS APL DERMABOND .7 (GAUZE/BANDAGES/DRESSINGS)
APL SKNCLS STERI-STRIP NONHPOA (GAUZE/BANDAGES/DRESSINGS)
BALL CTTN LRG ABS STRL LF (GAUZE/BANDAGES/DRESSINGS)
BENZOIN TINCTURE PRP APPL 2/3 (GAUZE/BANDAGES/DRESSINGS) IMPLANT
BLADE CLIPPER SURG (BLADE) IMPLANT
BLADE DERMATOME SS (BLADE) IMPLANT
BLADE SURG 10 STRL SS (BLADE) IMPLANT
BLADE SURG 15 STRL LF DISP TIS (BLADE) ×2 IMPLANT
BLADE SURG 15 STRL SS (BLADE) ×4
BNDG COHESIVE 4X5 TAN STRL (GAUZE/BANDAGES/DRESSINGS) IMPLANT
BNDG CONFORM 2 STRL LF (GAUZE/BANDAGES/DRESSINGS) IMPLANT
BNDG ELASTIC 2X5.8 VLCR STR LF (GAUZE/BANDAGES/DRESSINGS) IMPLANT
BNDG ELASTIC 3X5.8 VLCR STR LF (GAUZE/BANDAGES/DRESSINGS) IMPLANT
BNDG ELASTIC 4X5.8 VLCR STR LF (GAUZE/BANDAGES/DRESSINGS) IMPLANT
BNDG ELASTIC 6X5.8 VLCR STR LF (GAUZE/BANDAGES/DRESSINGS) IMPLANT
BNDG GAUZE ELAST 4 BULKY (GAUZE/BANDAGES/DRESSINGS) IMPLANT
CANISTER SUCT 1200ML W/VALVE (MISCELLANEOUS) ×2 IMPLANT
CLSR STERI-STRIP ANTIMIC 1/2X4 (GAUZE/BANDAGES/DRESSINGS) IMPLANT
CORD BIPOLAR FORCEPS 12FT (ELECTRODE) IMPLANT
COTTONBALL LRG STERILE PKG (GAUZE/BANDAGES/DRESSINGS) IMPLANT
COVER BACK TABLE 60X90IN (DRAPES) ×2 IMPLANT
COVER MAYO STAND STRL (DRAPES) ×2 IMPLANT
COVER WAND RF STERILE (DRAPES) IMPLANT
DECANTER SPIKE VIAL GLASS SM (MISCELLANEOUS) ×2 IMPLANT
DERMABOND ADVANCED (GAUZE/BANDAGES/DRESSINGS)
DERMABOND ADVANCED .7 DNX12 (GAUZE/BANDAGES/DRESSINGS) IMPLANT
DERMACARRIERS GRAFT 1 TO 1.5 (DISPOSABLE)
DRAPE LAPAROTOMY 100X72 PEDS (DRAPES) IMPLANT
DRAPE SURG 17X23 STRL (DRAPES) IMPLANT
DRAPE U-SHAPE 76X120 STRL (DRAPES) ×2 IMPLANT
DRAPE UTILITY XL STRL (DRAPES) IMPLANT
DRESSING MEPILEX FLEX 4X4 (GAUZE/BANDAGES/DRESSINGS) ×1 IMPLANT
DRSG ADAPTIC 3X8 NADH LF (GAUZE/BANDAGES/DRESSINGS) IMPLANT
DRSG EMULSION OIL 3X3 NADH (GAUZE/BANDAGES/DRESSINGS) IMPLANT
DRSG MEPILEX FLEX 4X4 (GAUZE/BANDAGES/DRESSINGS) ×2
DRSG PAD ABDOMINAL 8X10 ST (GAUZE/BANDAGES/DRESSINGS) IMPLANT
ELECT COATED BLADE 2.86 ST (ELECTRODE) ×2 IMPLANT
ELECT NEEDLE BLADE 2-5/6 (NEEDLE) IMPLANT
ELECT REM PT RETURN 9FT ADLT (ELECTROSURGICAL)
ELECT REM PT RETURN 9FT PED (ELECTROSURGICAL)
ELECTRODE REM PT RETRN 9FT PED (ELECTROSURGICAL) IMPLANT
ELECTRODE REM PT RTRN 9FT ADLT (ELECTROSURGICAL) IMPLANT
GAUZE SPONGE 4X4 12PLY STRL (GAUZE/BANDAGES/DRESSINGS) ×2 IMPLANT
GAUZE SPONGE 4X4 12PLY STRL LF (GAUZE/BANDAGES/DRESSINGS) IMPLANT
GAUZE XEROFORM 1X8 LF (GAUZE/BANDAGES/DRESSINGS) IMPLANT
GAUZE XEROFORM 5X9 LF (GAUZE/BANDAGES/DRESSINGS) ×2 IMPLANT
GLOVE SRG 8 PF TXTR STRL LF DI (GLOVE) IMPLANT
GLOVE SURG ENC MOIS LTX SZ6.5 (GLOVE) ×2 IMPLANT
GLOVE SURG ENC MOIS LTX SZ7.5 (GLOVE) ×2 IMPLANT
GLOVE SURG ENC TEXT LTX SZ7.5 (GLOVE) ×2 IMPLANT
GLOVE SURG UNDER POLY LF SZ7 (GLOVE) ×4 IMPLANT
GLOVE SURG UNDER POLY LF SZ8 (GLOVE)
GOWN STRL REUS W/ TWL LRG LVL3 (GOWN DISPOSABLE) ×3 IMPLANT
GOWN STRL REUS W/TWL LRG LVL3 (GOWN DISPOSABLE) ×6
GRAFT DERMACARRIERS 1 TO 1.5 (DISPOSABLE) IMPLANT
HYDROGEN PEROXIDE 16OZ (MISCELLANEOUS) IMPLANT
MATRIX WOUND MESHED 2X2 (Tissue) ×1 IMPLANT
NEEDLE HYPO 25X1 1.5 SAFETY (NEEDLE) IMPLANT
NEEDLE HYPO 30GX1 BEV (NEEDLE) IMPLANT
NEEDLE PRECISIONGLIDE 27X1.5 (NEEDLE) IMPLANT
NEEDLE SPNL 18GX3.5 QUINCKE PK (NEEDLE) IMPLANT
NS IRRIG 1000ML POUR BTL (IV SOLUTION) ×2 IMPLANT
PACK BASIN DAY SURGERY FS (CUSTOM PROCEDURE TRAY) ×2 IMPLANT
PAD CAST 3X4 CTTN HI CHSV (CAST SUPPLIES) IMPLANT
PAD CAST 4YDX4 CTTN HI CHSV (CAST SUPPLIES) IMPLANT
PADDING CAST COTTON 3X4 STRL (CAST SUPPLIES)
PADDING CAST COTTON 4X4 STRL (CAST SUPPLIES)
PENCIL SMOKE EVACUATOR (MISCELLANEOUS) ×2 IMPLANT
SHEET MEDIUM DRAPE 40X70 STRL (DRAPES) IMPLANT
SLEEVE SCD COMPRESS KNEE MED (STOCKING) ×2 IMPLANT
SPONGE GAUZE 2X2 8PLY STRL LF (GAUZE/BANDAGES/DRESSINGS) IMPLANT
SPONGE LAP 18X18 RF (DISPOSABLE) ×2 IMPLANT
STAPLER VISISTAT 35W (STAPLE) IMPLANT
STOCKINETTE 4X48 STRL (DRAPES) IMPLANT
STOCKINETTE 6  STRL (DRAPES)
STOCKINETTE 6 STRL (DRAPES) IMPLANT
STOCKINETTE IMPERVIOUS LG (DRAPES) IMPLANT
STRIP CLOSURE SKIN 1/2X4 (GAUZE/BANDAGES/DRESSINGS) IMPLANT
STRIP SUTURE WOUND CLOSURE 1/2 (MISCELLANEOUS) IMPLANT
SUCTION FRAZIER HANDLE 10FR (MISCELLANEOUS) ×2
SUCTION TUBE FRAZIER 10FR DISP (MISCELLANEOUS) ×1 IMPLANT
SURGILUBE 2OZ TUBE FLIPTOP (MISCELLANEOUS) IMPLANT
SUT CHROMIC 4 0 P 3 18 (SUTURE) IMPLANT
SUT CHROMIC 4 0 PS 2 18 (SUTURE) IMPLANT
SUT CHROMIC 5 0 P 3 (SUTURE) IMPLANT
SUT ETHILON 4 0 P 3 18 (SUTURE) IMPLANT
SUT ETHILON 4 0 PS 2 18 (SUTURE) ×2 IMPLANT
SUT ETHILON 5 0 P 3 18 (SUTURE)
SUT MNCRL 6-0 UNDY P1 1X18 (SUTURE) IMPLANT
SUT MNCRL AB 3-0 PS2 18 (SUTURE) IMPLANT
SUT MNCRL AB 4-0 PS2 18 (SUTURE) IMPLANT
SUT MON AB 5-0 P3 18 (SUTURE) IMPLANT
SUT MONOCRYL 6-0 P1 1X18 (SUTURE)
SUT NYLON ETHILON 5-0 P-3 1X18 (SUTURE) IMPLANT
SUT PDS 3-0 CT2 (SUTURE)
SUT PDS II 3-0 CT2 27 ABS (SUTURE) IMPLANT
SUT PLAIN 5 0 P 3 18 (SUTURE) IMPLANT
SUT PROLENE 4 0 PS 2 18 (SUTURE) IMPLANT
SUT SILK 3 0 SH CR/8 (SUTURE) IMPLANT
SUT SILK 4 0 PS 2 (SUTURE) IMPLANT
SUT VIC AB 5-0 P-3 18X BRD (SUTURE) IMPLANT
SUT VIC AB 5-0 P3 18 (SUTURE)
SUT VICRYL 4-0 PS2 18IN ABS (SUTURE) ×2 IMPLANT
SUT VICRYL 6 0 P 1 18 (SUTURE) IMPLANT
SYR BULB EAR ULCER 3OZ GRN STR (SYRINGE) ×2 IMPLANT
SYR CONTROL 10ML LL (SYRINGE) ×2 IMPLANT
TOWEL GREEN STERILE FF (TOWEL DISPOSABLE) ×2 IMPLANT
TRAY DSU PREP LF (CUSTOM PROCEDURE TRAY) ×2 IMPLANT
TUBE CONNECTING 20X1/4 (TUBING) ×2 IMPLANT
TUBING INFILTRATION IT-10001 (TUBING) IMPLANT
UNDERPAD 30X36 HEAVY ABSORB (UNDERPADS AND DIAPERS) ×2 IMPLANT
WOUND MATRIX MESHED 2X2 (Tissue) ×2 IMPLANT
YANKAUER SUCT BULB TIP NO VENT (SUCTIONS) IMPLANT

## 2020-10-30 NOTE — Transfer of Care (Signed)
Immediate Anesthesia Transfer of Care Note  Patient: Peter Spencer  Procedure(s) Performed: Excision of scalp squamous cell carcinoma with frozen section, application of integra (N/A Scalp)  Patient Location: PACU  Anesthesia Type:General  Level of Consciousness: drowsy and patient cooperative  Airway & Oxygen Therapy: Patient Spontanous Breathing and Patient connected to face mask oxygen  Post-op Assessment: Report given to RN and Post -op Vital signs reviewed and stable  Post vital signs: Reviewed and stable  Last Vitals:  Vitals Value Taken Time  BP    Temp    Pulse 90 10/30/20 1146  Resp 10 10/30/20 1146  SpO2 100 % 10/30/20 1146  Vitals shown include unvalidated device data.  Last Pain:  Vitals:   10/30/20 1006  TempSrc: Oral  PainSc: 0-No pain         Complications: No complications documented.

## 2020-10-30 NOTE — Interval H&P Note (Signed)
History and Physical Interval Note:  10/30/2020 10:14 AM  Peter Spencer  has presented today for surgery, with the diagnosis of Squamous Cell Carcinoma In Situ Of Skin.  The various methods of treatment have been discussed with the patient and family. After consideration of risks, benefits and other options for treatment, the patient has consented to  Procedure(s) with comments: Excision of scalp squamous cell carcinoma with frozen sections (N/A) - 1 hour, please Closure with full-thickness skin graft versus Integra (N/A) as a surgical intervention.  The patient's history has been reviewed, patient examined, no change in status, stable for surgery.  I have reviewed the patient's chart and labs.  Questions were answered to the patient's satisfaction.     Cindra Presume

## 2020-10-30 NOTE — Anesthesia Preprocedure Evaluation (Addendum)
Anesthesia Evaluation  Patient identified by MRN, date of birth, ID band Patient awake    Reviewed: Allergy & Precautions, NPO status , Patient's Chart, lab work & pertinent test results  Airway Mallampati: III  TM Distance: >3 FB Neck ROM: Full    Dental no notable dental hx.    Pulmonary    Pulmonary exam normal breath sounds clear to auscultation       Cardiovascular hypertension, + CAD  Normal cardiovascular exam Rhythm:Regular Rate:Normal     Neuro/Psych Anxiety negative neurological ROS     GI/Hepatic negative GI ROS, Neg liver ROS,   Endo/Other  Hypothyroidism   Renal/GU negative Renal ROS     Musculoskeletal negative musculoskeletal ROS (+)   Abdominal   Peds  Hematology HLD   Anesthesia Other Findings Squamous Cell Carcinoma In Situ Of Skin  Reproductive/Obstetrics                            Anesthesia Physical Anesthesia Plan  ASA: II  Anesthesia Plan: General   Post-op Pain Management:    Induction: Intravenous  PONV Risk Score and Plan: 2 and Ondansetron, Dexamethasone, Midazolam and Treatment may vary due to age or medical condition  Airway Management Planned: LMA  Additional Equipment:   Intra-op Plan:   Post-operative Plan: Extubation in OR  Informed Consent: I have reviewed the patients History and Physical, chart, labs and discussed the procedure including the risks, benefits and alternatives for the proposed anesthesia with the patient or authorized representative who has indicated his/her understanding and acceptance.     Dental advisory given  Plan Discussed with: CRNA  Anesthesia Plan Comments:         Anesthesia Quick Evaluation

## 2020-10-30 NOTE — Anesthesia Postprocedure Evaluation (Signed)
Anesthesia Post Note  Patient: Bernardo Brayman Durand  Procedure(s) Performed: Excision of scalp squamous cell carcinoma with frozen section, application of integra (N/A Scalp)     Patient location during evaluation: PACU Anesthesia Type: General Level of consciousness: awake and alert Pain management: pain level controlled Vital Signs Assessment: post-procedure vital signs reviewed and stable Respiratory status: spontaneous breathing, nonlabored ventilation and respiratory function stable Cardiovascular status: blood pressure returned to baseline and stable Postop Assessment: no apparent nausea or vomiting Anesthetic complications: no   No complications documented.  Last Vitals:  Vitals:   10/30/20 1200 10/30/20 1215  BP: (!) 145/93 (!) 152/90  Pulse: 95 89  Resp: (!) 8 20  Temp:  36.5 C  SpO2: 100% 99%    Last Pain:  Vitals:   10/30/20 1215  TempSrc:   PainSc: 2                  Lynda Rainwater

## 2020-10-30 NOTE — Brief Op Note (Signed)
10/30/2020  11:38 AM  PATIENT:  Peter Spencer  60 y.o. male  PRE-OPERATIVE DIAGNOSIS:  Squamous Cell Carcinoma In Situ Of Skin  POST-OPERATIVE DIAGNOSIS:  Squamous Cell Carcinoma In Situ Of Skin  PROCEDURE:  Procedure(s) with comments: Excision of scalp squamous cell carcinoma with frozen section, application of integra (N/A) - 1 hour, please  SURGEON:  Surgeon(s) and Role:    * Tonyia Marschall, Steffanie Dunn, MD - Primary  PHYSICIAN ASSISTANT: Software engineer, PA  ASSISTANTS: none   ANESTHESIA:   general  EBL:  10   BLOOD ADMINISTERED:none  DRAINS: none   LOCAL MEDICATIONS USED:  MARCAINE     SPECIMEN:  Source of Specimen:  scalp squamous cell carcinoma  DISPOSITION OF SPECIMEN:  PATHOLOGY  COUNTS:  YES  TOURNIQUET:  * No tourniquets in log *  DICTATION: .Dragon Dictation  PLAN OF CARE: Discharge to home after PACU  PATIENT DISPOSITION:  PACU - hemodynamically stable.   Delay start of Pharmacological VTE agent (>24hrs) due to surgical blood loss or risk of bleeding: not applicable

## 2020-10-30 NOTE — Op Note (Signed)
Operative Note   DATE OF OPERATION: 10/30/2020  SURGICAL DEPARTMENT: Plastic Surgery  PREOPERATIVE DIAGNOSES: Scalp squamous cell carcinoma  POSTOPERATIVE DIAGNOSES:  same  PROCEDURE: 1.  Excision of scalp squamous cell carcinoma totaling 2.5 x 2.5 centimeters 2.  Closure of scalp defect with Integra bilayer totaling 2.5 x 2.5 cm  SURGEON: Talmadge Coventry, MD  ASSISTANT: Verdie Shire, PA The advanced practice practitioner (APP) assisted throughout the case.  The APP was essential in retraction and counter traction when needed to make the case progress smoothly.  This retraction and assistance made it possible to see the tissue plans for the procedure.  The assistance was needed for blood control, tissue re-approximation and assisted with closure of the incision site.  ANESTHESIA:  General.   COMPLICATIONS: None.   INDICATIONS FOR PROCEDURE:  The patient, Peter Spencer is a 60 y.o. male born on 1961-01-07, is here for treatment of scalp squamous cell carcinoma MRN: [id number]  CONSENT:  Informed consent was obtained directly from the patient. Risks, benefits and alternatives were fully discussed. Specific risks including but not limited to bleeding, infection, hematoma, seroma, scarring, pain, contracture, asymmetry, wound healing problems, and need for further surgery were all discussed. The patient did have an ample opportunity to have questions answered to satisfaction.   DESCRIPTION OF PROCEDURE:  The patient was taken to the operating room. SCDs were placed and antibiotics were given.  General anesthesia was administered.  The patient's operative site was prepped and draped in a sterile fashion. A time out was performed and all information was confirmed to be correct.  I started by marking out 6 mm margins.  I then infiltrated lidocaine combined with Marcaine for local anesthetic.  This was given time to work.  I then excised the lesion with a 15 blade down to the pericranial  layer.  It was marked with a suture at 12:00 and sent to pathology for frozen section.  Hemostasis was then obtained.  Frozen section analysis showed negative peripheral and deep margins for the tumor.  Given the tenuous and thin appearance of the pericranium and the patient's desire for no other operative sites I brought a piece of Integra onto the field.  It was soaked in saline for 2 minutes and then cut to size and set with 4-0 Vicryl sutures.  Xeroform, scrub brush sponge and 3-0 nylon sutures were then used to form a bolster.  Soft dressing was then applied.  The patient tolerated the procedure well.  There were no complications. The patient was allowed to wake from anesthesia, extubated and taken to the recovery room in satisfactory condition.

## 2020-10-30 NOTE — Discharge Instructions (Addendum)
Activity As tolerated: Do not get the area on your head wet. You can shower, but avoid water contact with head/surgical site. NO driving while in pain or taking pain medications or if you feel unable to safely drive.  No driving for 24 hours minimum. No heavy activities  Diet: Regular. Drink plenty of fluids (Water. Avoid sugar/sodas/diet sodas)  Wound Care: Keep dressing clean & dry. Change top dressing with gauze as needed. Do not remove yellow dressing (this is sutured in place).   Special Instructions: Call Doctor if any unusual problems occur such as pain, excessive bleeding, unrelieved Nausea/vomiting, Fever &/or chills Call with questions  Follow-up appointment: Scheduled for next week.   Post Anesthesia Home Care Instructions  Activity: Get plenty of rest for the remainder of the day. A responsible individual must stay with you for 24 hours following the procedure.  For the next 24 hours, DO NOT: -Drive a car -Paediatric nurse -Drink alcoholic beverages -Take any medication unless instructed by your physician -Make any legal decisions or sign important papers.  Meals: Start with liquid foods such as gelatin or soup. Progress to regular foods as tolerated. Avoid greasy, spicy, heavy foods. If nausea and/or vomiting occur, drink only clear liquids until the nausea and/or vomiting subsides. Call your physician if vomiting continues.  Special Instructions/Symptoms: Your throat may feel dry or sore from the anesthesia or the breathing tube placed in your throat during surgery. If this causes discomfort, gargle with warm salt water. The discomfort should disappear within 24 hours.  If you had a scopolamine patch placed behind your ear for the management of post- operative nausea and/or vomiting:  1. The medication in the patch is effective for 72 hours, after which it should be removed.  Wrap patch in a tissue and discard in the trash. Wash hands thoroughly with soap and  water. 2. You may remove the patch earlier than 72 hours if you experience unpleasant side effects which may include dry mouth, dizziness or visual disturbances. 3. Avoid touching the patch. Wash your hands with soap and water after contact with the patch.

## 2020-10-30 NOTE — Anesthesia Procedure Notes (Signed)
Procedure Name: LMA Insertion Date/Time: 10/30/2020 10:42 AM Performed by: Signe Colt, CRNA Pre-anesthesia Checklist: Patient identified, Emergency Drugs available, Suction available and Patient being monitored Patient Re-evaluated:Patient Re-evaluated prior to induction Oxygen Delivery Method: Circle System Utilized Preoxygenation: Pre-oxygenation with 100% oxygen Induction Type: IV induction Ventilation: Mask ventilation without difficulty LMA: LMA inserted LMA Size: 4.0 Number of attempts: 1 Airway Equipment and Method: bite block Placement Confirmation: positive ETCO2 Tube secured with: Tape Dental Injury: Teeth and Oropharynx as per pre-operative assessment

## 2020-10-31 ENCOUNTER — Encounter (HOSPITAL_BASED_OUTPATIENT_CLINIC_OR_DEPARTMENT_OTHER): Payer: Self-pay | Admitting: Plastic Surgery

## 2020-11-01 ENCOUNTER — Encounter: Payer: Self-pay | Admitting: Cardiology

## 2020-11-01 ENCOUNTER — Other Ambulatory Visit: Payer: Self-pay

## 2020-11-01 ENCOUNTER — Ambulatory Visit: Payer: 59 | Admitting: Cardiology

## 2020-11-01 VITALS — BP 130/74 | HR 81 | Ht 75.0 in | Wt 188.0 lb

## 2020-11-01 DIAGNOSIS — I10 Essential (primary) hypertension: Secondary | ICD-10-CM | POA: Diagnosis not present

## 2020-11-01 DIAGNOSIS — I447 Left bundle-branch block, unspecified: Secondary | ICD-10-CM

## 2020-11-01 DIAGNOSIS — E782 Mixed hyperlipidemia: Secondary | ICD-10-CM | POA: Diagnosis not present

## 2020-11-01 DIAGNOSIS — I2510 Atherosclerotic heart disease of native coronary artery without angina pectoris: Secondary | ICD-10-CM | POA: Diagnosis not present

## 2020-11-01 LAB — SURGICAL PATHOLOGY

## 2020-11-01 NOTE — Progress Notes (Signed)
Cardiology Office Note:    Date:  11/01/2020   ID:  Peter Spencer, DOB 06-Jul-1961, MRN [id number]  PCP:  Colon Branch, MD  Cardiologist:  Jenne Campus, MD    Referring MD: Colon Branch, MD   Chief Complaint  Patient presents with  . Follow-up  Cardiac wise I am doing well but I did have a problem with ice  History of Present Illness:    Peter Spencer is a 60 y.o. male with past medical history significant for coronary artery disease, years ago he did have coronary artery CT done which showed some mild to moderate stenosis of LAD.  In 2017 he had a stress test which was negative.  He also have baseline left bundle branch block.  Hypertension, dyslipidemia.  Comes today 2 months for follow-up overall doing great.  Asymptomatic from cardiac standpoint reviewed he did have retinal detachment requests quite complex surgery.  Went to surgery with no difficulties doing well right now.  He used to exercise on the regular basis he start back no problem with chest pain tightness squeezing pressure burning chest mild shortness of breath and fatigue which is probably related to some deconditioning.  Past Medical History:  Diagnosis Date  . Acquired hypothyroidism 11/18/2014  . Annual physical exam 03/01/2020  . Anxiety   . Benign essential hypertension 11/18/2014  . CAD (coronary artery disease)   . Chronic insomnia 11/18/2014  . Dupuytren's contracture of left hand 11/25/2019  . GERD (gastroesophageal reflux disease)   . GERD without esophagitis 10/23/2018  . Hyperlipidemia   . Hypertension    no meds now  . Hypertensive urgency 09/28/2013  . Hypothyroidism   . LBBB (left bundle branch block) 09/29/2013  . Left bundle branch block   . OSA (obstructive sleep apnea)   . PCP NOTES >>>>>>>>>>>>>> 03/02/2020  . Retinal detachment 06/2018   Right  . Retinal detachment, right 11/25/2019  . Seasonal allergies   . Thyroid disease     Past Surgical History:  Procedure Laterality Date  .  BASAL CELL CARCINOMA EXCISION N/A 10/30/2020   Procedure: Excision of scalp squamous cell carcinoma with frozen section, application of integra;  Surgeon: Cindra Presume, MD;  Location: [facility];  Service: Plastics;  Laterality: N/A;  1 hour, please  . 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 FASCIECTOMY Left 07/11/2016   Procedure: FASCIECTOMY left index possible joint release;  Surgeon: Daryll Brod, MD;  Location: [facility];  Service: Orthopedics;  Laterality: Left;  FASCIECTOMY left index possible joint release  . KNEE SURGERY    . Left eye retna repaired     . MOHS SURGERY  2017  . NASAL FRACTURE SURGERY    . RETINAL DETACHMENT SURGERY Right 06/2018  . TOE SURGERY    . TONSILLECTOMY      Current Medications: Current Meds  Medication Sig  . aspirin EC 81 MG tablet Take 81 mg by mouth daily.  Marland Kitchen azelastine (ASTELIN) 0.1 % nasal spray Place 2 sprays into both nostrils at bedtime as needed for rhinitis or allergies. Use in each nostril as directed  . Cholecalciferol (VITAMIN D) 125 MCG (5000 UT) CAPS Take 5,000 Units by mouth daily.  Marland Kitchen doxycycline (VIBRAMYCIN) 100 MG capsule Take 50 mg by mouth daily.  . fexofenadine (ALLEGRA) 180 MG tablet Take 180 mg by mouth at bedtime.  . fluticasone (FLONASE) 50 MCG/ACT nasal spray Place into both nostrils as needed for allergies or rhinitis.  Marland Kitchen  HYDROcodone-acetaminophen (NORCO) 7.5-325 MG tablet Take 1 tablet by mouth every 6 (six) hours as needed for up to 5 days for severe pain (Severe pain, post op pain only.).  Marland Kitchen hydrocortisone 2.5 % lotion Apply 1 application topically daily.  Marland Kitchen levothyroxine (SYNTHROID) 112 MCG tablet Take 1 tablet (112 mcg total) by mouth daily before breakfast.  . magnesium oxide (MAG-OX) 400 MG tablet Take 400 mg by mouth daily.  . metroNIDAZOLE (METROGEL) 0.75 % gel Apply 1 application topically as needed (rash).  . Multiple Vitamin (MULTIVITAMIN WITH MINERALS) TABS tablet Take 1 tablet  by mouth daily. Unknown stregnth  . rosuvastatin (CRESTOR) 20 MG tablet Take 1 tablet (20 mg total) by mouth daily.  . Zolpidem Tartrate 3.5 MG SUBL PLACE ONE TABLET UNDER THE TONGUE EVERY NIGHT AT BEDTIME (Patient taking differently: Take 1 tablet by mouth daily. qd)     Allergies:   Dust mite extract, Mold extract [trichophyton], Tetracyclines & related, and Sulfa antibiotics   Social History   Socioeconomic History  . Marital status: Married    Spouse name: Not on file  . Number of children: 2  . Years of education: Not on file  . Highest education level: Not on file  Occupational History  . Occupation: self employed   . Occupation: HR consultant, [employer]  Tobacco Use  . Smoking status: Never Smoker  . Smokeless tobacco: Never Used  Vaping Use  . Vaping Use: Never used  Substance and Sexual Activity  . Alcohol use: Yes    Comment: one glass of wine 4 nights a week  . Drug use: No  . Sexual activity: Not on file  Other Topics Concern  . Not on file  Social History Narrative  . Not on file   Social Determinants of Health   Financial Resource Strain: Not on file  Food Insecurity: Not on file  Transportation Needs: Not on file  Physical Activity: Not on file  Stress: Not on file  Social Connections: Not on file     Family History: The patient's family history includes Atrial fibrillation in his father; CAD in an other family member; Dementia in his father; Pancreatic cancer in his mother; Parkinsonism in his father. There is no history of Stroke, Diabetes Mellitus II, Colon cancer, or Prostate cancer. ROS:   Please see the history of present illness.    All 14 point review of systems negative except as described per history of present illness  EKGs/Labs/Other Studies Reviewed:      Recent Labs: 12/20/2019: BUN 15; Creatinine, Ser 0.99; Hemoglobin 15.0; Platelets 107.0; Potassium 4.5; Sodium 137 03/01/2020: ALT 17; TSH 2.84  Recent Lipid Panel    Component Value  Date/Time   CHOL 140 03/01/2020 1144   CHOL 163 08/18/2019 0827   TRIG 84.0 03/01/2020 1144   HDL 50.10 03/01/2020 1144   HDL 57 08/18/2019 0827   CHOLHDL 3 03/01/2020 1144   VLDL 16.8 03/01/2020 1144   LDLCALC 73 03/01/2020 1144   LDLCALC 92 08/18/2019 0827    Physical Exam:    VS:  BP 130/74 (BP Location: Right Arm, Patient Position: Sitting)   Pulse 81   Ht 6\' 3"  (1.905 m)   Wt 188 lb (85.3 kg)   SpO2 98%   BMI 23.50 kg/m     Wt Readings from Last 3 Encounters:  11/01/20 188 lb (85.3 kg)  10/30/20 189 lb 9.5 oz (86 kg)  10/05/20 190 lb (86.2 kg)     GEN:  Well nourished,  well developed in no acute distress HEENT: Normal NECK: No JVD; No carotid bruits LYMPHATICS: No lymphadenopathy CARDIAC: RRR, no murmurs, no rubs, no gallops RESPIRATORY:  Clear to auscultation without rales, wheezing or rhonchi  ABDOMEN: Soft, non-tender, non-distended MUSCULOSKELETAL:  No edema; No deformity  SKIN: Warm and dry LOWER EXTREMITIES: no swelling NEUROLOGIC:  Alert and oriented x 3 PSYCHIATRIC:  Normal affect   ASSESSMENT:    1. Left bundle branch block   2. CAD in native artery   3. Primary hypertension   4. Mixed hyperlipidemia    PLAN:    In order of problems listed above:  1. Left bundle branch block his EKG reconfirmed that.  Will deck get echocardiogram to make sure left ventricle ejection fraction is preserved. 2. Coronary disease with calcification of LAD.  Back negative stress test completely asymptomatic the key is risk factors modifications.  He is on antiplatelet therapy which I will continue as well as an statin. 3. Essential hypertension blood pressure well controlled. 4. Dyslipidemia I did review his K PN which show me his LDL of 72 and HDL 42 this is from September 26, 2020.  I will recheck his fasting lipid profile today.   Medication Adjustments/Labs and Tests Ordered: Current medicines are reviewed at length with the patient today.  Concerns regarding  medicines are outlined above.  No orders of the defined types were placed in this encounter.  Medication changes: No orders of the defined types were placed in this encounter.   Signed, Park Liter, MD, [facility] 11/01/2020 9:47 AM    [place]

## 2020-11-01 NOTE — Addendum Note (Signed)
Addended by: Senaida Ores on: 11/01/2020 09:50 AM   Modules accepted: Orders

## 2020-11-01 NOTE — Patient Instructions (Signed)
Medication Instructions:  Your physician recommends that you continue on your current medications as directed. Please refer to the Current Medication list given to you today.  *If you need a refill on your cardiac medications before your next appointment, please call your pharmacy*   Lab Work: Your physician recommends that you return for lab work today: lipid   If you have labs (blood work) drawn today and your tests are completely normal, you will receive your results only by: Marland Kitchen MyChart Message (if you have MyChart) OR . A paper copy in the mail If you have any lab test that is abnormal or we need to change your treatment, we will call you to review the results.   Testing/Procedures: Your physician has requested that you have an echocardiogram. Echocardiography is a painless test that uses sound waves to create images of your heart. It provides your doctor with information about the size and shape of your heart and how well your heart's chambers and valves are working. This procedure takes approximately one hour. There are no restrictions for this procedure.     Follow-Up: At [facility], you and your health needs are our priority.  As part of our continuing mission to provide you with exceptional heart care, we have created designated Provider Care Teams.  These Care Teams include your primary Cardiologist (physician) and Advanced Practice Providers (APPs -  Physician Assistants and Nurse Practitioners) who all work together to provide you with the care you need, when you need it.  We recommend signing up for the patient portal called "MyChart".  Sign up information is provided on this After Visit Summary.  MyChart is used to connect with patients for Virtual Visits (Telemedicine).  Patients are able to view lab/test results, encounter notes, upcoming appointments, etc.  Non-urgent messages can be sent to your provider as well.   To learn more about what you can do with MyChart, go to  [email or web address].    Your next appointment:   1 year(s)  The format for your next appointment:   In Person  Provider:   Jenne Campus, MD   Other Instructions   Echocardiogram An echocardiogram is a test that uses sound waves (ultrasound) to produce images of the heart. Images from an echocardiogram can provide important information about:  Heart size and shape.  The size and thickness and movement of your heart's walls.  Heart muscle function and strength.  Heart valve function or if you have stenosis. Stenosis is when the heart valves are too narrow.  If blood is flowing backward through the heart valves (regurgitation).  A tumor or infectious growth around the heart valves.  Areas of heart muscle that are not working well because of poor blood flow or injury from a heart attack.  Aneurysm detection. An aneurysm is a weak or damaged part of an artery wall. The wall bulges out from the normal force of blood pumping through the body. Tell a health care provider about:  Any allergies you have.  All medicines you are taking, including vitamins, herbs, eye drops, creams, and over-the-counter medicines.  Any blood disorders you have.  Any surgeries you have had.  Any medical conditions you have.  Whether you are pregnant or may be pregnant. What are the risks? Generally, this is a safe test. However, problems may occur, including an allergic reaction to dye (contrast) that may be used during the test. What happens before the test? No specific preparation is needed. You may eat and drink  normally. What happens during the test?  You will take off your clothes from the waist up and put on a hospital gown.  Electrodes or electrocardiogram (ECG)patches may be placed on your chest. The electrodes or patches are then connected to a device that monitors your heart rate and rhythm.  You will lie down on a table for an ultrasound exam. A gel will be applied to  your chest to help sound waves pass through your skin.  A handheld device, called a transducer, will be pressed against your chest and moved over your heart. The transducer produces sound waves that travel to your heart and bounce back (or "echo" back) to the transducer. These sound waves will be captured in real-time and changed into images of your heart that can be viewed on a video monitor. The images will be recorded on a computer and reviewed by your health care provider.  You may be asked to change positions or hold your breath for a short time. This makes it easier to get different views or better views of your heart.  In some cases, you may receive contrast through an IV in one of your veins. This can improve the quality of the pictures from your heart. The procedure may vary among health care providers and hospitals.   What can I expect after the test? You may return to your normal, everyday life, including diet, activities, and medicines, unless your health care provider tells you not to do that. Follow these instructions at home:  It is up to you to get the results of your test. Ask your health care provider, or the department that is doing the test, when your results will be ready.  Keep all follow-up visits. This is important. Summary  An echocardiogram is a test that uses sound waves (ultrasound) to produce images of the heart.  Images from an echocardiogram can provide important information about the size and shape of your heart, heart muscle function, heart valve function, and other possible heart problems.  You do not need to do anything to prepare before this test. You may eat and drink normally.  After the echocardiogram is completed, you may return to your normal, everyday life, unless your health care provider tells you not to do that. This information is not intended to replace advice given to you by your health care provider. Make sure you discuss any questions you have  with your health care provider. Document Revised: 03/07/2020 Document Reviewed: 03/07/2020 Elsevier Patient Education  2021 [employer].

## 2020-11-02 LAB — LIPID PANEL
Chol/HDL Ratio: 3.1 ratio (ref 0.0–5.0)
Cholesterol, Total: 176 mg/dL (ref 100–199)
HDL: 57 mg/dL (ref >39)
LDL Chol Calc (NIH): 99 mg/dL (ref 0–99)
Triglycerides: 109 mg/dL (ref 0–149)
VLDL Cholesterol Cal: 20 mg/dL (ref 5–40)

## 2020-11-03 ENCOUNTER — Telehealth: Payer: Self-pay

## 2020-11-03 DIAGNOSIS — E782 Mixed hyperlipidemia: Secondary | ICD-10-CM

## 2020-11-03 MED ORDER — ROSUVASTATIN CALCIUM 40 MG PO TABS: 40.0000 mg | ORAL_TABLET | Freq: Every day | ORAL | 3 refills | Status: DC

## 2020-11-03 MED ORDER — ATORVASTATIN CALCIUM 40 MG PO TABS: 40.0000 mg | ORAL_TABLET | Freq: Every day | ORAL | 1 refills | Status: DC

## 2020-11-03 NOTE — Telephone Encounter (Signed)
Patient notified of results and recommendation and agrees with plan. Patient aware to be fasting when he come by for his labs. I confirm and corrected the prescription error(sent Lipitor 40mg  and then d/c) and resent Crestor 40 qd per Dr. Agustin Cree with Sharee Pimple the pharmacy tech.

## 2020-11-03 NOTE — Telephone Encounter (Signed)
-----   Message from Park Liter, MD sent at 11/02/2020  9:53 PM EDT ----- Cholesterol still not adequately controlled I propose to increase Crestor from 20 mg to 40 mg daily, fasting lipid profile, AST ALT in 6 weeks

## 2020-11-09 ENCOUNTER — Other Ambulatory Visit: Payer: Self-pay

## 2020-11-09 ENCOUNTER — Ambulatory Visit (INDEPENDENT_AMBULATORY_CARE_PROVIDER_SITE_OTHER): Payer: 59 | Admitting: Plastic Surgery

## 2020-11-09 DIAGNOSIS — D049 Carcinoma in situ of skin, unspecified: Secondary | ICD-10-CM | POA: Diagnosis not present

## 2020-11-09 NOTE — Progress Notes (Signed)
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  He feels good overall.  On exam everything is healing fine and the bolster was removed.  Pathology was reviewed and showed negative margins on final analysis.  We will plan to leave him be with wound care for another few weeks and then I will check him again.  I anticipate this will be mostly healed by that point.  All of his questions were answered.

## 2020-11-15 ENCOUNTER — Telehealth: Payer: Self-pay | Admitting: Internal Medicine

## 2020-11-15 DIAGNOSIS — Z114 Encounter for screening for human immunodeficiency virus [HIV]: Secondary | ICD-10-CM

## 2020-11-15 DIAGNOSIS — Z125 Encounter for screening for malignant neoplasm of prostate: Secondary | ICD-10-CM

## 2020-11-15 DIAGNOSIS — E782 Mixed hyperlipidemia: Secondary | ICD-10-CM

## 2020-11-15 DIAGNOSIS — E039 Hypothyroidism, unspecified: Secondary | ICD-10-CM

## 2020-11-15 DIAGNOSIS — I10 Essential (primary) hypertension: Secondary | ICD-10-CM

## 2020-11-15 DIAGNOSIS — Z1159 Encounter for screening for other viral diseases: Secondary | ICD-10-CM

## 2020-11-15 NOTE — Telephone Encounter (Signed)
Patient would like a labs ordered prior to appointment on 11/24/2020 , so he can discuss results with Dr Larose Kells at appointment

## 2020-11-15 NOTE — Telephone Encounter (Signed)
Orders placed. Please schedule lab appt several days prior to his visit on 11/24/20. Thank you.

## 2020-11-15 NOTE — Telephone Encounter (Signed)
CMP, CBC, TSH, free T4 (increased TSH), PSA. Also if he qualify for hep C and HIV screening >>>>>> please proceed.

## 2020-11-15 NOTE — Telephone Encounter (Signed)
Please advise 

## 2020-11-20 NOTE — Telephone Encounter (Signed)
appt scheduled with pt

## 2020-11-22 ENCOUNTER — Other Ambulatory Visit: Payer: Self-pay

## 2020-11-22 ENCOUNTER — Other Ambulatory Visit (INDEPENDENT_AMBULATORY_CARE_PROVIDER_SITE_OTHER): Payer: 59

## 2020-11-22 DIAGNOSIS — I10 Essential (primary) hypertension: Secondary | ICD-10-CM | POA: Diagnosis not present

## 2020-11-22 DIAGNOSIS — Z125 Encounter for screening for malignant neoplasm of prostate: Secondary | ICD-10-CM

## 2020-11-22 DIAGNOSIS — E039 Hypothyroidism, unspecified: Secondary | ICD-10-CM

## 2020-11-22 DIAGNOSIS — Z1159 Encounter for screening for other viral diseases: Secondary | ICD-10-CM

## 2020-11-22 DIAGNOSIS — Z114 Encounter for screening for human immunodeficiency virus [HIV]: Secondary | ICD-10-CM

## 2020-11-22 LAB — CBC WITH DIFFERENTIAL/PLATELET
Basophils Absolute: 0 10*3/uL (ref 0.0–0.1)
Basophils Relative: 0.3 % (ref 0.0–3.0)
Eosinophils Absolute: 0.1 10*3/uL (ref 0.0–0.7)
Eosinophils Relative: 1.7 % (ref 0.0–5.0)
HCT: 41.6 % (ref 39.0–52.0)
Hemoglobin: 14.5 g/dL (ref 13.0–17.0)
Lymphocytes Relative: 34.2 % (ref 12.0–46.0)
Lymphs Abs: 1 10*3/uL (ref 0.7–4.0)
MCHC: 34.9 g/dL (ref 30.0–36.0)
MCV: 91.6 fl (ref 78.0–100.0)
Monocytes Absolute: 0.3 10*3/uL (ref 0.1–1.0)
Monocytes Relative: 11.4 % (ref 3.0–12.0)
Neutro Abs: 1.6 10*3/uL (ref 1.4–7.7)
Neutrophils Relative %: 52.4 % (ref 43.0–77.0)
Platelets: 103 10*3/uL — ABNORMAL LOW (ref 150.0–400.0)
RBC: 4.55 Mil/uL (ref 4.22–5.81)
RDW: 13.6 % (ref 11.5–15.5)
WBC: 3 10*3/uL — ABNORMAL LOW (ref 4.0–10.5)

## 2020-11-22 LAB — PSA: PSA: 0.63 ng/mL (ref 0.10–4.00)

## 2020-11-22 LAB — COMPREHENSIVE METABOLIC PANEL
ALT: 24 U/L (ref 0–53)
AST: 20 U/L (ref 0–37)
Albumin: 4.3 g/dL (ref 3.5–5.2)
Alkaline Phosphatase: 54 U/L (ref 39–117)
BUN: 20 mg/dL (ref 6–23)
CO2: 27 mEq/L (ref 19–32)
Calcium: 8.9 mg/dL (ref 8.4–10.5)
Chloride: 102 mEq/L (ref 96–112)
Creatinine, Ser: 1.06 mg/dL (ref 0.40–1.50)
GFR: 75.35 mL/min (ref >60.00)
Glucose, Bld: 82 mg/dL (ref 70–99)
Potassium: 4.6 mEq/L (ref 3.5–5.1)
Sodium: 136 mEq/L (ref 135–145)
Total Bilirubin: 0.8 mg/dL (ref 0.2–1.2)
Total Protein: 6.2 g/dL (ref 6.0–8.3)

## 2020-11-22 LAB — T4, FREE: Free T4: 1.44 ng/dL (ref 0.60–1.60)

## 2020-11-22 LAB — TSH: TSH: 1.93 u[IU]/mL (ref 0.35–4.50)

## 2020-11-23 LAB — HEPATITIS C ANTIBODY
Hepatitis C Ab: NONREACTIVE
SIGNAL TO CUT-OFF: 0.01 (ref <1.00)

## 2020-11-23 LAB — HIV ANTIBODY (ROUTINE TESTING W REFLEX): HIV 1&2 Ab, 4th Generation: NONREACTIVE

## 2020-11-24 ENCOUNTER — Other Ambulatory Visit: Payer: Self-pay

## 2020-11-24 ENCOUNTER — Ambulatory Visit (INDEPENDENT_AMBULATORY_CARE_PROVIDER_SITE_OTHER): Payer: 59

## 2020-11-24 ENCOUNTER — Ambulatory Visit (INDEPENDENT_AMBULATORY_CARE_PROVIDER_SITE_OTHER): Payer: 59 | Admitting: Internal Medicine

## 2020-11-24 ENCOUNTER — Encounter: Payer: Self-pay | Admitting: Internal Medicine

## 2020-11-24 VITALS — Ht 75.0 in

## 2020-11-24 DIAGNOSIS — E782 Mixed hyperlipidemia: Secondary | ICD-10-CM | POA: Diagnosis not present

## 2020-11-24 DIAGNOSIS — I10 Essential (primary) hypertension: Secondary | ICD-10-CM | POA: Diagnosis not present

## 2020-11-24 DIAGNOSIS — I447 Left bundle-branch block, unspecified: Secondary | ICD-10-CM | POA: Diagnosis not present

## 2020-11-24 DIAGNOSIS — I2510 Atherosclerotic heart disease of native coronary artery without angina pectoris: Secondary | ICD-10-CM | POA: Diagnosis not present

## 2020-11-24 DIAGNOSIS — Z538 Procedure and treatment not carried out for other reasons: Secondary | ICD-10-CM

## 2020-11-24 LAB — ECHOCARDIOGRAM COMPLETE
Area-P 1/2: 4.54 cm2
Calc EF: 42 %
S' Lateral: 3.6 cm
Single Plane A2C EF: 38.1 %
Single Plane A4C EF: 39.2 %

## 2020-11-24 NOTE — Progress Notes (Signed)
Complete echocardiogram performed.  Jimmy Editha Bridgeforth RDCS, RVT  

## 2020-11-28 ENCOUNTER — Other Ambulatory Visit: Payer: Self-pay

## 2020-11-28 ENCOUNTER — Encounter: Payer: Self-pay | Admitting: Internal Medicine

## 2020-11-28 ENCOUNTER — Telehealth: Payer: Self-pay | Admitting: Cardiology

## 2020-11-28 ENCOUNTER — Ambulatory Visit (INDEPENDENT_AMBULATORY_CARE_PROVIDER_SITE_OTHER): Payer: 59 | Admitting: Internal Medicine

## 2020-11-28 VITALS — BP 162/90 | HR 79 | Temp 98.1°F | Resp 16 | Ht 75.0 in | Wt 181.4 lb

## 2020-11-28 DIAGNOSIS — R9430 Abnormal result of cardiovascular function study, unspecified: Secondary | ICD-10-CM

## 2020-11-28 DIAGNOSIS — F5104 Psychophysiologic insomnia: Secondary | ICD-10-CM

## 2020-11-28 DIAGNOSIS — D696 Thrombocytopenia, unspecified: Secondary | ICD-10-CM

## 2020-11-28 DIAGNOSIS — E782 Mixed hyperlipidemia: Secondary | ICD-10-CM

## 2020-11-28 DIAGNOSIS — I10 Essential (primary) hypertension: Secondary | ICD-10-CM

## 2020-11-28 DIAGNOSIS — I447 Left bundle-branch block, unspecified: Secondary | ICD-10-CM

## 2020-11-28 DIAGNOSIS — Z1211 Encounter for screening for malignant neoplasm of colon: Secondary | ICD-10-CM

## 2020-11-28 MED ORDER — LEVOTHYROXINE SODIUM 112 MCG PO TABS: 112.0000 ug | ORAL_TABLET | Freq: Every day | ORAL | 1 refills | Status: DC

## 2020-11-28 MED ORDER — ZOLPIDEM TARTRATE 5 MG PO TABS: 5.0000 mg | ORAL_TABLET | Freq: Every evening | ORAL | 1 refills | Status: DC | PRN

## 2020-11-28 NOTE — Telephone Encounter (Signed)
Patient informed of results and scheduled for follow up  

## 2020-11-28 NOTE — Patient Instructions (Addendum)
Check the  blood pressure every day and keep a log   BP GOAL is between 110/65 and  135/85. If it is consistently higher or lower, let me know    [place], [place] back for a physical exam by 9- 2022

## 2020-11-28 NOTE — Progress Notes (Signed)
Subjective:    Patient ID: Peter Spencer, male    DOB: May 21, 1961, 60 y.o.   MRN: [id number]  DOS:  11/28/2020 Type of visit - description: Follow-up  Here to discuss all his chronic medical problems. He recently saw cardiology, note and results reviewed.   BP Readings from Last 3 Encounters:  11/28/20 (!) 162/90  11/01/20 130/74  10/30/20 (!) 152/90     Review of Systems See above   Past Medical History:  Diagnosis Date  . Acquired hypothyroidism 11/18/2014  . Annual physical exam 03/01/2020  . Anxiety   . Benign essential hypertension 11/18/2014  . CAD (coronary artery disease)   . Chronic insomnia 11/18/2014  . Dupuytren's contracture of left hand 11/25/2019  . GERD (gastroesophageal reflux disease)   . GERD without esophagitis 10/23/2018  . Hyperlipidemia   . Hypertension    no meds now  . Hypertensive urgency 09/28/2013  . Hypothyroidism   . LBBB (left bundle branch block) 09/29/2013  . Left bundle branch block   . OSA (obstructive sleep apnea)   . PCP NOTES >>>>>>>>>>>>>> 03/02/2020  . Retinal detachment 06/2018   Right  . Retinal detachment, right 11/25/2019  . Seasonal allergies   . Thyroid disease     Past Surgical History:  Procedure Laterality Date  . BASAL CELL CARCINOMA EXCISION N/A 10/30/2020   Procedure: Excision of scalp squamous cell carcinoma with frozen section, application of integra;  Surgeon: Cindra Presume, MD;  Location: [place];  Service: Plastics;  Laterality: N/A;  1 hour, please  . 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 FASCIECTOMY Left 07/11/2016   Procedure: FASCIECTOMY left index possible joint release;  Surgeon: Daryll Brod, MD;  Location: [place];  Service: Orthopedics;  Laterality: Left;  FASCIECTOMY left index possible joint release  . KNEE SURGERY    . Left eye retna repaired     . MOHS SURGERY  2017  . NASAL FRACTURE SURGERY    . RETINAL DETACHMENT SURGERY Right 06/2018  . TOE SURGERY     . TONSILLECTOMY      Allergies as of 11/28/2020      Reactions   Dust Mite Extract    Mold Extract [trichophyton]    Tetracyclines & Related Other (See Comments)   Caused liver enzymes to go up   Sulfa Antibiotics Nausea And Vomiting, Nausea Only      Medication List       Accurate as of Nov 28, 2020 11:59 PM. If you have any questions, ask your nurse or doctor.        STOP taking these medications   Zolpidem Tartrate 3.5 MG Subl Replaced by: zolpidem 5 MG tablet Stopped by: Kathlene November, MD     TAKE these medications   aspirin EC 81 MG tablet Take 81 mg by mouth daily.   azelastine 0.1 % nasal spray Commonly known as: ASTELIN Place 2 sprays into both nostrils at bedtime as needed for rhinitis or allergies. Use in each nostril as directed   doxycycline 100 MG capsule Commonly known as: VIBRAMYCIN Take 50 mg by mouth daily.   fexofenadine 180 MG tablet Commonly known as: ALLEGRA Take 180 mg by mouth at bedtime.   fluticasone 50 MCG/ACT nasal spray Commonly known as: FLONASE Place into both nostrils as needed for allergies or rhinitis.   hydrocortisone 2.5 % lotion Apply 1 application topically daily.   levothyroxine 112 MCG tablet Commonly known as: SYNTHROID Take 1 tablet (112  mcg total) by mouth daily before breakfast.   magnesium oxide 400 MG tablet Commonly known as: MAG-OX Take 400 mg by mouth daily.   metroNIDAZOLE 0.75 % gel Commonly known as: METROGEL Apply 1 application topically as needed (rash).   multivitamin with minerals Tabs tablet Take 1 tablet by mouth daily. Unknown stregnth   rosuvastatin 40 MG tablet Commonly known as: CRESTOR Take 1 tablet (40 mg total) by mouth daily.   Vitamin D 125 MCG (5000 UT) Caps Take 5,000 Units by mouth daily.   zolpidem 5 MG tablet Commonly known as: AMBIEN Take 1 tablet (5 mg total) by mouth at bedtime as needed for sleep. Replaces: Zolpidem Tartrate 3.5 MG Subl Started by: Kathlene November, MD           Objective:   Physical Exam BP (!) 162/90 (BP Location: Left Arm, Patient Position: Sitting, Cuff Size: Small)   Pulse 79   Temp 98.1 F (36.7 C) (Oral)   Resp 16   Ht 6' 3"  (1.905 m)   Wt 181 lb 6 oz (82.3 kg)   SpO2 97%   BMI 22.67 kg/m  General:   Well developed, NAD, BMI noted. HEENT:  Normocephalic . Face symmetric, atraumatic Lungs:  CTA B Normal respiratory effort, no intercostal retractions, no accessory muscle use. Heart: RRR,  no murmur.  Lower extremities: no pretibial edema bilaterally  Skin: Not pale. Not jaundice Neurologic:  alert & oriented X3.  Speech normal, gait appropriate for age and unassisted Psych--  Cognition and judgment appear intact.  Cooperative with normal attention span and concentration.  Behavior appropriate. No anxious or depressed appearing.      Assessment     Assessment (new patient 11/2019) HTN, history of hypertensive urgency High cholesterol Thyroid disease CAD, LBBB (+ LAD disease per coronary CT, neg a stress test 2017) OSA - borderline per pt  Dupuytren's Right retinal detachment 2019 [place] Rosacea dx ~ 2017, seen @ [place] derm, increased LFTs w/ tetracycline per chart review Retinal detachment , surgery R 22019, L 05-2020 Thrombocytopenia: w/u in [facility] ~ 1990s, early 2000s, including a BM Bx , ITP ?  PLAN HTN: Currently on lifestyle control.  BP today slightly elevated, he has an appointment to see cardiology soon, we agreed he will check his blood pressure daily and bring the log to see cardiology. High cholesterol: Last LDL few days ago was 99, cardiology rec to increase Crestor. CAD, LBBB Saw cardiology 11/01/2020, asymptomatic Low EF: Had an echo recently, EF decreased from 60% few years ago to 45%, the patient is extremely concerned, multiple questions answered to the best of my ability,  I advised patient that for further questioning he needs to speak with cardiology. Insomnia: Sublingual zolpidem very expensive,  requested to change to zolpidem 5 mg, Rx printed Hypothyroidism: Controlled, Rx printed Colonoscopy: Due, refer to GI.  Unable to go back to [employer] [facility] due to insurance issues Thrombocytopenia: He has thrombocytopenia, last WBC is slightly low as well, patient reports that he had extensive work-up about 20 years ago including a bone marrow biopsy, ITP?  We debated between rechecking in few months versus referral to hematology, elected to wait and see. Retinal detachment: Had a major surgery few months ago, patient is greatly recovered. RTC 4 months CPX   Today I spent 32 minutes with the patient, he explained in great detail his experience about the retinal detachment and recent Mohs surgery.  Extensive questions about low EF, thrombocytopenia, insomnia, etc.  Multiple questions  answered to the best of my ability.   This visit occurred during the SARS-CoV-2 public health emergency.  Safety protocols were in place, including screening questions prior to the visit, additional usage of staff PPE, and extensive cleaning of exam room while observing appropriate contact time as indicated for disinfecting solutions.

## 2020-11-28 NOTE — Telephone Encounter (Signed)
Follow up:   Patient returning a call back. Patient will be in meetings for the next 4 hours. Patient need to speak with the nurse.

## 2020-11-29 ENCOUNTER — Ambulatory Visit (INDEPENDENT_AMBULATORY_CARE_PROVIDER_SITE_OTHER): Payer: 59 | Admitting: Plastic Surgery

## 2020-11-29 DIAGNOSIS — L989 Disorder of the skin and subcutaneous tissue, unspecified: Secondary | ICD-10-CM

## 2020-11-29 DIAGNOSIS — D696 Thrombocytopenia, unspecified: Secondary | ICD-10-CM

## 2020-11-29 HISTORY — DX: Thrombocytopenia, unspecified: D69.6

## 2020-11-29 NOTE — Progress Notes (Signed)
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  He feels like things are going well.  On exam this looks to be healing fine.  The silicone outer layer was removed along with all residual stitches.  The Integra is well incorporated and well perfused.  I reapplied some ointment and a Band-Aid.  He will continue to do this until it totally heals over.  I will plan to see him again in about a month and we can always modify that appointment if he needs to based on his schedule.

## 2020-11-29 NOTE — Assessment & Plan Note (Signed)
HTN: Currently on lifestyle control.  BP today slightly elevated, he has an appointment to see cardiology soon, we agreed he will check his blood pressure daily and bring the log to see cardiology. High cholesterol: Last LDL few days ago was 99, cardiology rec to increase Crestor. CAD, LBBB Saw cardiology 11/01/2020, asymptomatic Low EF: Had an echo recently, EF decreased from 60% few years ago to 45%, the patient is extremely concerned, multiple questions answered to the best of my ability,  I advised patient that for further questioning he needs to speak with cardiology. Insomnia: Sublingual zolpidem very expensive, requested to change to zolpidem 5 mg, Rx printed Hypothyroidism: Controlled, Rx printed Colonoscopy: Due, refer to GI.  Unable to go back to [employer] due to insurance issues Thrombocytopenia: He has thrombocytopenia, last WBC is slightly low as well, patient reports that he had extensive work-up about 20 years ago including a bone marrow biopsy, ITP?  We debated between rechecking in few months versus referral to hematology, elected to wait and see. Retinal detachment: Had a major surgery few months ago, patient is greatly recovered. RTC 4 months CPX

## 2020-12-05 ENCOUNTER — Ambulatory Visit (INDEPENDENT_AMBULATORY_CARE_PROVIDER_SITE_OTHER): Payer: 59 | Admitting: Cardiology

## 2020-12-05 ENCOUNTER — Other Ambulatory Visit: Payer: Self-pay

## 2020-12-05 VITALS — BP 160/100 | HR 93 | Ht 75.0 in | Wt 188.0 lb

## 2020-12-05 DIAGNOSIS — I447 Left bundle-branch block, unspecified: Secondary | ICD-10-CM

## 2020-12-05 DIAGNOSIS — I10 Essential (primary) hypertension: Secondary | ICD-10-CM

## 2020-12-05 DIAGNOSIS — E782 Mixed hyperlipidemia: Secondary | ICD-10-CM | POA: Diagnosis not present

## 2020-12-05 NOTE — Progress Notes (Signed)
Cardiology Office Note:    Date:  12/05/2020   ID:  Peter Spencer, DOB Mar 01, 1961, MRN [id number]  PCP:  Colon Branch, MD  Cardiologist:  Jenne Campus, MD    Referring MD: Colon Branch, MD   Chief Complaint  Patient presents with  . Results    History of Present Illness:    Peter Spencer is a 60 y.o. male with past medical history significant for coronary artery disease years ago he did have coronary CT which showed some mild to moderate stenosis of LAD.  In 2017 he ended having stress test which was negative.  He does have baseline left bundle branch block, essential hypertension, dyslipidemia.  We did schedule him to have an echocardiogram which he had done which showed ejection fraction being 4045% and he is here to talk about his we had a long discussion with him as well as with his wife who was on the phone he is will want to do some [place] program to help improve his overall wellbeing.  Luckily he denies have any chest pain tightness squeezing pressure burning chest.  He exercise a lot but he has no difficulty doing it overall he seems to be doing well there is no swelling of lower extremities there is no palpitations.  Past Medical History:  Diagnosis Date  . Acquired hypothyroidism 11/18/2014  . Annual physical exam 03/01/2020  . Anxiety   . Benign essential hypertension 11/18/2014  . CAD (coronary artery disease)   . CAD in native artery 10/23/2018  . Chronic insomnia 11/18/2014  . Dupuytren's contracture of left hand 11/25/2019  . GERD (gastroesophageal reflux disease)   . GERD without esophagitis 10/23/2018  . Hyperlipidemia   . Hypertension    no meds now  . Hypertensive urgency 09/28/2013  . Hypothyroidism   . LBBB (left bundle branch block) 09/29/2013  . Left bundle branch block   . OSA (obstructive sleep apnea)   . PCP NOTES >>>>>>>>>>>>>> 03/02/2020  . Retinal detachment 06/2018   Right  . Retinal detachment, right 11/25/2019  . Seasonal allergies   .  Thrombocytopenia (HCC)--history of, reports extensive w/u (early 2000, ITP?) 11/29/2020  . Thyroid disease     Past Surgical History:  Procedure Laterality Date  . BASAL CELL CARCINOMA EXCISION N/A 10/30/2020   Procedure: Excision of scalp squamous cell carcinoma with frozen section, application of integra;  Surgeon: Cindra Presume, MD;  Location: [facility];  Service: Plastics;  Laterality: N/A;  1 hour, please  . 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 FASCIECTOMY Left 07/11/2016   Procedure: FASCIECTOMY left index possible joint release;  Surgeon: Daryll Brod, MD;  Location: [facility];  Service: Orthopedics;  Laterality: Left;  FASCIECTOMY left index possible joint release  . KNEE SURGERY    . Left eye retna repaired     . MOHS SURGERY  2017  . NASAL FRACTURE SURGERY    . RETINAL DETACHMENT SURGERY Right 06/2018  . TOE SURGERY    . TONSILLECTOMY      Current Medications: Current Meds  Medication Sig  . aspirin EC 81 MG tablet Take 81 mg by mouth daily.  Marland Kitchen azelastine (ASTELIN) 0.1 % nasal spray Place 2 sprays into both nostrils at bedtime as needed for rhinitis or allergies. Use in each nostril as directed  . Cholecalciferol (VITAMIN D) 125 MCG (5000 UT) CAPS Take 5,000 Units by mouth daily.  Marland Kitchen doxycycline (VIBRAMYCIN) 100 MG capsule Take 50 mg  by mouth daily.  . fexofenadine (ALLEGRA) 180 MG tablet Take 180 mg by mouth at bedtime.  . fluticasone (FLONASE) 50 MCG/ACT nasal spray Place 1 spray into both nostrils as needed for allergies or rhinitis.  . hydrocortisone 2.5 % lotion Apply 1 application topically as needed (rocesea).  Marland Kitchen levothyroxine (SYNTHROID) 112 MCG tablet Take 1 tablet (112 mcg total) by mouth daily before breakfast.  . magnesium oxide (MAG-OX) 400 MG tablet Take 400 mg by mouth daily.  . metroNIDAZOLE (METROGEL) 0.75 % gel Apply 1 application topically as needed (rash).  . Multiple Vitamin (MULTIVITAMIN WITH MINERALS) TABS tablet  Take 1 tablet by mouth daily. Unknown stregnth  . rosuvastatin (CRESTOR) 40 MG tablet Take 1 tablet (40 mg total) by mouth daily.  Marland Kitchen zolpidem (AMBIEN) 5 MG tablet Take 1 tablet (5 mg total) by mouth at bedtime as needed for sleep.     Allergies:   Dust mite extract, Mold extract [trichophyton], Tetracyclines & related, and Sulfa antibiotics   Social History   Socioeconomic History  . Marital status: Married    Spouse name: Not on file  . Number of children: 2  . Years of education: Not on file  . Highest education level: Not on file  Occupational History  . Occupation: self employed   . Occupation: HR consultant, [employer]  Tobacco Use  . Smoking status: Never Smoker  . Smokeless tobacco: Never Used  Vaping Use  . Vaping Use: Never used  Substance and Sexual Activity  . Alcohol use: Yes    Comment: one glass of wine 4 nights a week  . Drug use: No  . Sexual activity: Not on file  Other Topics Concern  . Not on file  Social History Narrative  . Not on file   Social Determinants of Health   Financial Resource Strain: Not on file  Food Insecurity: Not on file  Transportation Needs: Not on file  Physical Activity: Not on file  Stress: Not on file  Social Connections: Not on file     Family History: The patient's family history includes Atrial fibrillation in his father; CAD in an other family member; Dementia in his father; Pancreatic cancer in his mother; Parkinsonism in his father. There is no history of Stroke, Diabetes Mellitus II, Colon cancer, or Prostate cancer. ROS:   Please see the history of present illness.    All 14 point review of systems negative except as described per history of present illness  EKGs/Labs/Other Studies Reviewed:      Recent Labs: 11/22/2020: ALT 24; BUN 20; Creatinine, Ser 1.06; Hemoglobin 14.5; Platelets 103.0; Potassium 4.6; Sodium 136; TSH 1.93  Recent Lipid Panel    Component Value Date/Time   CHOL 176 11/01/2020 0954   TRIG 109  11/01/2020 0954   HDL 57 11/01/2020 0954   CHOLHDL 3.1 11/01/2020 0954   CHOLHDL 3 03/01/2020 1144   VLDL 16.8 03/01/2020 1144   LDLCALC 99 11/01/2020 0954    Physical Exam:    VS:  BP (!) 160/100 (BP Location: Right Arm, Patient Position: Sitting)   Pulse 93   Ht 6\' 3"  (1.905 m)   Wt 188 lb (85.3 kg)   SpO2 98%   BMI 23.50 kg/m     Wt Readings from Last 3 Encounters:  12/05/20 188 lb (85.3 kg)  11/28/20 181 lb 6 oz (82.3 kg)  11/01/20 188 lb (85.3 kg)     GEN:  Well nourished, well developed in no acute distress HEENT: Normal NECK:  No JVD; No carotid bruits LYMPHATICS: No lymphadenopathy CARDIAC: RRR, no murmurs, no rubs, no gallops RESPIRATORY:  Clear to auscultation without rales, wheezing or rhonchi  ABDOMEN: Soft, non-tender, non-distended MUSCULOSKELETAL:  No edema; No deformity  SKIN: Warm and dry LOWER EXTREMITIES: no swelling NEUROLOGIC:  Alert and oriented x 3 PSYCHIATRIC:  Normal affect   ASSESSMENT:    1. Mixed hyperlipidemia   2. LBBB (left bundle branch block)   3. Benign essential hypertension    PLAN:    In order of problems listed above:  1. Left bundle branch block.  Ejection fraction 4045% which is anticipated somebody with left bundle branch block.  I offered him to start either ARB or an ACE inhibitor.  He does not want to do it right now.  He wants to start intense health management program and see how he does based on that decide what to do next. 2. Mixed dyslipidemia, I did put him on high-dose of statin.  We will make arrangements for fasting lipid profile done. 3. Benign essential hypertension blood pressure 160/100 elevated in the office today, however, he did have blood pressure measurements from home and does not always less than 140/90.  Therefore, we will continue present management.  We will continue discussion about potentially using some ARB or ACE inhibitor 4. When he will be here he is willing to go and do ordination and health  management program we will see how much she will be able to accomplish.   Medication Adjustments/Labs and Tests Ordered: Current medicines are reviewed at length with the patient today.  Concerns regarding medicines are outlined above.  Orders Placed This Encounter  Procedures  . Lipid panel   Medication changes: No orders of the defined types were placed in this encounter.   Signed, Park Liter, MD, [facility] 12/05/2020 4:20 PM    [facility]

## 2020-12-05 NOTE — Patient Instructions (Addendum)
Medication Instructions:  Your physician recommends that you continue on your current medications as directed. Please refer to the Current Medication list given to you today.  *If you need a refill on your cardiac medications before your next appointment, please call your pharmacy*   Lab Work: Your physician recommends that you return for lab work in: Lipid Profile fasting If you have labs (blood work) drawn today and your tests are completely normal, you will receive your results only by: Marland Kitchen MyChart Message (if you have MyChart) OR . A paper copy in the mail If you have any lab test that is abnormal or we need to change your treatment, we will call you to review the results.   Testing/Procedures: None   Follow-Up: At [facility], you and your health needs are our priority.  As part of our continuing mission to provide you with exceptional heart care, we have created designated Provider Care Teams.  These Care Teams include your primary Cardiologist (physician) and Advanced Practice Providers (APPs -  Physician Assistants and Nurse Practitioners) who all work together to provide you with the care you need, when you need it.  We recommend signing up for the patient portal called "MyChart".  Sign up information is provided on this After Visit Summary.  MyChart is used to connect with patients for Virtual Visits (Telemedicine).  Patients are able to view lab/test results, encounter notes, upcoming appointments, etc.  Non-urgent messages can be sent to your provider as well.   To learn more about what you can do with MyChart, go to [email or web address].    Your next appointment:   5 month(s)  The format for your next appointment:   In Person  Provider:   Jenne Campus, MD   Other Instructions

## 2020-12-06 ENCOUNTER — Telehealth: Payer: Self-pay | Admitting: Cardiology

## 2020-12-06 DIAGNOSIS — I2510 Atherosclerotic heart disease of native coronary artery without angina pectoris: Secondary | ICD-10-CM

## 2020-12-06 NOTE — Telephone Encounter (Signed)
Patient asking for a [facility] program referral. He states we are supposed to be contacted with that. I will check and let patient know.

## 2020-12-06 NOTE — Telephone Encounter (Signed)
Patient informed he needs to sign a release form for [place] to send him copy of referral. He is asking [place] to fax a form to him to sign for release of paperwork. He can get it notorized he said will check with front desk on this.

## 2020-12-06 NOTE — Telephone Encounter (Signed)
Left message for patient to return call.

## 2020-12-06 NOTE — Telephone Encounter (Signed)
Pt calling to check  if an enrollment form for cardiac rehab has been completed

## 2020-12-06 NOTE — Telephone Encounter (Signed)
Patient requesting I also fax to him. Will do this.

## 2020-12-06 NOTE — Telephone Encounter (Signed)
Referral has been sent. Patient aware.

## 2020-12-08 NOTE — Telephone Encounter (Signed)
Spoke to patient he will sign release form and upload to mychart then we will fax copy of referral to him as requested. His fax number is [phone]

## 2020-12-08 NOTE — Telephone Encounter (Signed)
Called patient left message for patient to return call 

## 2020-12-12 NOTE — Telephone Encounter (Signed)
Patient came in office and  wanted to sign release in person. Will fax him the copy of referral now.

## 2020-12-13 NOTE — Telephone Encounter (Signed)
Left message to inform patient I faxed a copy of his referral to him as requested. Also let him know that we called [facility] this morning regarding his referral. I left the information I found out on his voicemail. Apparently our referral was never received resent this morning and confirmed that I had the correct fax number for them. Also informed him they told me the next intensive cardiac rehab is to start in June and they normally call right before to get people set up. Advised him to call back with any questions.

## 2020-12-27 ENCOUNTER — Telehealth: Payer: Self-pay | Admitting: Internal Medicine

## 2020-12-27 NOTE — Telephone Encounter (Signed)
Received call around 1:15pm today from a pharmacy in [facility], [place] brought in a paper script for Ambien they needed verbal okay to fill out of state. Verbal to fill given.

## 2020-12-27 NOTE — Telephone Encounter (Signed)
Pt, called want to speak to nurse pt, did not want to elaborate on reason for call, [place] the nurse to call him at [phone]  Please advice

## 2020-12-27 NOTE — Telephone Encounter (Signed)
Tried calling Pt- no answer, unable to leave message (voice mail box full).

## 2020-12-27 NOTE — Telephone Encounter (Signed)
Pt called back- he is at [facility] and is sick, has been fighting sinus congestion/cough for 1 week, has tested negative for covid. Informed that we would be unable to send medications without visit- Dr. Larose Kells full until Friday- I recommend he go to urgent care locally where he is. Pt hung up.

## 2021-01-02 ENCOUNTER — Ambulatory Visit: Payer: 59 | Admitting: Surgical

## 2021-01-15 NOTE — Addendum Note (Signed)
Addended by: Senaida Ores on: 01/15/2021 09:29 AM   Modules accepted: Orders

## 2021-01-16 LAB — LIPID PANEL
Chol/HDL Ratio: 2.8 ratio (ref 0.0–5.0)
Cholesterol, Total: 132 mg/dL (ref 100–199)
HDL: 47 mg/dL (ref >39)
LDL Chol Calc (NIH): 69 mg/dL (ref 0–99)
Triglycerides: 83 mg/dL (ref 0–149)
VLDL Cholesterol Cal: 16 mg/dL (ref 5–40)

## 2021-01-18 ENCOUNTER — Telehealth: Payer: Self-pay | Admitting: Cardiology

## 2021-01-18 NOTE — Telephone Encounter (Signed)
     April with [facility] calling, she said she received a call from [facility] regarding [phone] and [id number] for rehab, she said they need to know exact start date so they can get an approval/authorization

## 2021-03-29 ENCOUNTER — Telehealth: Payer: Self-pay | Admitting: Cardiology

## 2021-03-29 NOTE — Telephone Encounter (Signed)
  pt is wanting labs drawn at this location... please send request to fax# [phone].

## 2021-03-30 NOTE — Telephone Encounter (Signed)
[place], [place] [address]

## 2021-03-31 LAB — ALT: ALT: 24 IU/L (ref 0–44)

## 2021-03-31 LAB — LIPID PANEL
Chol/HDL Ratio: 2.8 ratio (ref 0.0–5.0)
Cholesterol, Total: 121 mg/dL (ref 100–199)
HDL: 44 mg/dL (ref >39)
LDL Chol Calc (NIH): 61 mg/dL (ref 0–99)
Triglycerides: 78 mg/dL (ref 0–149)
VLDL Cholesterol Cal: 16 mg/dL (ref 5–40)

## 2021-03-31 LAB — AST: AST: 21 IU/L (ref 0–40)

## 2021-04-04 ENCOUNTER — Encounter: Payer: 59 | Admitting: Internal Medicine

## 2021-04-04 ENCOUNTER — Telehealth: Payer: Self-pay

## 2021-04-04 NOTE — Telephone Encounter (Signed)
-----   Message from Park Liter, MD sent at 04/04/2021 12:20 PM EDT ----- Cholesterol good, liver function test good.  Continue present management

## 2021-04-04 NOTE — Telephone Encounter (Signed)
Spoke with patient regarding results and recommendation.  Patient verbalizes understanding and is agreeable to plan of care. Advised patient to call back with any issues or concerns.  

## 2021-06-12 ENCOUNTER — Telehealth: Payer: Self-pay | Admitting: Internal Medicine

## 2021-06-12 NOTE — Telephone Encounter (Signed)
Medication:  90 days of levothyroxine (SYNTHROID) 112 MCG tablet  Has the patient contacted their pharmacy? Yes.   (If no, request that the patient contact the pharmacy for the refill.) (If yes, when and what did the pharmacy advise?)  Preferred Pharmacy (with phone number or street name):   [facility] [address], [place], [place] [address] Phone: [phone]   Agent: Please be advised that RX refills may take up to 3 business days. We ask that you follow-up with your pharmacy.

## 2021-06-12 NOTE — Telephone Encounter (Signed)
Medication:  zolpidem (AMBIEN) 5 MG tablet  Has the patient contacted their pharmacy? No. (If no, request that the patient contact the pharmacy for the refill.) (If yes, when and what did the pharmacy advise?)  Preferred Pharmacy (with phone number or street name):  [facility], [place], [place] [address] Phone: [phone]  Agent: Please be advised that RX refills may take up to 3 business days. We ask that you follow-up with your pharmacy.

## 2021-06-13 ENCOUNTER — Telehealth: Payer: Self-pay | Admitting: Internal Medicine

## 2021-06-13 MED ORDER — ZOLPIDEM TARTRATE 5 MG PO TABS: 5.0000 mg | ORAL_TABLET | Freq: Every evening | ORAL | 0 refills | Status: DC | PRN

## 2021-06-13 MED ORDER — LEVOTHYROXINE SODIUM 112 MCG PO TABS: 112.0000 ug | ORAL_TABLET | Freq: Every day | ORAL | 0 refills | Status: DC

## 2021-06-13 NOTE — Telephone Encounter (Signed)
Due for CPX. Send him a note, need to schedule CPX before next refill. I sent only 30 tablets.

## 2021-06-13 NOTE — Telephone Encounter (Signed)
Pt called and was advised to make an appointment within the next 30 day per [facility]. There are currently no openings. Please advise

## 2021-06-13 NOTE — Telephone Encounter (Signed)
LVM to inform patient of message and schedule CPE.

## 2021-06-13 NOTE — Telephone Encounter (Signed)
Please inform Pt that a 30 day supply was sent- he is overdue for his cpe w/ PCP. Please schedule at his earliest convenience within the next 30 days. Thank you.

## 2021-06-13 NOTE — Telephone Encounter (Signed)
Okay to overbook

## 2021-06-13 NOTE — Telephone Encounter (Signed)
See other telephone note- Pt made aware he is overdue for cpx.

## 2021-06-13 NOTE — Telephone Encounter (Signed)
Requesting: Ambien 5mg  Contract: None UDS: N/a Last Visit: 11/28/2020 Next Visit: None Last Refill: 11/28/2020 #90 and 1RF  Will need contract at next ov.   Please Advise

## 2021-06-15 ENCOUNTER — Telehealth: Payer: Self-pay | Admitting: Internal Medicine

## 2021-06-15 ENCOUNTER — Telehealth: Payer: Self-pay

## 2021-06-15 MED ORDER — ZOLPIDEM TARTRATE 5 MG PO TABS: 5.0000 mg | ORAL_TABLET | Freq: Every evening | ORAL | 0 refills | Status: DC | PRN

## 2021-06-15 NOTE — Telephone Encounter (Signed)
PDMP reviewed, it is a little early but I sent the prescription to [facility].

## 2021-06-15 NOTE — Telephone Encounter (Signed)
Can you resend please? Please note multiple pharmacies saved under preferred pharmacy. Will place him under contract at next visit.

## 2021-06-15 NOTE — Telephone Encounter (Signed)
Nurse Assessment Nurse: Solocinski, RN, Beth Date/Time (Eastern Time): 06/14/2021 6:42:18 PM Confirm and document reason for call. If symptomatic, describe symptoms. ---Caller states he needs refill of Ambien and will be going out of town this week. Caller spoke to office for the refill but when it was sent to pharmacy it did not have date so he can pick up rx early since it is controlled. Caller is very upset and requested office administrator name, gave name but not contact information. Caller states he would speak with administrator in am. Does the patient have any new or worsening symptoms? ---No Please document clinical information provided and list any resource used. ---Informed caller medications are not processed during after hours and it is also controlled and would have to be addressed during business hours. Per directives and nursing judgment. Disp. Time Eilene Ghazi Time) Disposition Final User 06/14/2021 6:49:38 PM Clinical Call Yes Solocinski, RN, [employer]

## 2021-06-15 NOTE — Telephone Encounter (Signed)
Pt. Would like the refill request sent here instead so he can pick up this with the other medication that was filled at this pharmacy.   Medication: zolpidem (AMBIEN) 5 MG tablet   Has the patient contacted their pharmacy? Yes.   (If no, request that the patient contact the pharmacy for the refill.) (If yes, when and what did the pharmacy advise?)  Preferred Pharmacy (with phone number or street name): [facility] [address] [place], [place] - [address]  [address], [place] [address]  Phone:  [phone]  Fax:  [phone]   Agent: Please be advised that RX refills may take up to 3 business days. We ask that you follow-up with your pharmacy.

## 2021-06-28 ENCOUNTER — Ambulatory Visit (INDEPENDENT_AMBULATORY_CARE_PROVIDER_SITE_OTHER): Payer: 59 | Admitting: Internal Medicine

## 2021-06-28 ENCOUNTER — Encounter: Payer: Self-pay | Admitting: Internal Medicine

## 2021-06-28 VITALS — BP 132/82 | HR 84 | Temp 98.2°F | Resp 16 | Ht 75.0 in | Wt 176.1 lb

## 2021-06-28 DIAGNOSIS — E782 Mixed hyperlipidemia: Secondary | ICD-10-CM

## 2021-06-28 DIAGNOSIS — Z0001 Encounter for general adult medical examination with abnormal findings: Secondary | ICD-10-CM

## 2021-06-28 DIAGNOSIS — J019 Acute sinusitis, unspecified: Secondary | ICD-10-CM

## 2021-06-28 DIAGNOSIS — R351 Nocturia: Secondary | ICD-10-CM

## 2021-06-28 DIAGNOSIS — E039 Hypothyroidism, unspecified: Secondary | ICD-10-CM

## 2021-06-28 DIAGNOSIS — F5104 Psychophysiologic insomnia: Secondary | ICD-10-CM

## 2021-06-28 DIAGNOSIS — Z0000 Encounter for general adult medical examination without abnormal findings: Secondary | ICD-10-CM

## 2021-06-28 DIAGNOSIS — E038 Other specified hypothyroidism: Secondary | ICD-10-CM

## 2021-06-28 DIAGNOSIS — R399 Unspecified symptoms and signs involving the genitourinary system: Secondary | ICD-10-CM

## 2021-06-28 DIAGNOSIS — I10 Essential (primary) hypertension: Secondary | ICD-10-CM

## 2021-06-28 MED ORDER — AMOXICILLIN-POT CLAVULANATE 875-125 MG PO TABS: 1.0000 | ORAL_TABLET | Freq: Two times a day (BID) | ORAL | 0 refills | Status: DC

## 2021-06-28 MED ORDER — LEVOTHYROXINE SODIUM 112 MCG PO TABS: 112.0000 ug | ORAL_TABLET | Freq: Every day | ORAL | 0 refills | Status: DC

## 2021-06-28 MED ORDER — ZOLPIDEM TARTRATE 5 MG PO TABS: 5.0000 mg | ORAL_TABLET | Freq: Every evening | ORAL | 0 refills | Status: DC | PRN

## 2021-06-28 NOTE — Assessment & Plan Note (Signed)
Td 2014 PNM 20: Recommended, declined for now S/p Shingrix Covid booster rec Had a flu shot  CCS: Had a colonoscopy 2017, 5 years, was referred to GI few months ago, plans to pursue at some point but currently having some insurance issues. Prostate cancer screening: DRE completely normal today, checking a PSA, UA, urine culture. Labs:  BMP, CBC, TSH, T4, PSA, UA, urine culture Advance care planning package provided Diet exercise: Doing great since May 2022 when he started doing "cardiac rehab program" in [place].

## 2021-06-28 NOTE — Assessment & Plan Note (Signed)
Here for CPX Sinusitis, L OMA: Augmentin x7 days, Flonase, Astepro HTN: BP today is normal, on no BP meds. High cholesterol: Last FLP satisfactory, continue Crestor Insomnia: On Ambien, contract today.  Rx provided.  Contract signed. Thyroid disease: Checking labs, RF provided CAD, LBBB: Currently asymptomatic. Thrombocytopenia: Chronic issue, see last entry.  Checking a CBC. LUTS: Complains of nocturia since he changed his diet in May.  DRE normal, check another PSA, UA urine culture.  Offered tamsulosin, declined. RTC 6 months

## 2021-06-28 NOTE — Progress Notes (Signed)
Subjective:    Patient ID: Peter Spencer, male    DOB: 1961/01/13, 60 y.o.   MRN: [id number]  DOS:  06/28/2021 Type of visit - description: cpx Here for CPX In addition we discussed all her issues:  Had a URI/sinus infection last week, still has a lot of sinus congestion and pressure on the ears, worse on the left.  Also, he changed his diet significantly since around May 2022, since then is drinking much more water and he has noted nocturia. It is very disturbing to him, interrupt his sleep.  Denies dysuria, gross hematuria, difficulty urinating.  Review of Systems  Other than above, a 14 point review of systems is negative     Past Medical History:  Diagnosis Date   Acquired hypothyroidism 11/18/2014   Annual physical exam 03/01/2020   Anxiety    Benign essential hypertension 11/18/2014   CAD (coronary artery disease)    CAD in native artery 10/23/2018   Chronic insomnia 11/18/2014   Dupuytren's contracture of left hand 11/25/2019   GERD (gastroesophageal reflux disease)    GERD without esophagitis 10/23/2018   Hyperlipidemia    Hypertension    no meds now   Hypertensive urgency 09/28/2013   Hypothyroidism    LBBB (left bundle branch block) 09/29/2013   Left bundle branch block    OSA (obstructive sleep apnea)    PCP NOTES >>>>>>>>>>>>>> 03/02/2020   Retinal detachment 06/2018   Right   Retinal detachment, right 11/25/2019   Seasonal allergies    Thrombocytopenia (HCC)--history of, reports extensive w/u (early 2000, ITP?) 11/29/2020   Thyroid disease     Past Surgical History:  Procedure Laterality Date   BASAL CELL CARCINOMA EXCISION N/A 10/30/2020   Procedure: Excision of scalp squamous cell carcinoma with frozen section, application of integra;  Surgeon: Cindra Presume, MD;  Location: [facility];  Service: Plastics;  Laterality: N/A;  1 hour, please   DUPUYTREN CONTRACTURE RELEASE Left 05/2016   FASCIECTOMY Left 07/11/2016   Procedure: FASCIECTOMY left  index possible joint release;  Surgeon: Daryll Brod, MD;  Location: [facility];  Service: Orthopedics;  Laterality: Left;  FASCIECTOMY left index possible joint release   KNEE SURGERY     Left eye retna repaired      MOHS SURGERY  2017   NASAL FRACTURE SURGERY     RETINAL DETACHMENT SURGERY Right 06/2018   TOE SURGERY     TONSILLECTOMY     Social History   Socioeconomic History   Marital status: Married    Spouse name: Not on file   Number of children: 2   Years of education: Not on file   Highest education level: Not on file  Occupational History   Occupation: self employed    Occupation: Freight forwarder, [employer]  Tobacco Use   Smoking status: Never   Smokeless tobacco: Never  Vaping Use   Vaping Use: Never used  Substance and Sexual Activity   Alcohol use: Yes    Comment: one glass of wine 4 nights a week   Drug use: No   Sexual activity: Not on file  Other Topics Concern   Not on file  Social History Narrative   Household: pt and wife   Social Determinants of Health   Financial Resource Strain: Not on file  Food Insecurity: Not on file  Transportation Needs: Not on file  Physical Activity: Not on file  Stress: Not on file  Social Connections: Not on file  Intimate Partner Violence: Not on file     Allergies as of 06/28/2021       Reactions   Dust Mite Extract    Mold Extract [trichophyton]    Tetracyclines & Related Other (See Comments)   Caused liver enzymes to go up   Sulfa Antibiotics Nausea And Vomiting, Nausea Only        Medication List        Accurate as of June 28, 2021  6:08 PM. If you have any questions, ask your nurse or doctor.          amoxicillin-clavulanate 875-125 MG tablet Commonly known as: Augmentin Take 1 tablet by mouth 2 (two) times daily. Started by: Kathlene November, MD   aspirin EC 81 MG tablet Take 81 mg by mouth daily.   azelastine 0.1 % nasal spray Commonly known as: ASTELIN Place 2 sprays into both  nostrils at bedtime as needed for rhinitis or allergies. Use in each nostril as directed   doxycycline 100 MG capsule Commonly known as: VIBRAMYCIN Take 50 mg by mouth daily.   fexofenadine 180 MG tablet Commonly known as: ALLEGRA Take 180 mg by mouth at bedtime.   fluticasone 50 MCG/ACT nasal spray Commonly known as: FLONASE Place 1 spray into both nostrils as needed for allergies or rhinitis.   hydrocortisone 2.5 % lotion Apply 1 application topically as needed (rocesea).   levothyroxine 112 MCG tablet Commonly known as: SYNTHROID Take 1 tablet (112 mcg total) by mouth daily before breakfast.   magnesium oxide 400 MG tablet Commonly known as: MAG-OX Take 400 mg by mouth daily.   metroNIDAZOLE 0.75 % gel Commonly known as: METROGEL Apply 1 application topically as needed (rash).   multivitamin with minerals Tabs tablet Take 1 tablet by mouth daily. Unknown stregnth   rosuvastatin 40 MG tablet Commonly known as: CRESTOR Take 1 tablet (40 mg total) by mouth daily.   Vitamin D 125 MCG (5000 UT) Caps Take 5,000 Units by mouth daily.   zolpidem 5 MG tablet Commonly known as: AMBIEN Take 1 tablet (5 mg total) by mouth at bedtime as needed for sleep.           Objective:   Physical Exam BP 132/82 (BP Location: Left Arm, Patient Position: Sitting, Cuff Size: Normal)   Pulse 84   Temp 98.2 F (36.8 C) (Oral)   Resp 16   Ht 6\' 3"  (1.905 m)   Wt 176 lb 2 oz (79.9 kg)   SpO2 98%   BMI 22.01 kg/m  General: Well developed, NAD, BMI noted Neck: No  thyromegaly  HEENT:  Normocephalic . Face symmetric, atraumatic Sinuses: TTP bilaterally TMs: Both bulge, L TM is red. Lungs:  CTA B Normal respiratory effort, no intercostal retractions, no accessory muscle use. Heart: RRR,  no murmur.  Abdomen:  Not distended, soft, non-tender. No rebound or rigidity.   Lower extremities: no pretibial edema bilaterally DRE: Normal sphincter tone, no stools, prostate normal and  symmetric.  Nontender. Skin: Exposed areas without rash. Not pale. Not jaundice Neurologic:  alert & oriented X3.  Speech normal, gait appropriate for age and unassisted Strength symmetric and appropriate for age.  Psych: Cognition and judgment appear intact.  Cooperative with normal attention span and concentration.  Behavior appropriate. No anxious or depressed appearing.     Assessment     ASSESSMENT (new patient 11/2019) HTN, history of hypertensive urgency High cholesterol Thyroid disease CAD, LBBB (+ LAD disease per coronary CT, neg a stress  test 2017) OSA - borderline per pt  Dupuytren's Right retinal detachment 2019 [place] Rosacea dx ~ 2017, seen @ [place] [facility], increased LFTs w/ tetracycline per chart review Retinal detachment , surgery R 22019, L 05-2020 Thrombocytopenia: w/u in [facility] ~ 1990s, early 2000s, including a BM Bx , ITP ?  PLAN Here for CPX Sinusitis, L OMA: Augmentin x7 days, Flonase, Astepro HTN: BP today is normal, on no BP meds. High cholesterol: Last FLP satisfactory, continue Crestor Insomnia: On Ambien, contract today.  Rx provided.  Contract signed. Thyroid disease: Checking labs, RF provided CAD, LBBB: Currently asymptomatic. Thrombocytopenia: Chronic issue, see last entry.  Checking a CBC. LUTS: Complains of nocturia since he changed his diet in May.  DRE normal, check another PSA, UA urine culture.  Offered tamsulosin, declined. RTC 6 months   This visit occurred during the SARS-CoV-2 public health emergency.  Safety protocols were in place, including screening questions prior to the visit, additional usage of staff PPE, and extensive cleaning of exam room while observing appropriate contact time as indicated for disinfecting solutions.

## 2021-06-28 NOTE — Patient Instructions (Signed)
Vaccines are recommended: Pneumonia shot (PNM 20) COVID booster  Sinusitis, ear infection: - Augmentin x1 week -Use OTC  Flonase or Nasacort (generics are okay and less expensive): 2 nasal sprays on each side of the nose in the morning until you feel better -Use OTC Astepro 2 nasal sprays on each side of the nose twice daily until better Avoid decongestants such as  Pseudoephedrine or phenylephrine   Call if not gradually better over the next  10 days    GO TO THE LAB : Get the blood work     GO TO THE FRONT DESK, [place] back for   for a checkup in 6 months

## 2021-07-05 ENCOUNTER — Other Ambulatory Visit (INDEPENDENT_AMBULATORY_CARE_PROVIDER_SITE_OTHER): Payer: 59

## 2021-07-05 DIAGNOSIS — R399 Unspecified symptoms and signs involving the genitourinary system: Secondary | ICD-10-CM

## 2021-07-05 DIAGNOSIS — E038 Other specified hypothyroidism: Secondary | ICD-10-CM | POA: Diagnosis not present

## 2021-07-05 DIAGNOSIS — I10 Essential (primary) hypertension: Secondary | ICD-10-CM | POA: Diagnosis not present

## 2021-07-05 LAB — CBC WITH DIFFERENTIAL/PLATELET
Basophils Absolute: 0 10*3/uL (ref 0.0–0.1)
Basophils Relative: 0.3 % (ref 0.0–3.0)
Eosinophils Absolute: 0.1 10*3/uL (ref 0.0–0.7)
Eosinophils Relative: 2.7 % (ref 0.0–5.0)
HCT: 40.6 % (ref 39.0–52.0)
Hemoglobin: 13.7 g/dL (ref 13.0–17.0)
Lymphocytes Relative: 34.9 % (ref 12.0–46.0)
Lymphs Abs: 1.2 10*3/uL (ref 0.7–4.0)
MCHC: 33.8 g/dL (ref 30.0–36.0)
MCV: 94.9 fl (ref 78.0–100.0)
Monocytes Absolute: 0.4 10*3/uL (ref 0.1–1.0)
Monocytes Relative: 11.9 % (ref 3.0–12.0)
Neutro Abs: 1.8 10*3/uL (ref 1.4–7.7)
Neutrophils Relative %: 50.2 % (ref 43.0–77.0)
Platelets: 114 10*3/uL — ABNORMAL LOW (ref 150.0–400.0)
RBC: 4.28 Mil/uL (ref 4.22–5.81)
RDW: 13.1 % (ref 11.5–15.5)
WBC: 3.5 10*3/uL — ABNORMAL LOW (ref 4.0–10.5)

## 2021-07-05 LAB — PSA: PSA: 0.6 ng/mL (ref 0.10–4.00)

## 2021-07-05 LAB — BASIC METABOLIC PANEL
BUN: 16 mg/dL (ref 6–23)
CO2: 30 mEq/L (ref 19–32)
Calcium: 8.9 mg/dL (ref 8.4–10.5)
Chloride: 102 mEq/L (ref 96–112)
Creatinine, Ser: 0.86 mg/dL (ref 0.40–1.50)
GFR: 92.57 mL/min (ref >60.00)
Glucose, Bld: 91 mg/dL (ref 70–99)
Potassium: 4.8 mEq/L (ref 3.5–5.1)
Sodium: 137 mEq/L (ref 135–145)

## 2021-07-05 LAB — URINALYSIS, ROUTINE W REFLEX MICROSCOPIC
Bilirubin Urine: NEGATIVE
Hgb urine dipstick: NEGATIVE
Ketones, ur: NEGATIVE
Leukocytes,Ua: NEGATIVE
Nitrite: NEGATIVE
RBC / HPF: NONE SEEN (ref 0–?)
Specific Gravity, Urine: 1.01 (ref 1.000–1.030)
Total Protein, Urine: NEGATIVE
Urine Glucose: NEGATIVE
Urobilinogen, UA: 0.2 (ref 0.0–1.0)
pH: 7.5 (ref 5.0–8.0)

## 2021-07-05 LAB — TSH: TSH: 1.95 u[IU]/mL (ref 0.35–5.50)

## 2021-07-05 NOTE — Addendum Note (Signed)
Addended by: Manuela Schwartz on: 07/05/2021 08:17 AM   Modules accepted: Orders

## 2021-07-06 LAB — URINE CULTURE
MICRO NUMBER:: 12731789
Result:: NO GROWTH
SPECIMEN QUALITY:: ADEQUATE

## 2021-07-09 ENCOUNTER — Telehealth: Payer: Self-pay | Admitting: Internal Medicine

## 2021-07-09 NOTE — Telephone Encounter (Signed)
Ear infection is not gone and ears still in pain. He completed the round of antibiotics but still no better. He said if another round of antibiotics is needed is he okay with that... he would just like to avoid augmentin because it upset his stomach.

## 2021-07-09 NOTE — Telephone Encounter (Signed)
Please advise 

## 2021-07-10 MED ORDER — PREDNISONE 10 MG PO TABS: ORAL_TABLET | ORAL | 0 refills | Status: DC

## 2021-07-10 MED ORDER — DOXYCYCLINE HYCLATE 100 MG PO TABS: 100.0000 mg | ORAL_TABLET | Freq: Two times a day (BID) | ORAL | 0 refills | Status: DC

## 2021-07-10 NOTE — Telephone Encounter (Signed)
Spoke w/ Pt- informed of recommendations. Pt verbalized understanding. Doxycycline sent to [facility]

## 2021-07-10 NOTE — Telephone Encounter (Signed)
He was diagnosed with L OMA, recommend a round of prednisone, also send doxycycline, 100 mg twice daily #14 no refills.

## 2021-08-09 DIAGNOSIS — G4719 Other hypersomnia: Secondary | ICD-10-CM | POA: Diagnosis not present

## 2021-08-23 DIAGNOSIS — G4733 Obstructive sleep apnea (adult) (pediatric): Secondary | ICD-10-CM | POA: Diagnosis not present

## 2021-08-24 DIAGNOSIS — J0100 Acute maxillary sinusitis, unspecified: Secondary | ICD-10-CM | POA: Diagnosis not present

## 2021-08-30 DIAGNOSIS — J0191 Acute recurrent sinusitis, unspecified: Secondary | ICD-10-CM | POA: Insufficient documentation

## 2021-08-30 DIAGNOSIS — G44219 Episodic tension-type headache, not intractable: Secondary | ICD-10-CM | POA: Insufficient documentation

## 2021-08-30 DIAGNOSIS — H6983 Other specified disorders of Eustachian tube, bilateral: Secondary | ICD-10-CM | POA: Diagnosis not present

## 2021-08-30 DIAGNOSIS — J0100 Acute maxillary sinusitis, unspecified: Secondary | ICD-10-CM | POA: Diagnosis not present

## 2021-08-31 DIAGNOSIS — H6983 Other specified disorders of Eustachian tube, bilateral: Secondary | ICD-10-CM | POA: Insufficient documentation

## 2021-09-25 DIAGNOSIS — G4733 Obstructive sleep apnea (adult) (pediatric): Secondary | ICD-10-CM | POA: Diagnosis not present

## 2021-10-07 ENCOUNTER — Other Ambulatory Visit: Payer: Self-pay | Admitting: Internal Medicine

## 2021-10-08 DIAGNOSIS — H35373 Puckering of macula, bilateral: Secondary | ICD-10-CM | POA: Diagnosis not present

## 2021-10-08 DIAGNOSIS — H59813 Chorioretinal scars after surgery for detachment, bilateral: Secondary | ICD-10-CM | POA: Diagnosis not present

## 2021-10-19 ENCOUNTER — Encounter: Payer: Self-pay | Admitting: Cardiology

## 2021-10-19 ENCOUNTER — Other Ambulatory Visit: Payer: Self-pay

## 2021-10-19 ENCOUNTER — Ambulatory Visit: Payer: BC Managed Care – PPO | Admitting: Cardiology

## 2021-10-19 VITALS — BP 136/90 | HR 68 | Ht 75.0 in | Wt 174.0 lb

## 2021-10-19 DIAGNOSIS — E782 Mixed hyperlipidemia: Secondary | ICD-10-CM | POA: Diagnosis not present

## 2021-10-19 DIAGNOSIS — I447 Left bundle-branch block, unspecified: Secondary | ICD-10-CM

## 2021-10-19 DIAGNOSIS — I2510 Atherosclerotic heart disease of native coronary artery without angina pectoris: Secondary | ICD-10-CM

## 2021-10-19 DIAGNOSIS — I10 Essential (primary) hypertension: Secondary | ICD-10-CM

## 2021-10-19 NOTE — Patient Instructions (Signed)
Medication Instructions:  ?Your physician recommends that you continue on your current medications as directed. Please refer to the Current Medication list given to you today. ? ?*If you need a refill on your cardiac medications before your next appointment, please call your pharmacy* ? ? ?Lab Work: ?Your physician recommends that you return for lab work in:  ? ?Labs today: lipid, testosterone ? ?If you have labs (blood work) drawn today and your tests are completely normal, you will receive your results only by: ?MyChart Message (if you have MyChart) OR ?A paper copy in the mail ?If you have any lab test that is abnormal or we need to change your treatment, we will call you to review the results. ? ? ?Testing/Procedures: ?Your physician has requested that you have an echocardiogram. Echocardiography is a painless test that uses sound waves to create images of your heart. It provides your doctor with information about the size and shape of your heart and how well your heart?s chambers and valves are working. This procedure takes approximately one hour. There are no restrictions for this procedure. ? ? ? ?Follow-Up: ?At [facility], you and your health needs are our priority.  As part of our continuing mission to provide you with exceptional heart care, we have created designated Provider Care Teams.  These Care Teams include your primary Cardiologist (physician) and Advanced Practice Providers (APPs -  Physician Assistants and Nurse Practitioners) who all work together to provide you with the care you need, when you need it. ? ?We recommend signing up for the patient portal called "MyChart".  Sign up information is provided on this After Visit Summary.  MyChart is used to connect with patients for Virtual Visits (Telemedicine).  Patients are able to view lab/test results, encounter notes, upcoming appointments, etc.  Non-urgent messages can be sent to your provider as well.   ?To learn more about what you can do  with MyChart, go to [email or web address].   ? ?Your next appointment:   ?1 year(s) ? ?The format for your next appointment:   ?In Person ? ?Provider:   ?Jenne Campus, MD  ? ? ?Other Instructions ?None ? ?

## 2021-10-19 NOTE — Progress Notes (Signed)
?Cardiology Office Note:   ? ?Date:  10/19/2021  ? ?ID:  Peter Spencer, DOB 1960/09/11, MRN [id number] ? ?PCP:  Colon Branch, MD  ?Cardiologist:  Jenne Campus, MD   ? ?Referring MD: Colon Branch, MD  ? ?No chief complaint on file. ?Doing very well ? ?History of Present Illness:   ? ?Peter Spencer is a 61 y.o. male with past medical history significant for coronary artery disease, few years ago he had coronary CT angio which showed moderate stenosis of LAD.  In 2017 he end up having stress test which was negative.  He does have baseline left bundle branch block, also essential hypertension, dyslipidemia.  He is ejection fraction is 40 to 45% which I will dissipate in somebody with left bundle branch block.  He comes today after 1 year follow-up.  He is doing very well.  He participate in program that manage his stress exercises as well as diet which was basically low fat low cholesterol diet.  He is doing quite well with this.  Feeling well denies have any chest pain tightness squeezing pressure burning chest no shortness of breath.  Described to have some joint aches which he attributed to statin. ? ?Past Medical History:  ?Diagnosis Date  ? Acquired hypothyroidism 11/18/2014  ? Annual physical exam 03/01/2020  ? Anxiety   ? Benign essential hypertension 11/18/2014  ? CAD (coronary artery disease)   ? CAD in native artery 10/23/2018  ? Chronic insomnia 11/18/2014  ? Dupuytren's contracture of left hand 11/25/2019  ? GERD (gastroesophageal reflux disease)   ? GERD without esophagitis 10/23/2018  ? Hyperlipidemia   ? Hypertension   ? no meds now  ? Hypertensive urgency 09/28/2013  ? Hypothyroidism   ? LBBB (left bundle branch block) 09/29/2013  ? Left bundle branch block   ? OSA (obstructive sleep apnea)   ? PCP NOTES >>>>>>>>>>>>>> 03/02/2020  ? Retinal detachment 06/2018  ? Right  ? Retinal detachment, right 11/25/2019  ? Seasonal allergies   ? Thrombocytopenia (HCC)--history of, reports extensive w/u (early 2000,  ITP?) 11/29/2020  ? Thyroid disease   ? ? ?Past Surgical History:  ?Procedure Laterality Date  ? BASAL CELL CARCINOMA EXCISION N/A 10/30/2020  ? Procedure: Excision of scalp squamous cell carcinoma with frozen section, application of integra;  Surgeon: Cindra Presume, MD;  Location: [facility];  Service: Plastics;  Laterality: N/A;  1 hour, please  ? DUPUYTREN CONTRACTURE RELEASE Left 05/2016  ? FASCIECTOMY Left 07/11/2016  ? Procedure: FASCIECTOMY left index possible joint release;  Surgeon: Daryll Brod, MD;  Location: [facility];  Service: Orthopedics;  Laterality: Left;  FASCIECTOMY left index possible joint release  ? KNEE SURGERY    ? Left eye retna repaired     ? MOHS SURGERY  2017  ? NASAL FRACTURE SURGERY    ? RETINAL DETACHMENT SURGERY Right 06/2018  ? TOE SURGERY    ? TONSILLECTOMY    ? ? ?Current Medications: ?Current Meds  ?Medication Sig  ? aspirin EC 81 MG tablet Take 81 mg by mouth daily.  ? azelastine (ASTELIN) 0.1 % nasal spray Place 2 sprays into both nostrils at bedtime as needed for rhinitis or allergies. Use in each nostril as directed  ? Cholecalciferol (VITAMIN D) 125 MCG (5000 UT) CAPS Take 5,000 Units by mouth daily.  ? fexofenadine (ALLEGRA) 180 MG tablet Take 180 mg by mouth at bedtime.  ? fluticasone (FLONASE) 50 MCG/ACT nasal spray Place  1 spray into both nostrils as needed for allergies or rhinitis.  ? hydrocortisone 2.5 % lotion Apply 1 application topically as needed (rocesea).  ? levothyroxine (SYNTHROID) 112 MCG tablet Take 1 tablet (112 mcg total) by mouth daily before breakfast.  ? magnesium oxide (MAG-OX) 400 MG tablet Take 400 mg by mouth daily.  ? metroNIDAZOLE (METROGEL) 0.75 % gel Apply 1 application. topically as needed (rash).  ? Multiple Vitamin (MULTIVITAMIN WITH MINERALS) TABS tablet Take 1 tablet by mouth daily. Unknown stregnth  ? rosuvastatin (CRESTOR) 40 MG tablet Take 1 tablet (40 mg total) by mouth daily.  ? zolpidem (AMBIEN) 5 MG tablet  Take 1 tablet (5 mg total) by mouth at bedtime as needed for sleep.  ?  ? ?Allergies:   Dust mite extract, Mold extract [trichophyton], Tetracyclines & related, and Sulfa antibiotics  ? ?Social History  ? ?Socioeconomic History  ? Marital status: Married  ?  Spouse name: Not on file  ? Number of children: 2  ? Years of education: Not on file  ? Highest education level: Not on file  ?Occupational History  ? Occupation: self employed   ? 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 Smoking status: Never  ? Smokeless tobacco: Never  ?Vaping Use  ? Vaping Use: Never used  ?Substance and Sexual Activity  ? Alcohol use: Yes  ?  Comment: one glass of wine 4 nights a week  ? Drug use: No  ? Sexual activity: Not on file  ?Other Topics Concern  ? Not on file  ?Social History Narrative  ? Household: pt and wife  ? ?Social Determinants of Health  ? ?Financial Resource Strain: Not on file  ?Food Insecurity: Not on file  ?Transportation Needs: Not on file  ?Physical Activity: Not on file  ?Stress: Not on file  ?Social Connections: Not on file  ?  ? ?Family History: ?The patient's family history includes Atrial fibrillation in his father; CAD in an other family member; Dementia in his father; Pancreatic cancer in his mother; Parkinsonism in his father. There is no history of Stroke, Diabetes Mellitus II, Colon cancer, or Prostate cancer. ?ROS:   ?Please see the history of present illness.    ?All 14 point review of systems negative except as described per history of present illness ? ?EKGs/Labs/Other Studies Reviewed:   ? ? ? ?Recent Labs: ?03/30/2021: ALT 24 ?07/05/2021: BUN 16; Creatinine, Ser 0.86; Hemoglobin 13.7; Platelets 114.0; Potassium 4.8; Sodium 137; TSH 1.95  ?Recent Lipid Panel ?   ?Component Value Date/Time  ? CHOL 121 03/30/2021 0837  ? TRIG 78 03/30/2021 0837  ? HDL 44 03/30/2021 0837  ? CHOLHDL 2.8 03/30/2021 0837  ? CHOLHDL 3 03/01/2020 1144  ? VLDL 16.8 03/01/2020 1144  ? [place]iley 61 03/30/2021 0837   ? ? ?Physical Exam:   ? ?VS:  BP 136/90 (BP Location: Right Arm, Patient Position: Sitting)   Pulse 68   Ht '6\' 3"'$  (1.905 m)   Wt 174 lb (78.9 kg)   SpO2 98%   BMI 21.75 kg/m?    ? ?Wt Readings from Last 3 Encounters:  ?10/19/21 174 lb (78.9 kg)  ?06/28/21 176 lb 2 oz (79.9 kg)  ?12/05/20 188 lb (85.3 kg)  ?  ? ?GEN:  Well nourished, well developed in no acute distress ?HEENT: Normal ?NECK: No JVD; No carotid bruits ?LYMPHATICS: No lymphadenopathy ?CARDIAC: RRR, no murmurs, no rubs, no gallops ?RESPIRATORY:  Clear to auscultation without rales, wheezing or rhonchi  ?  ABDOMEN: Soft, non-tender, non-distended ?MUSCULOSKELETAL:  No edema; No deformity  ?SKIN: Warm and dry ?LOWER EXTREMITIES: no swelling ?NEUROLOGIC:  Alert and oriented x 3 ?PSYCHIATRIC:  Normal affect  ? ?ASSESSMENT:   ? ?1. LBBB (left bundle branch block)   ?2. Benign essential hypertension   ?3. CAD in native artery   ?4. Mixed hyperlipidemia   ? ?PLAN:   ? ?In order of problems listed above: ? ?Coronary artery disease.  Asymptomatic.  He is on antiplatelet therapy which I will continue.  I will ask him to have echocardiogram repeated because of previously ejection fraction 40 to 45% related to left bundle branch block.  We spent a great deal of time talking to him as well as to his wife who was on the phone talk about the good diet and eating habits.  I strongly recommended Mediterranean diet, we discussed basic of his diet.  He is thinking about switching to it. ?Essential hypertension, mildly on the higher side but he said at home it is always good.  We will continue present management we will check echocardiogram for ejection fraction today he may benefit from small dose of ARB. ?Dyslipidemia I did review his lab work test done by primary care physician total cholesterol 121 HDL 44 LDL of 61 this is on Crestor 40 which I will continue.  He complained of having some muscle aches and thinks this is related to Crestor ask him to stop Crestor for  1 week to see if he is felt better better if he does then we will switch him to a different statin if that will not work may be nonstatin approach to reduction of cholesterol be needed. ? ?Medication Adjustments/Labs and T

## 2021-10-19 NOTE — Addendum Note (Signed)
Addended by: Edwyna Shell I on: 10/19/2021 09:23 AM ? ? Modules accepted: Orders ? ?

## 2021-10-20 LAB — LIPID PANEL
Chol/HDL Ratio: 2.4 ratio (ref 0.0–5.0)
Cholesterol, Total: 135 mg/dL (ref 100–199)
HDL: 57 mg/dL (ref >39)
LDL Chol Calc (NIH): 66 mg/dL (ref 0–99)
Triglycerides: 53 mg/dL (ref 0–149)
VLDL Cholesterol Cal: 12 mg/dL (ref 5–40)

## 2021-10-20 LAB — TESTOSTERONE: Testosterone: 508 ng/dL (ref 264–916)

## 2021-10-29 ENCOUNTER — Encounter: Payer: Self-pay | Admitting: Cardiology

## 2021-10-29 ENCOUNTER — Encounter: Payer: Self-pay | Admitting: Internal Medicine

## 2021-10-29 DIAGNOSIS — G4733 Obstructive sleep apnea (adult) (pediatric): Secondary | ICD-10-CM | POA: Diagnosis not present

## 2021-10-30 ENCOUNTER — Other Ambulatory Visit: Payer: Self-pay | Admitting: Internal Medicine

## 2021-10-30 MED ORDER — ZOLPIDEM TARTRATE ER 6.25 MG PO TBCR: 6.2500 mg | EXTENDED_RELEASE_TABLET | Freq: Every evening | ORAL | 0 refills | Status: DC | PRN

## 2021-10-31 ENCOUNTER — Telehealth: Payer: Self-pay

## 2021-10-31 DIAGNOSIS — E782 Mixed hyperlipidemia: Secondary | ICD-10-CM

## 2021-10-31 DIAGNOSIS — I10 Essential (primary) hypertension: Secondary | ICD-10-CM

## 2021-10-31 NOTE — Telephone Encounter (Signed)
Lab request for Lipid, AST, ALT sent to [facility] for labs in 6 weeks ?

## 2021-11-06 ENCOUNTER — Other Ambulatory Visit: Payer: Self-pay

## 2021-11-06 DIAGNOSIS — E782 Mixed hyperlipidemia: Secondary | ICD-10-CM

## 2021-11-06 MED ORDER — PRAVASTATIN SODIUM 40 MG PO TABS
40.0000 mg | ORAL_TABLET | Freq: Every evening | ORAL | 3 refills | Status: DC
Start: 2021-11-06 — End: 2022-10-08

## 2021-11-08 ENCOUNTER — Ambulatory Visit (HOSPITAL_BASED_OUTPATIENT_CLINIC_OR_DEPARTMENT_OTHER)
Admission: RE | Admit: 2021-11-08 | Discharge: 2021-11-08 | Disposition: A | Discharge status: Home / Self Care | Payer: BC Managed Care – PPO | Source: Ambulatory Visit | Attending: Cardiology | Admitting: Cardiology

## 2021-11-08 DIAGNOSIS — I10 Essential (primary) hypertension: Secondary | ICD-10-CM | POA: Insufficient documentation

## 2021-11-08 DIAGNOSIS — I447 Left bundle-branch block, unspecified: Secondary | ICD-10-CM | POA: Diagnosis not present

## 2021-11-08 DIAGNOSIS — E782 Mixed hyperlipidemia: Secondary | ICD-10-CM | POA: Insufficient documentation

## 2021-11-08 DIAGNOSIS — R0609 Other forms of dyspnea: Secondary | ICD-10-CM

## 2021-11-08 DIAGNOSIS — I2510 Atherosclerotic heart disease of native coronary artery without angina pectoris: Secondary | ICD-10-CM | POA: Diagnosis not present

## 2021-11-08 LAB — ECHOCARDIOGRAM COMPLETE
AR max vel: 2.92 cm2
AV Area VTI: 2.92 cm2
AV Area mean vel: 2.68 cm2
AV Mean grad: 3 mmHg
AV Peak grad: 4.8 mmHg
Ao pk vel: 1.1 m/s
Calc EF: 45.4 %
S' Lateral: 3.3 cm
Single Plane A2C EF: 47.5 %
Single Plane A4C EF: 41.3 %

## 2021-11-08 NOTE — Progress Notes (Signed)
?  Echocardiogram ?2D Echocardiogram has been performed. ? ?Peter Spencer ?11/08/2021, 3:18 PM ?

## 2021-11-08 NOTE — Progress Notes (Signed)
?  Echocardiogram ?2D Echocardiogram has been performed. ? ?Peter Spencer ?11/08/2021, 3:17 PM ?

## 2021-11-15 ENCOUNTER — Telehealth: Payer: Self-pay

## 2021-11-15 NOTE — Telephone Encounter (Signed)
Unable to reach the patient, results mailed to the patient  ?

## 2021-11-15 NOTE — Telephone Encounter (Signed)
-----   Message from Park Liter, MD sent at 11/09/2021  1:05 PM EDT ----- ?Echocardiogram showed lower limits of normal ejection fraction mild left ventricle hypertrophy, dilated left atrium moderate mitral valve regurgitation.  All this for medical therapy for now ?

## 2021-11-23 DIAGNOSIS — J019 Acute sinusitis, unspecified: Secondary | ICD-10-CM | POA: Diagnosis not present

## 2021-11-27 ENCOUNTER — Other Ambulatory Visit: Payer: Self-pay | Admitting: Internal Medicine

## 2021-11-28 NOTE — Telephone Encounter (Signed)
PDMP okay, unable to send prescription electronically, printed, will fax. ?

## 2021-11-28 NOTE — Telephone Encounter (Signed)
Requesting: Ambien CR 6.'25mg'$   ?Contract: 06/28/21 ?UDS: None ?Last Visit: 06/28/21 ?Next Visit: 12/27/21 ?Last Refill: 10/30/21 #30 and 0RF ? ?Please Advise ? ?

## 2021-11-28 NOTE — Telephone Encounter (Signed)
E-scribing down ,Rx faxed to [facility] on [address]. ?

## 2021-12-27 ENCOUNTER — Ambulatory Visit: Payer: BC Managed Care – PPO | Admitting: Internal Medicine

## 2021-12-27 ENCOUNTER — Telehealth: Payer: Self-pay | Admitting: Internal Medicine

## 2021-12-27 ENCOUNTER — Encounter: Payer: Self-pay | Admitting: Internal Medicine

## 2021-12-27 VITALS — BP 122/66 | HR 70 | Temp 97.8°F | Resp 16 | Ht 75.0 in | Wt 180.0 lb

## 2021-12-27 DIAGNOSIS — E782 Mixed hyperlipidemia: Secondary | ICD-10-CM | POA: Diagnosis not present

## 2021-12-27 DIAGNOSIS — I10 Essential (primary) hypertension: Secondary | ICD-10-CM | POA: Diagnosis not present

## 2021-12-27 DIAGNOSIS — E039 Hypothyroidism, unspecified: Secondary | ICD-10-CM | POA: Diagnosis not present

## 2021-12-27 LAB — TSH: TSH: 2.03 u[IU]/mL (ref 0.35–5.50)

## 2021-12-27 MED ORDER — ZOLPIDEM TARTRATE ER 6.25 MG PO TBCR
6.2500 mg | EXTENDED_RELEASE_TABLET | Freq: Every evening | ORAL | 1 refills | Status: DC | PRN
Start: 2021-12-27 — End: 2022-06-11

## 2021-12-27 NOTE — Telephone Encounter (Signed)
Requesting labs for 06/2022 to be placed, please advise.

## 2021-12-27 NOTE — Telephone Encounter (Signed)
See my chart message

## 2021-12-27 NOTE — Patient Instructions (Addendum)
Recommend to proceed with covid booster (bivalent) at your pharmacy.        GO TO THE LAB : Get the blood work     GO TO THE FRONT DESK, PLEASE SCHEDULE YOUR APPOINTMENTS Come back for   a  physical in 6 months

## 2021-12-27 NOTE — Telephone Encounter (Signed)
Pt would like labs done prior to his physical

## 2021-12-27 NOTE — Telephone Encounter (Signed)
Patient to call 2 weeks ahead of his physical exam

## 2021-12-27 NOTE — Progress Notes (Unsigned)
Subjective:    Patient ID: Peter Spencer, male    DOB: [birth year], 61 y.o.   MRN: [id number]  DOS:  12/27/2021 Type of visit - description: Follow-up  Saw cardiology, note reviewed. Complaint of for skin problems: Skin is in general dry and very itchy.  He has a place near the right elbow that wound healed, has a scab on and off. Continue with left ear discomfort, some itching.  Ongoing left sinus pressure and allergies. Cholesterol medication changed.  Next Review of Systems See above   Past Medical History:  Diagnosis Date   Acquired hypothyroidism 11/18/2014   Annual physical exam 03/01/2020   Anxiety    Benign essential hypertension 11/18/2014   CAD (coronary artery disease)    CAD in native artery 10/23/2018   Chronic insomnia 11/18/2014   Dupuytren's contracture of left hand 11/25/2019   GERD (gastroesophageal reflux disease)    GERD without esophagitis 10/23/2018   Hyperlipidemia    Hypertension    no meds now   Hypertensive urgency 09/28/2013   Hypothyroidism    LBBB (left bundle branch block) 09/29/2013   Left bundle branch block    OSA (obstructive sleep apnea)    PCP NOTES >>>>>>>>>>>>>> 03/02/2020   Retinal detachment 06/2018   Right   Retinal detachment, right 11/25/2019   Seasonal allergies    Thrombocytopenia (HCC)--history of, reports extensive w/u (early 2000, ITP?) 11/29/2020   Thyroid disease     Past Surgical History:  Procedure Laterality Date   BASAL CELL CARCINOMA EXCISION N/A 10/30/2020   Procedure: Excision of scalp squamous cell carcinoma with frozen section, application of integra;  Surgeon: Cindra Presume, MD;  Location: [facility];  Service: Plastics;  Laterality: N/A;  1 hour, please   DUPUYTREN CONTRACTURE RELEASE Left 05/2016   FASCIECTOMY Left 07/11/2016   Procedure: FASCIECTOMY left index possible joint release;  Surgeon: Daryll Brod, MD;  Location: [facility];  Service: Orthopedics;  Laterality: Left;  FASCIECTOMY  left index possible joint release   KNEE SURGERY     Left eye retna repaired      MOHS SURGERY  2017   NASAL FRACTURE SURGERY     RETINAL DETACHMENT SURGERY Right 06/2018   TOE SURGERY     TONSILLECTOMY      Current Outpatient Medications  Medication Instructions   aspirin EC 81 mg, Oral, Daily   azelastine (ASTELIN) 0.1 % nasal spray 2 sprays, Each Nare, At bedtime PRN, Use in each nostril as directed   fexofenadine (ALLEGRA) 180 mg, Daily at bedtime   fluticasone (FLONASE) 50 MCG/ACT nasal spray 1 spray, Each Nare, As needed   hydrocortisone 2.5 % lotion 1 application., Topical, As needed   levothyroxine (SYNTHROID) 112 mcg, Oral, Daily before breakfast   magnesium oxide (MAG-OX) 400 mg, Oral, Daily   metroNIDAZOLE (METROGEL) 3.01 % gel 1 application., Topical, As needed   Multiple Vitamin (MULTIVITAMIN WITH MINERALS) TABS tablet 1 tablet, Oral, Daily, Unknown stregnth   Omega-3 3,800 mg, Oral, Daily   pravastatin (PRAVACHOL) 40 mg, Oral, Every evening   Vitamin D 5,000 Units, Oral, Daily   zolpidem (AMBIEN CR) 6.25 mg, Oral, At bedtime PRN, for sleep       Objective:   Physical Exam BP 122/66   Pulse 70   Temp 97.8 F (36.6 C) (Oral)   Resp 16   Ht '6\' 3"'$  (1.905 m)   Wt 180 lb (81.6 kg)   SpO2 98%   BMI 22.50  kg/m  General:   Well developed, NAD, BMI noted. HEENT:  Normocephalic . Face symmetric, atraumatic Lungs:  CTA B Normal respiratory effort, no intercostal retractions, no accessory muscle use. Heart: RRR,  no murmur.  Lower extremities: no pretibial edema bilaterally  Skin: Near the right elbow has a 1x1 cm area of chronic irritation.  Extensive sun damage noted Neurologic:  alert & oriented X3.  Speech normal, gait appropriate for age and unassisted Psych--  Cognition and judgment appear intact.  Cooperative with normal attention span and concentration.  Behavior appropriate. No anxious or depressed appearing.      Assessment    ASSESSMENT (new  patient 11/2019) HTN, history of hypertensive urgency High cholesterol Thyroid disease CAD, LBBB (+ LAD disease per coronary CT, neg a stress test 2017) OSA - borderline per pt  Dupuytren's Right retinal detachment 2019 Viola Rosacea dx ~ 2017, seen @ [facility], increased LFTs w/ tetracycline per chart review Retinal detachment , surgery R 22019, L 05-2020 Thrombocytopenia: w/u in [facility] ~ 1990s, early 2000s, including a BM Bx , ITP ?  PLAN  TSH YQI:[id number] on no medications.  BP is good. Hypothyroidism: Check TSH, will need a 90-day refill with results.  CAD, decreased EF Saw cardiology 10/19/2021, previous EF was 40 to 45%, echo repeated 10/19/2021: EF 50 to 55%, medical management recommended. High cholesterol, patient c/o aches and pains to cardiology.  Crestor was started which to pravastatin 11-06-21.  F/u labs per cardiology Insomnia: On Ambien, needs a 90-day refill.  PDMP reviewed.  Prescription sent Skin lesion: At the right arm, has extensive skin damage, needs to see dermatology, patient aware, will call with the name of the dermatologist he likes to see.   Left ear discomfort: Ongoing, saw ENT 08-2021, a  endoscopy was performed.  Symptoms continue, recommend to see ENT again RTC 6 months  Here for CPX Sinusitis, L OMA: Augmentin x7 days, Flonase, Astepro HTN: BP today is normal, on no BP meds. High cholesterol: Last FLP satisfactory, continue Crestor Insomnia: On Ambien, contract today.  Rx provided.  Contract signed. Thyroid disease: Checking labs, RF provided CAD, LBBB: Currently asymptomatic. Thrombocytopenia: Chronic issue, see last entry.  Checking a CBC. LUTS: Complains of nocturia since he changed his diet in May.  DRE normal, check another PSA, UA urine culture.  Offered tamsulosin, declined. RTC 6 months

## 2021-12-28 ENCOUNTER — Other Ambulatory Visit: Payer: Self-pay

## 2021-12-28 MED ORDER — LEVOTHYROXINE SODIUM 112 MCG PO TABS: 112.0000 ug | ORAL_TABLET | Freq: Every day | ORAL | 1 refills | Status: DC

## 2021-12-28 NOTE — Assessment & Plan Note (Signed)
OHC:SPZZCKICH on no medications.  BP is good. Hypothyroidism: Check TSH, will need a 90-day refill with results. CAD, decreased EF Saw cardiology 10/19/2021, previous EF was 40 to 45%, echo repeated 10/19/2021: EF 50 to 55%, medical management recommended. High cholesterol, patient c/o aches and pains to cardiology.  Crestor was started which to pravastatin 11-06-21.  F/u labs per cardiology Insomnia: On Ambien, needs a 90-day refill.  PDMP reviewed.  Prescription sent Skin lesion: At the right arm, has extensive skin damage, needs to see dermatology, patient aware, will call with the name of the dermatologist he likes to see. Left ear discomfort: Ongoing, saw ENT 08-2021, a  endoscopy was performed.  Symptoms continue, recommend to see ENT again RTC 6 months

## 2022-02-15 DIAGNOSIS — E782 Mixed hyperlipidemia: Secondary | ICD-10-CM | POA: Diagnosis not present

## 2022-02-16 LAB — LIPID PANEL
Chol/HDL Ratio: 2.8 ratio (ref 0.0–5.0)
Cholesterol, Total: 151 mg/dL (ref 100–199)
HDL: 54 mg/dL (ref >39)
LDL Chol Calc (NIH): 85 mg/dL (ref 0–99)
Triglycerides: 61 mg/dL (ref 0–149)
VLDL Cholesterol Cal: 12 mg/dL (ref 5–40)

## 2022-02-16 LAB — HEPATIC FUNCTION PANEL
ALT: 22 IU/L (ref 0–44)
AST: 17 IU/L (ref 0–40)
Albumin: 4.5 g/dL (ref 3.9–4.9)
Alkaline Phosphatase: 56 IU/L (ref 44–121)
Bilirubin Total: 0.9 mg/dL (ref 0.0–1.2)
Bilirubin, Direct: 0.24 mg/dL (ref 0.00–0.40)
Total Protein: 6.2 g/dL (ref 6.0–8.5)

## 2022-02-26 ENCOUNTER — Telehealth: Payer: Self-pay

## 2022-02-26 NOTE — Telephone Encounter (Signed)
-----   Message from Park Liter, MD sent at 02/19/2022  3:21 PM EDT ----- Cholesterol looks reasonable, continue present management for now

## 2022-02-26 NOTE — Telephone Encounter (Signed)
Patient notified of results.

## 2022-04-04 DIAGNOSIS — Z1211 Encounter for screening for malignant neoplasm of colon: Secondary | ICD-10-CM | POA: Diagnosis not present

## 2022-04-04 DIAGNOSIS — K648 Other hemorrhoids: Secondary | ICD-10-CM | POA: Diagnosis not present

## 2022-04-04 LAB — HM COLONOSCOPY

## 2022-04-25 DIAGNOSIS — J019 Acute sinusitis, unspecified: Secondary | ICD-10-CM | POA: Diagnosis not present

## 2022-06-10 ENCOUNTER — Encounter: Payer: Self-pay | Admitting: Internal Medicine

## 2022-06-11 MED ORDER — ZOLPIDEM TARTRATE ER 12.5 MG PO TBCR: 12.5000 mg | EXTENDED_RELEASE_TABLET | Freq: Every evening | ORAL | 0 refills | Status: DC | PRN

## 2022-06-11 NOTE — Telephone Encounter (Signed)
Pdmp ok, rx sent  ? ?

## 2022-06-12 ENCOUNTER — Encounter: Payer: Self-pay | Admitting: Internal Medicine

## 2022-06-15 DIAGNOSIS — A499 Bacterial infection, unspecified: Secondary | ICD-10-CM | POA: Diagnosis not present

## 2022-07-04 ENCOUNTER — Encounter: Payer: BC Managed Care – PPO | Admitting: Internal Medicine

## 2022-07-08 ENCOUNTER — Other Ambulatory Visit: Payer: Self-pay | Admitting: Internal Medicine

## 2022-07-11 DIAGNOSIS — G44219 Episodic tension-type headache, not intractable: Secondary | ICD-10-CM | POA: Diagnosis not present

## 2022-07-11 DIAGNOSIS — R0982 Postnasal drip: Secondary | ICD-10-CM | POA: Diagnosis not present

## 2022-07-11 DIAGNOSIS — J0191 Acute recurrent sinusitis, unspecified: Secondary | ICD-10-CM | POA: Diagnosis not present

## 2022-08-01 DIAGNOSIS — J018 Other acute sinusitis: Secondary | ICD-10-CM | POA: Diagnosis not present

## 2022-08-01 DIAGNOSIS — J209 Acute bronchitis, unspecified: Secondary | ICD-10-CM | POA: Diagnosis not present

## 2022-08-27 ENCOUNTER — Ambulatory Visit: Payer: BC Managed Care – PPO | Admitting: Family

## 2022-08-27 ENCOUNTER — Ambulatory Visit (HOSPITAL_BASED_OUTPATIENT_CLINIC_OR_DEPARTMENT_OTHER)
Admission: RE | Admit: 2022-08-27 | Discharge: 2022-08-27 | Disposition: A | Discharge status: Home / Self Care | Payer: BC Managed Care – PPO | Source: Ambulatory Visit | Attending: Family | Admitting: Family

## 2022-08-27 ENCOUNTER — Telehealth: Payer: Self-pay | Admitting: Internal Medicine

## 2022-08-27 ENCOUNTER — Telehealth: Payer: Self-pay

## 2022-08-27 VITALS — BP 148/100 | HR 106 | Temp 98.5°F | Resp 16 | Wt 180.0 lb

## 2022-08-27 DIAGNOSIS — R1030 Lower abdominal pain, unspecified: Secondary | ICD-10-CM | POA: Diagnosis not present

## 2022-08-27 DIAGNOSIS — R109 Unspecified abdominal pain: Secondary | ICD-10-CM

## 2022-08-27 DIAGNOSIS — R1010 Upper abdominal pain, unspecified: Secondary | ICD-10-CM

## 2022-08-27 DIAGNOSIS — I10 Essential (primary) hypertension: Secondary | ICD-10-CM

## 2022-08-27 MED ORDER — LOSARTAN POTASSIUM 25 MG PO TABS
25.0000 mg | ORAL_TABLET | Freq: Every day | ORAL | 0 refills | Status: DC
Start: 2022-08-27 — End: 2022-11-20

## 2022-08-27 MED ORDER — OMEPRAZOLE 40 MG PO CPDR: 40.0000 mg | DELAYED_RELEASE_CAPSULE | Freq: Every day | ORAL | 3 refills | Status: DC

## 2022-08-27 MED ORDER — METRONIDAZOLE 500 MG PO TABS: 500.0000 mg | ORAL_TABLET | Freq: Three times a day (TID) | ORAL | 0 refills | Status: AC

## 2022-08-27 MED ORDER — CIPROFLOXACIN HCL 500 MG PO TABS: 500.0000 mg | ORAL_TABLET | Freq: Two times a day (BID) | ORAL | 0 refills | Status: AC

## 2022-08-27 NOTE — Patient Instructions (Addendum)
Continue miralax 1 capful once daily in beverage of choice until stools are very soft. Try a fleets enema this evening.  Begin antibiotics tonight. Complete x-ray on the first floor this evening. We will work on getting CT scan scheduled for tomorrow.  Start losartan for blood pressure.

## 2022-08-27 NOTE — Telephone Encounter (Signed)
Initial Comment Caller states that he has pain in lower abdomen and distention. He states that the pain is mild to moderate but won't go away. He has been dealing with GERD and there is a little bit of a salty taste in his mouth. He is not sure if this is something related to constipation. Translation No Nurse Assessment Nurse: Larene Pickett, RN, Marcie Bal Date/Time Eilene Ghazi Time): 08/27/2022 2:31:32 PM Confirm and document reason for call. If symptomatic, describe symptoms. ---Caller states that he has pain in lower abdomen and distention. He has been struggling with constipation for awhile. Having BMs but doesn't feel like he is fully emptying his bowels. He states that the pain is mild to moderate but won't go away. He has been dealing with GERD and started on Omeprazole last month. There is a little bit of a salty taste in his mouth. He is not sure if this is something related to constipation. Took a bunch of laxatives over the weekend so he is having BMs but doesn't feel like he is empty. Denies fever. Does the patient have any new or worsening symptoms? ---Yes Will a triage be completed? ---Yes Related visit to physician within the last 2 weeks? ---No Does the PT have any chronic conditions? (i.e. diabetes, asthma, this includes High risk factors for pregnancy, etc.) ---Yes List chronic conditions. ---GERD Left bundle block Hypothyroidism Is this a behavioral health or substance abuse call? ---No PLEASE NOTE: All timestamps contained within this report are represented as [place] Standard Time. CONFIDENTIALTY NOTICE: This fax transmission is intended only for the addressee. It contains information that is legally privileged, confidential or otherwise protected from use or disclosure. If you are not the intended recipient, you are strictly prohibited from reviewing, disclosing, copying using or disseminating any of this information or taking any action in reliance on or regarding this  information. If you have received this fax in error, please notify us immediately by telephone so that we can arrange for its return to [place]. Phone: [phone], Toll-Free: [phone], Fax: [phone] Page: 2 of 2 Call Id: [id number] Guidelines Guideline Title Affirmed Question Affirmed Notes Nurse Date/Time Eilene Ghazi Time) Abdominal Pain - Male [1] MILDMODERATE pain AND [2] constant AND [3] present > 2 hours Bobby Rumpf 08/27/2022 2:36:42 PM Disp. Time Eilene Ghazi Time) Disposition Final User 08/27/2022 2:30:23 PM Send to Urgent Queue Wynema Birch 08/27/2022 2:38:49 PM See HCP within 4 Hours (or PCP triage) Yes Larene Pickett, RN, Marcie Bal Final Disposition 08/27/2022 2:38:49 PM See HCP within 4 Hours (or PCP triage) Yes Larene Pickett, RN, Lenon Oms Disagree/Comply Comply Caller Understands Yes PreDisposition Call Doctor Care Advice Given Per Guideline SEE HCP (OR PCP TRIAGE) WITHIN 4 HOURS: * IF OFFICE WILL BE OPEN: You need to be seen within the next 3 or 4 hours. Call your doctor (or NP/PA) now or as soon as the office opens. NOTHING BY MOUTH: * Do not eat or drink anything for now. CALL BACK IF: * You become worse CARE ADVICE given per Abdominal Pain - Male (Adult) guideline. Referrals Warm transfer to backline

## 2022-08-27 NOTE — Progress Notes (Unsigned)
Subjective:     Patient ID: Peter Spencer, male    DOB: 06-Jun-1961, 62 y.o.   MRN: [id number]  Chief Complaint  Patient presents with   Constipation    Complains of constipation on and off, reports taking laxatives during the weekend. Now going but "not normal"   Gastroesophageal Reflux    Complains of GERD   Bloated    Complains of feeling bloated    HPI Patient is in today for to discuss constipation. Reports some swelling below the navel.  Notes that he has been struggling with constiapation.  Saw ENT last month because he thought he had a sinus infection. He was told that he had silent gerd.  Notes worseing gerd symptoms despite omeprazole.  Feels like his bowels aren't emptying.  Notes some growling and flatulence that comes and goes. Notes a salty taste in his mouth. Also notes a huge amount of stress in his family.  He is concerned that there might be something else going on.  He was only pooping "a little bit."  He has been moving bowels regularly but not a full void.  This started around christmas.  Denies fever. Emptying bladder without difficulty. Appetite is "decent." Trying to stay hydrated.   BP Readings from Last 3 Encounters:  08/27/22 (!) 163/93  12/27/21 122/66  10/19/21 136/90     He took took some pills and 3 capfuls of miralax.  Health Maintenance Due  Topic Date Due   INFLUENZA VACCINE  02/26/2022   COVID-19 Vaccine (4 - 2023-24 season) 03/29/2022    Past Medical History:  Diagnosis Date   Acquired hypothyroidism 11/18/2014   Annual physical exam 03/01/2020   Anxiety    Benign essential hypertension 11/18/2014   CAD (coronary artery disease)    CAD in native artery 10/23/2018   Chronic insomnia 11/18/2014   Dupuytren's contracture of left hand 11/25/2019   GERD (gastroesophageal reflux disease)    GERD without esophagitis 10/23/2018   Hyperlipidemia    Hypertension    no meds now   Hypertensive urgency 09/28/2013   Hypothyroidism    LBBB (left  bundle branch block) 09/29/2013   Left bundle branch block    OSA (obstructive sleep apnea)    PCP NOTES >>>>>>>>>>>>>> 03/02/2020   Retinal detachment 06/2018   Right   Retinal detachment, right 11/25/2019   Seasonal allergies    Thrombocytopenia (HCC)--history of, reports extensive w/u (early 2000, ITP?) 11/29/2020   Thyroid disease     Past Surgical History:  Procedure Laterality Date   BASAL CELL CARCINOMA EXCISION N/A 10/30/2020   Procedure: Excision of scalp squamous cell carcinoma with frozen section, application of integra;  Surgeon: Cindra Presume, MD;  Location: [facility];  Service: Plastics;  Laterality: N/A;  1 hour, please   DUPUYTREN CONTRACTURE RELEASE Left 05/2016   FASCIECTOMY Left 07/11/2016   Procedure: FASCIECTOMY left index possible joint release;  Surgeon: Daryll Brod, MD;  Location: [facility];  Service: Orthopedics;  Laterality: Left;  FASCIECTOMY left index possible joint release   KNEE SURGERY     Left eye retna repaired      MOHS SURGERY  2017   NASAL FRACTURE SURGERY     RETINAL DETACHMENT SURGERY Right 06/2018   TOE SURGERY     TONSILLECTOMY      Family History  Problem Relation Age of Onset   CAD Other    Pancreatic cancer Mother    Atrial fibrillation Father  Dementia Father    Parkinsonism Father    Stroke Neg Hx    Diabetes Mellitus II Neg Hx    Colon cancer Neg Hx    Prostate cancer Neg Hx     Social History   Socioeconomic History   Marital status: Married    Spouse name: Not on file   Number of children: 2   Years of education: Not on file   Highest education level: Not on file  Occupational History   Occupation: self employed    Occupation: Freight forwarder, [employer]  Tobacco Use   Smoking status: Never   Smokeless tobacco: Never  Vaping Use   Vaping Use: Never used  Substance and Sexual Activity   Alcohol use: Yes    Comment: one glass of wine 4 nights a week   Drug use: No   Sexual activity: Not on  file  Other Topics Concern   Not on file  Social History Narrative   Household: pt and wife   Social Determinants of Health   Financial Resource Strain: Not on file  Food Insecurity: Not on file  Transportation Needs: Not on file  Physical Activity: Not on file  Stress: Not on file  Social Connections: Not on file  Intimate Partner Violence: Not on file    Outpatient Medications Prior to Visit  Medication Sig Dispense Refill   aspirin EC 81 MG tablet Take 81 mg by mouth daily.     azelastine (ASTELIN) 0.1 % nasal spray Place 2 sprays into both nostrils at bedtime as needed for rhinitis or allergies. Use in each nostril as directed 30 mL 12   Cholecalciferol (VITAMIN D) 125 MCG (5000 UT) CAPS Take 5,000 Units by mouth daily.     fexofenadine (ALLEGRA) 180 MG tablet Take 180 mg by mouth at bedtime.     fluticasone (FLONASE) 50 MCG/ACT nasal spray Place 1 spray into both nostrils as needed for allergies or rhinitis.     hydrocortisone 2.5 % lotion Apply 1 application topically as needed (rocesea).     levothyroxine (SYNTHROID) 112 MCG tablet Take 1 tablet (112 mcg total) by mouth daily before breakfast. 90 tablet 0   magnesium oxide (MAG-OX) 400 MG tablet Take 400 mg by mouth daily.     metroNIDAZOLE (METROGEL) 0.75 % gel Apply 1 application. topically as needed (rash).     Multiple Vitamin (MULTIVITAMIN WITH MINERALS) TABS tablet Take 1 tablet by mouth daily. Unknown stregnth     Omega-3 1000 MG CAPS Take 3,800 mg by mouth daily.     pravastatin (PRAVACHOL) 40 MG tablet Take 1 tablet (40 mg total) by mouth every evening. 90 tablet 3   zolpidem (AMBIEN CR) 12.5 MG CR tablet Take 1 tablet (12.5 mg total) by mouth at bedtime as needed for sleep. 90 tablet 0   No facility-administered medications prior to visit.    Allergies  Allergen Reactions   Dust Mite Extract    Mold Extract [Trichophyton]    Tetracyclines & Related Other (See Comments)    Caused liver enzymes to go up    Sulfa Antibiotics Nausea And Vomiting and Nausea Only    ROS     Objective:    Physical Exam  BP (!) 163/93 (BP Location: Right Arm, Patient Position: Sitting, Cuff Size: Small)   Pulse (!) 106   Temp 98.5 F (36.9 C) (Oral)   Resp 16   Wt 180 lb (81.6 kg)   SpO2 98%   BMI 22.50 kg/m  Wt Readings from Last 3 Encounters:  08/27/22 180 lb (81.6 kg)  12/27/21 180 lb (81.6 kg)  10/19/21 174 lb (78.9 kg)       Assessment & Plan:   Problem List Items Addressed This Visit   None   I am having Cristopher Estimable. Pfohl maintain his fexofenadine, multivitamin with minerals, aspirin EC, magnesium oxide, fluticasone, Vitamin D, azelastine, hydrocortisone, metroNIDAZOLE, Omega-3, pravastatin, zolpidem, and levothyroxine.  No orders of the defined types were placed in this encounter.

## 2022-08-27 NOTE — Telephone Encounter (Signed)
Patient called and listed the following symptoms: issues in lower abdomen, distention,bad gerd, constipation, salty taste in mouth. Transferred to triage.

## 2022-08-27 NOTE — Telephone Encounter (Signed)
Laure Kidney, [place]      08/27/22  3:01 PM Note Triage Nurse called stated Pt need to seen today Spoke with Dr.Paz and was advised he don't have any appt today. Pt can see another provider today if available to be seen.   Spoke with Melissa regarding appointment ok to see pt today at 420 And pt agree to appt. Patient called and listed the following symptoms: issues in lower abdomen, distention,bad gerd, constipation, salty taste in mouth. Transferred to triage.       Pt seeing Melissa today.

## 2022-08-27 NOTE — Telephone Encounter (Signed)
error 

## 2022-08-27 NOTE — Telephone Encounter (Signed)
Triage Nurse called stated Pt need to seen today Spoke with Dr.Paz and was advised he don't have any appt today. Pt can see another provider today if available to be seen.  Spoke with Melissa regarding appointment ok to see pt today at 420 And pt agree to appt. Patient called and listed the following symptoms: issues in lower abdomen, distention,bad gerd, constipation, salty taste in mouth. Transferred to triage.

## 2022-08-28 ENCOUNTER — Telehealth: Payer: Self-pay | Admitting: Internal Medicine

## 2022-08-28 ENCOUNTER — Ambulatory Visit (HOSPITAL_BASED_OUTPATIENT_CLINIC_OR_DEPARTMENT_OTHER)
Admission: RE | Admit: 2022-08-28 | Discharge: 2022-08-28 | Disposition: A | Discharge status: Home / Self Care | Payer: BC Managed Care – PPO | Source: Ambulatory Visit | Attending: Family | Admitting: Family

## 2022-08-28 DIAGNOSIS — R1010 Upper abdominal pain, unspecified: Secondary | ICD-10-CM | POA: Diagnosis not present

## 2022-08-28 DIAGNOSIS — R1032 Left lower quadrant pain: Secondary | ICD-10-CM | POA: Diagnosis not present

## 2022-08-28 DIAGNOSIS — R1030 Lower abdominal pain, unspecified: Secondary | ICD-10-CM | POA: Insufficient documentation

## 2022-08-28 LAB — COMPREHENSIVE METABOLIC PANEL
ALT: 16 U/L (ref 0–53)
AST: 16 U/L (ref 0–37)
Albumin: 4.6 g/dL (ref 3.5–5.2)
Alkaline Phosphatase: 57 U/L (ref 39–117)
BUN: 20 mg/dL (ref 6–23)
CO2: 28 mEq/L (ref 19–32)
Calcium: 9.1 mg/dL (ref 8.4–10.5)
Chloride: 99 mEq/L (ref 96–112)
Creatinine, Ser: 0.99 mg/dL (ref 0.40–1.50)
GFR: 80.78 mL/min (ref >60.00)
Glucose, Bld: 91 mg/dL (ref 70–99)
Potassium: 4 mEq/L (ref 3.5–5.1)
Sodium: 136 mEq/L (ref 135–145)
Total Bilirubin: 0.7 mg/dL (ref 0.2–1.2)
Total Protein: 6.6 g/dL (ref 6.0–8.3)

## 2022-08-28 LAB — CBC WITH DIFFERENTIAL/PLATELET
Basophils Absolute: 0 10*3/uL (ref 0.0–0.1)
Basophils Relative: 0.8 % (ref 0.0–3.0)
Eosinophils Absolute: 0.1 10*3/uL (ref 0.0–0.7)
Eosinophils Relative: 2.5 % (ref 0.0–5.0)
HCT: 40.5 % (ref 39.0–52.0)
Hemoglobin: 14.4 g/dL (ref 13.0–17.0)
Lymphocytes Relative: 28 % (ref 12.0–46.0)
Lymphs Abs: 1.1 10*3/uL (ref 0.7–4.0)
MCHC: 35.6 g/dL (ref 30.0–36.0)
MCV: 93.3 fl (ref 78.0–100.0)
Monocytes Absolute: 0.5 10*3/uL (ref 0.1–1.0)
Monocytes Relative: 11.4 % (ref 3.0–12.0)
Neutro Abs: 2.3 10*3/uL (ref 1.4–7.7)
Neutrophils Relative %: 57.3 % (ref 43.0–77.0)
Platelets: 130 10*3/uL — ABNORMAL LOW (ref 150.0–400.0)
RBC: 4.34 Mil/uL (ref 4.22–5.81)
RDW: 12.8 % (ref 11.5–15.5)
WBC: 4.1 10*3/uL (ref 4.0–10.5)

## 2022-08-28 MED ORDER — IOHEXOL 300 MG/ML  SOLN
100.0000 mL | Freq: Once | INTRAMUSCULAR | Status: AC | PRN
  Administered 2022-08-28: 100 mL via INTRAVENOUS

## 2022-08-28 NOTE — Telephone Encounter (Signed)
Patient unable to get in touch with imaging regarding setting up his CT scan. He has left a voicemail but hasn't heard anything. Patient would like help getting some kind of movement since Debbrah Alar wanted him to have it done today. Please advise patient if there is anything that can be done on our end.

## 2022-08-28 NOTE — Telephone Encounter (Signed)
Per radiology they talked to patient and he is scheduled for today at 5 pm

## 2022-08-28 NOTE — Assessment & Plan Note (Addendum)
New.  Suspect constipation but I am also concerned with possibility of diverticulitis. Will obtain KUB this evening and plan CT abd/pelvis in the AM.  Begin empiric cipro/flagyl.  Discussed the following for constipation symptoms:   Continue miralax 1 capful once daily in beverage of choice until stools are very soft. Try a fleets enema this evening.

## 2022-08-28 NOTE — Assessment & Plan Note (Signed)
Uncontrolled. Add losartan. Follow up with PCP in 1 week.

## 2022-08-29 ENCOUNTER — Encounter: Payer: Self-pay | Admitting: Family

## 2022-09-05 ENCOUNTER — Other Ambulatory Visit: Payer: Self-pay | Admitting: Family

## 2022-09-05 ENCOUNTER — Other Ambulatory Visit: Payer: Self-pay | Admitting: Internal Medicine

## 2022-09-05 DIAGNOSIS — K5909 Other constipation: Secondary | ICD-10-CM

## 2022-09-05 NOTE — Telephone Encounter (Signed)
Letter sent to Pt via Mychart informing he is overdue for CPX to schedule at his convenience.

## 2022-09-05 NOTE — Telephone Encounter (Signed)
Requesting: Ambien 12.'5mg'$   Contract: 06/28/21 UDS: Ambien only Last Visit: 12/27/21 Next Visit: None Last Refill: 06/11/22 #90 and 0RF    Letter sent to Pt via [place] he is overdue for CPX to schedule at his convenience.   Please Advise

## 2022-09-05 NOTE — Telephone Encounter (Signed)
Send him a message: Due for a follow-up.  No further refills without visit PDMP okay, Rx sent

## 2022-09-10 ENCOUNTER — Other Ambulatory Visit: Payer: Self-pay | Admitting: Family

## 2022-09-10 DIAGNOSIS — K5900 Constipation, unspecified: Secondary | ICD-10-CM

## 2022-09-11 ENCOUNTER — Ambulatory Visit (HOSPITAL_BASED_OUTPATIENT_CLINIC_OR_DEPARTMENT_OTHER)
Admission: RE | Admit: 2022-09-11 | Discharge: 2022-09-11 | Disposition: A | Discharge status: Home / Self Care | Payer: BC Managed Care – PPO | Source: Ambulatory Visit | Attending: Family | Admitting: Family

## 2022-09-11 DIAGNOSIS — K5900 Constipation, unspecified: Secondary | ICD-10-CM | POA: Diagnosis not present

## 2022-09-16 ENCOUNTER — Telehealth: Payer: Self-pay | Admitting: Gastroenterology

## 2022-09-16 ENCOUNTER — Encounter: Payer: Self-pay | Admitting: Family

## 2022-09-16 NOTE — Telephone Encounter (Signed)
Good morning Dr. Bryan Lemma,   Supervising Provider 09/16/22 AM   We received a call from this patient wishing to transfer his care over to [facility] to treat constipation. Patient had a colonoscopy in 03/2022 with Dr. Everlean Alstrom at [facility]. Patient since then has switched primary care providers to [facility] and would like to transfer all of his care to the [facility] system. Records are in [place] for your review. Please advise on scheduling.   Thank you.

## 2022-09-18 NOTE — Telephone Encounter (Signed)
Left voicemail for patient to call back and schedule OV.

## 2022-09-29 ENCOUNTER — Other Ambulatory Visit: Payer: Self-pay | Admitting: Internal Medicine

## 2022-10-08 ENCOUNTER — Other Ambulatory Visit: Payer: Self-pay | Admitting: Cardiology

## 2022-10-21 DIAGNOSIS — L538 Other specified erythematous conditions: Secondary | ICD-10-CM | POA: Diagnosis not present

## 2022-10-21 DIAGNOSIS — L821 Other seborrheic keratosis: Secondary | ICD-10-CM | POA: Diagnosis not present

## 2022-10-21 DIAGNOSIS — L578 Other skin changes due to chronic exposure to nonionizing radiation: Secondary | ICD-10-CM | POA: Diagnosis not present

## 2022-10-21 DIAGNOSIS — D485 Neoplasm of uncertain behavior of skin: Secondary | ICD-10-CM | POA: Diagnosis not present

## 2022-10-21 DIAGNOSIS — C4442 Squamous cell carcinoma of skin of scalp and neck: Secondary | ICD-10-CM | POA: Diagnosis not present

## 2022-10-29 DIAGNOSIS — C44622 Squamous cell carcinoma of skin of right upper limb, including shoulder: Secondary | ICD-10-CM | POA: Diagnosis not present

## 2022-10-29 DIAGNOSIS — D492 Neoplasm of unspecified behavior of bone, soft tissue, and skin: Secondary | ICD-10-CM | POA: Diagnosis not present

## 2022-10-29 DIAGNOSIS — L570 Actinic keratosis: Secondary | ICD-10-CM | POA: Diagnosis not present

## 2022-10-29 DIAGNOSIS — C44629 Squamous cell carcinoma of skin of left upper limb, including shoulder: Secondary | ICD-10-CM | POA: Diagnosis not present

## 2022-11-04 DIAGNOSIS — C4492 Squamous cell carcinoma of skin, unspecified: Secondary | ICD-10-CM | POA: Insufficient documentation

## 2022-11-06 ENCOUNTER — Other Ambulatory Visit: Payer: Self-pay | Admitting: Internal Medicine

## 2022-11-06 MED ORDER — ZOLPIDEM TARTRATE ER 12.5 MG PO TBCR: 12.5000 mg | EXTENDED_RELEASE_TABLET | Freq: Every evening | ORAL | 1 refills | Status: DC | PRN

## 2022-11-06 NOTE — Telephone Encounter (Signed)
Prescription Request  11/06/2022  Is this a "Controlled Substance" medicine? Yes  LOV: Visit date not found  What is the name of the medication or equipment?  zolpidem (AMBIEN CR) 12.5 MG CR tablet   Have you contacted your pharmacy to request a refill? No   Which pharmacy would you like this sent to?  [facility] [address] [place], [place] - [address] [place] [address] Phone: [phone] Fax: [phone]   Patient notified that their request is being sent to the clinical staff for review and that they should receive a response within 2 business days.   Please advise at [facility] [phone]

## 2022-11-06 NOTE — Telephone Encounter (Signed)
Requesting: Ambien CR 12.5mg   Contract: 06/28/21 UDS: None, Ambien only Last Visit: 12/27/21 Next Visit: 11/20/22 Last Refill: 09/05/22 #30 and 1rf  Please Advise

## 2022-11-12 ENCOUNTER — Ambulatory Visit: Payer: BC Managed Care – PPO | Admitting: Gastroenterology

## 2022-11-12 ENCOUNTER — Encounter: Payer: Self-pay | Admitting: Gastroenterology

## 2022-11-12 VITALS — BP 130/88 | HR 94 | Ht 74.0 in | Wt 183.0 lb

## 2022-11-12 DIAGNOSIS — Z86010 Personal history of colonic polyps: Secondary | ICD-10-CM | POA: Diagnosis not present

## 2022-11-12 DIAGNOSIS — K219 Gastro-esophageal reflux disease without esophagitis: Secondary | ICD-10-CM

## 2022-11-12 DIAGNOSIS — R1013 Epigastric pain: Secondary | ICD-10-CM

## 2022-11-12 DIAGNOSIS — R194 Change in bowel habit: Secondary | ICD-10-CM

## 2022-11-12 DIAGNOSIS — R12 Heartburn: Secondary | ICD-10-CM

## 2022-11-12 MED ORDER — PANTOPRAZOLE SODIUM 40 MG PO TBEC: 40.0000 mg | DELAYED_RELEASE_TABLET | Freq: Every day | ORAL | 3 refills | Status: DC

## 2022-11-12 MED ORDER — NA SULFATE-K SULFATE-MG SULF 17.5-3.13-1.6 GM/177ML PO SOLN: 1.0000 | Freq: Once | ORAL | 0 refills | Status: AC

## 2022-11-12 NOTE — Patient Instructions (Addendum)
You have been scheduled for an endoscopy and colonoscopy. Please follow the written instructions given to you at your visit today. Please pick up your prep supplies at the pharmacy within the next 1-3 days. If you use inhalers (even only as needed), please bring them with you on the day of your procedure.  We have sent the following medications to your pharmacy for you to pick up at your convenience: Protonix _______________________________________________________  If your blood pressure at your visit was 140/90 or greater, please contact your primary care physician to follow up on this.  _______________________________________________________  If you are age 32 or older, your body mass index should be between 23-30. Your Body mass index is 23.5 kg/m. If this is out of the aforementioned range listed, please consider follow up with your Primary Care Provider.  If you are age 45 or younger, your body mass index should be between 19-25. Your Body mass index is 23.5 kg/m. If this is out of the aformentioned range listed, please consider follow up with your Primary Care Provider.   __________________________________________________________  The [facility] providers would like to encourage you to use [facility] to communicate with providers for non-urgent requests or questions.  Due to long hold times on the telephone, sending your provider a message by [facility] may be a faster and more efficient way to get a response.  Please allow 48 business hours for a response.  Please remember that this is for non-urgent requests.   Due to recent changes in healthcare laws, you may see the results of your imaging and laboratory studies on MyChart before your provider has had a chance to review them.  We understand that in some cases there may be results that are confusing or concerning to you. Not all laboratory results come back in the same time frame and the provider may be waiting for multiple results in order  to interpret others.  Please give [place] 48 hours in order for your provider to thoroughly review all the results before contacting the office for clarification of your results.   Thank you for choosing me and [facility].  Vito Cirigliano, D.O.

## 2022-11-12 NOTE — Progress Notes (Signed)
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  Has been having significantly increased amount of family stress for the last 6 months or so.  Constipation seems to coincide with increased stressors, described as hard stools, decreased frequency, straining, with associated bloating and lower abdominal discomfort.  No prior similar symptoms.  Was seen by his PCM for this issue on 08/27/2022.  Recommended MiraLAX 1 cap daily and fleets enema x 1.  Was also started on Cipro/Flagyl for clinical concern of diverticulitis. - 08/27/2022: Normal CMP, CBC (PLT 130 and chronic/stable) - 08/27/2022: Abdominal x-ray: Mildly prominent stool throughout the colon without obstruction - 08/28/2022: CT A/P: Stool noted throughout the colon.  Otherwise normal - 09/11/2022: Abdominal x-ray: Moderate retained colonic stool, otherwise unremarkable  Did a Miralax purge with improvement, then Miralax 1 cap/daily with loose stools for 1 week. Has since stopped the Miralax. Now with soft stools and no straining.   Separately, history of GERD, diagnosed by ENT. Some improvement with Prilosec, but still breakthrough. Takes qhs.  Index symptoms of increased throat clearing, cough, sinus drainage, belching, dyspepsia, HB, regurgitation. No dysphagia. On/off for 15 years, but much worse since increased stressors.   Was seen in the ENT clinic on 07/11/2022 for history of recurrent sinus infections.  Nasal endoscopy with normal-appearing sinuses and suspected possible migraines and prescribed Topamax, along with Prilosec to 40 mg daily x 4 weeks.  No prior EGD.  Family history notable for mother with colon polyps.  No known family history of colon cancer,  IBD.  Endoscopic History: - 01/15/2013: Colonoscopy (Dr. Conley Rolls at [facility]): 8 mm polyp removed from hepatic flexure with hot snare and cold biopsy forceps (SSP), 7 mm polyp and diminutive polyp removed from sigmoid (tubular adenomas), grade 1 internal hemorrhoids, sigmoid diverticulosis - 11/27/2015: Colonoscopy (Dr. Conley Rolls): Pandiverticulosis, grade 2 internal hemorrhoids, otherwise normal colon.  Repeat in 5 years - 04/04/2022: Colonoscopy (Dr. Conley Rolls): Medium internal hemorrhoids.  Fair prep.  Recommended repeat in 1 year due to suboptimal bowel preparation  Past Medical History:  Diagnosis Date   Acquired hypothyroidism 11/18/2014   Annual physical exam 03/01/2020   Anxiety    Benign essential hypertension 11/18/2014   CAD (coronary artery disease)    CAD in native artery 10/23/2018   Chronic insomnia 11/18/2014   Dupuytren's contracture of left hand 11/25/2019   GERD (gastroesophageal reflux disease)    GERD without esophagitis 10/23/2018   Hyperlipidemia    Hypertension    no meds now   Hypertensive urgency 09/28/2013   Hypothyroidism    LBBB (left bundle branch block) 09/29/2013   Left bundle branch block    OSA (obstructive sleep apnea)    PCP NOTES >>>>>>>>>>>>>> 03/02/2020   Retinal detachment 06/2018   Right   Retinal detachment, right 11/25/2019   SCC (squamous cell carcinoma)    Seasonal allergies    Thrombocytopenia (HCC)--history of, reports extensive w/u (early 2000, ITP?) 11/29/2020   Thyroid disease      Past Surgical History:  Procedure Laterality Date   BASAL CELL CARCINOMA EXCISION N/A 10/30/2020   Procedure: Excision of scalp squamous cell carcinoma with frozen section, application of  integra;  Surgeon: Allena Napoleon, MD;  Location: [facility];  Service: Plastics;  Laterality: N/A;  1 hour, please   DUPUYTREN CONTRACTURE RELEASE Left 05/2016   FASCIECTOMY Left 07/11/2016   Procedure: FASCIECTOMY left index possible joint release;  Surgeon: Cindee Salt, MD;  Location:  SURGERY CENTER;  Service: Orthopedics;  Laterality: Left;  FASCIECTOMY left index possible joint release   KNEE SURGERY     Left eye retna repaired      MOHS SURGERY  2017   NASAL FRACTURE SURGERY     RETINAL DETACHMENT SURGERY Right 06/2018   TOE SURGERY     TONSILLECTOMY     Family History  Problem Relation Age of Onset   Pancreatic cancer Mother    Atrial fibrillation Father    Dementia Father    Parkinsonism Father    CAD Other    Stroke Neg Hx    Diabetes Mellitus II Neg Hx    Colon cancer Neg Hx    Prostate cancer Neg Hx    Liver disease Neg Hx    Esophageal cancer Neg Hx    Social History   Tobacco Use   Smoking status: Never   Smokeless tobacco: Never  Vaping Use   Vaping Use: Never used  Substance Use Topics   Alcohol use: Yes   Drug use: No   Current Outpatient Medications  Medication Sig Dispense Refill   aspirin EC 81 MG tablet Take 81 mg by mouth daily.     azelastine (ASTELIN) 0.1 % nasal spray Place 2 sprays into both nostrils at bedtime as needed for rhinitis or allergies. Use in each nostril as directed 30 mL 12   Cholecalciferol (VITAMIN D) 125 MCG (5000 UT) CAPS Take 5,000 Units by mouth daily.     fexofenadine (ALLEGRA) 180 MG tablet Take 180 mg by mouth at bedtime.     fluticasone (FLONASE) 50 MCG/ACT nasal spray Place 1 spray into both nostrils as needed for allergies or rhinitis.     hydrocortisone 2.5 % lotion Apply 1 application topically as needed (rocesea).     levothyroxine (SYNTHROID) 112 MCG tablet TAKE 1 TABLET BY MOUTH DAILY BEFORE BREAKFAST. 30 tablet 0   losartan (COZAAR) 25 MG tablet Take 1 tablet (25 mg total) by mouth daily. 90 tablet 0   magnesium oxide (MAG-OX) 400 MG tablet Take 400 mg by mouth daily.     metroNIDAZOLE (METROGEL) 0.75 % gel Apply 1 application. topically as needed (rash).     Multiple Vitamin (MULTIVITAMIN WITH MINERALS) TABS tablet Take 1 tablet by mouth daily. Unknown stregnth      Omega-3 1000 MG CAPS Take 3,800 mg by mouth daily.     omeprazole (PRILOSEC) 40 MG capsule Take 1 capsule (40 mg total) by mouth daily. 30 capsule 3   pravastatin (PRAVACHOL) 40 MG tablet Take 1 tablet (40 mg total) by mouth every evening. Patient needs appointment for further refills. 1 st attempt 30 tablet 0   zolpidem (AMBIEN CR) 12.5 MG CR tablet Take 1 tablet (12.5 mg total) by mouth at bedtime as needed. for sleep 30 tablet 1   No current facility-administered medications for this visit.   Allergies  Allergen Reactions   Dust Mite Extract    Mold Extract [Trichophyton]    Tetracyclines & Related Other (See Comments)    Caused liver enzymes to go up   Sulfa Antibiotics Nausea And Vomiting and Nausea Only     Review of Systems:  All systems reviewed and negative except where noted in HPI.     Physical Exam:    Wt Readings from Last 3 Encounters:  11/12/22 183 lb (83 kg)  08/27/22 180 lb (81.6 kg)  12/27/21 180 lb (81.6 kg)    Ht 6\' 2"  (1.88 m)   Wt 183 lb (83 kg)   BMI 23.50 kg/m  Constitutional:  Pleasant, in no acute distress. Psychiatric: Normal mood and affect. Behavior is normal. Neurological: Alert and oriented to person place and time. Skin: Skin is warm and dry. No rashes noted.   ASSESSMENT AND PLAN;   1) History of colon polyps - Due for repeat colonoscopy for ongoing polyp surveillance - Schedule colonoscopy  2) Constipation 3) Change in bowel habits - By clinical history, related to significant underlying family stressors.  Symptoms responsive to MiraLAX purge prep, and has since titrated off laxative therapy - Continue adequate hydration and healthy eating habits  4) GERD 5) Heartburn 6) Dyspepsia - Suboptimal response to adequate trial of omeprazole.  Will stop and exchanged for Protonix 40 mg daily, to take 30-60 minutes prior to dinner given predominance of nocturnal symptoms - EGD to evaluate for erosive esophagitis, LES laxity, hiatal  hernia, along with evaluation for PUD, gastritis with random and directed biopsies - Barrett's Esophagus screening at time of EGD as above   The indications, risks, and benefits of EGD and colonoscopy were explained to the patient in detail. Risks include but are not limited to bleeding, perforation, adverse reaction to medications, and cardiopulmonary compromise. Sequelae include but are not limited to the possibility of surgery, hospitalization, and mortality. The patient verbalized understanding and wished to proceed. All questions answered, referred to scheduler and bowel prep ordered. Further recommendations pending results of the exam.      Shellia Cleverly, DO, FACG  11/12/2022, 8:43 AM   Wanda Plump, MD

## 2022-11-13 ENCOUNTER — Other Ambulatory Visit (HOSPITAL_COMMUNITY): Payer: Self-pay

## 2022-11-13 DIAGNOSIS — H31093 Other chorioretinal scars, bilateral: Secondary | ICD-10-CM | POA: Diagnosis not present

## 2022-11-13 DIAGNOSIS — H04123 Dry eye syndrome of bilateral lacrimal glands: Secondary | ICD-10-CM | POA: Diagnosis not present

## 2022-11-13 DIAGNOSIS — H35373 Puckering of macula, bilateral: Secondary | ICD-10-CM | POA: Diagnosis not present

## 2022-11-18 DIAGNOSIS — C4492 Squamous cell carcinoma of skin, unspecified: Secondary | ICD-10-CM | POA: Diagnosis not present

## 2022-11-18 DIAGNOSIS — L218 Other seborrheic dermatitis: Secondary | ICD-10-CM | POA: Diagnosis not present

## 2022-11-18 DIAGNOSIS — C44622 Squamous cell carcinoma of skin of right upper limb, including shoulder: Secondary | ICD-10-CM | POA: Diagnosis not present

## 2022-11-18 DIAGNOSIS — L718 Other rosacea: Secondary | ICD-10-CM | POA: Diagnosis not present

## 2022-11-18 DIAGNOSIS — D492 Neoplasm of unspecified behavior of bone, soft tissue, and skin: Secondary | ICD-10-CM | POA: Diagnosis not present

## 2022-11-20 ENCOUNTER — Ambulatory Visit: Payer: BC Managed Care – PPO | Admitting: Internal Medicine

## 2022-11-20 VITALS — BP 138/82 | HR 88 | Temp 98.1°F | Resp 16 | Ht 74.0 in | Wt 185.8 lb

## 2022-11-20 DIAGNOSIS — Z79899 Other long term (current) drug therapy: Secondary | ICD-10-CM | POA: Diagnosis not present

## 2022-11-20 DIAGNOSIS — F5104 Psychophysiologic insomnia: Secondary | ICD-10-CM

## 2022-11-20 DIAGNOSIS — I10 Essential (primary) hypertension: Secondary | ICD-10-CM

## 2022-11-20 DIAGNOSIS — E039 Hypothyroidism, unspecified: Secondary | ICD-10-CM | POA: Diagnosis not present

## 2022-11-20 DIAGNOSIS — E782 Mixed hyperlipidemia: Secondary | ICD-10-CM | POA: Diagnosis not present

## 2022-11-20 MED ORDER — LOSARTAN POTASSIUM 25 MG PO TABS
25.0000 mg | ORAL_TABLET | Freq: Every day | ORAL | 2 refills | Status: DC
Start: 2022-11-20 — End: 2023-03-14

## 2022-11-20 MED ORDER — PRAVASTATIN SODIUM 40 MG PO TABS: 40.0000 mg | ORAL_TABLET | Freq: Every evening | ORAL | 2 refills | Status: DC

## 2022-11-20 MED ORDER — ZOLPIDEM TARTRATE ER 12.5 MG PO TBCR: 12.5000 mg | EXTENDED_RELEASE_TABLET | Freq: Every evening | ORAL | 1 refills | Status: DC | PRN

## 2022-11-20 NOTE — Patient Instructions (Signed)
Check the  blood pressure regularly BP GOAL is between 110/65 and  135/85. If it is consistently higher or lower, let me know   GO TO THE LAB : Get the blood work    

## 2022-11-20 NOTE — Progress Notes (Unsigned)
Subjective:    Patient ID: Peter Spencer, male    DOB: 1960-08-11, 62 y.o.   MRN: [id number]  DOS:  11/20/2022 Type of visit - description: f/u  chronic medical problems were reviewed. Good med compliance, needs several refills. Was seen by [facility]   on January 2024 with abdominal pain.  Note and  results reviewed. Subsequently saw GI.  Note reviewed. History of CAD: Denies chest pain or difficulty breathing.   Review of Systems See above   Past Medical History:  Diagnosis Date   Acquired hypothyroidism 11/18/2014   Annual physical exam 03/01/2020   Anxiety    Benign essential hypertension 11/18/2014   CAD (coronary artery disease)    CAD in native artery 10/23/2018   Chronic insomnia 11/18/2014   Dupuytren's contracture of left hand 11/25/2019   GERD (gastroesophageal reflux disease)    GERD without esophagitis 10/23/2018   Hyperlipidemia    Hypertension    no meds now   Hypertensive urgency 09/28/2013   Hypothyroidism    LBBB (left bundle branch block) 09/29/2013   Left bundle branch block    OSA (obstructive sleep apnea)    PCP NOTES >>>>>>>>>>>>>> 03/02/2020   Retinal detachment 06/2018   Right   Retinal detachment, right 11/25/2019   SCC (squamous cell carcinoma)    Seasonal allergies    Thrombocytopenia (HCC)--history of, reports extensive w/u (early 2000, ITP?) 11/29/2020   Thyroid disease     Past Surgical History:  Procedure Laterality Date   BASAL CELL CARCINOMA EXCISION N/A 10/30/2020   Procedure: Excision of scalp squamous cell carcinoma with frozen section, application of integra;  Surgeon: Allena Napoleon, MD;  Location: [facility];  Service: Plastics;  Laterality: N/A;  1 hour, please   DUPUYTREN CONTRACTURE RELEASE Left 05/2016   FASCIECTOMY Left 07/11/2016   Procedure: FASCIECTOMY left index possible joint release;  Surgeon: Cindee Salt, MD;  Location: [facility];  Service: Orthopedics;  Laterality: Left;   FASCIECTOMY left index possible joint release   KNEE SURGERY     Left eye retna repaired      MOHS SURGERY  2017   NASAL FRACTURE SURGERY     RETINAL DETACHMENT SURGERY Right 06/2018   TOE SURGERY     TONSILLECTOMY      Current Outpatient Medications  Medication Instructions   aspirin EC 81 mg, Oral, Daily   azelastine (ASTELIN) 0.1 % nasal spray 2 sprays, Each Nare, At bedtime PRN, Use in each nostril as directed   fexofenadine (ALLEGRA) 180 mg, Daily at bedtime   fluticasone (FLONASE) 50 MCG/ACT nasal spray 1 spray, Each Nare, As needed   hydrocortisone 2.5 % lotion 1 application , Topical, As needed   levothyroxine (SYNTHROID) 112 mcg, Oral, Daily before breakfast   losartan (COZAAR) 25 mg, Oral, Daily   magnesium oxide (MAG-OX) 400 mg, Oral, Daily   metroNIDAZOLE (METROGEL) 0.75 % gel 1 application , Topical, As needed   Multiple Vitamin (MULTIVITAMIN WITH MINERALS) TABS tablet 1 tablet, Oral, Daily, Unknown stregnth   Omega-3 3,800 mg, Oral, Daily   omeprazole (PRILOSEC) 40 mg, Oral, Daily   pantoprazole (PROTONIX) 40 mg, Oral, Daily   pravastatin (PRAVACHOL) 40 mg, Oral, Every evening, Patient needs appointment for further refills. 1 st attempt   Vitamin D 5,000 Units, Oral, Daily   zolpidem (AMBIEN CR) 12.5 mg, Oral, At bedtime PRN, for sleep       Objective:   Physical Exam BP 138/82 (BP Location: Right Arm,  Patient Position: Sitting, Cuff Size: Normal)   Pulse 88   Temp 98.1 F (36.7 C) (Oral)   Resp 16   Ht  (1.88 m)   Wt 185 lb 12.8 oz (84.3 kg)   SpO2 98%   BMI 23.86 kg/m  General:   Well developed, NAD, BMI noted. HEENT:  Normocephalic . Face symmetric, atraumatic Lungs:  CTA B Normal respiratory effort, no intercostal retractions, no accessory muscle use. Heart: RRR,  no murmur.  Lower extremities: no pretibial edema bilaterally  Skin: Not pale. Not jaundice Neurologic:  alert & oriented X3.  Speech normal, gait appropriate for age and  unassisted Psych--  Cognition and judgment appear intact.  Cooperative with normal attention span and concentration.  Behavior appropriate. No anxious or depressed appearing.      Assessment    ASSESSMENT (new patient 11/2019) HTN, history of hypertensive urgency High cholesterol Thyroid disease CAD, LBBB (+ LAD disease per coronary CT, neg a stress test 2017) OSA - borderline per pt  Dupuytren's Right retinal detachment 2019 [facility] Rosacea dx ~ 2017, seen @ [place] derm, increased LFTs w/ tetracycline per chart review Retinal detachment , surgery R 22019, L 05-2020 Thrombocytopenia: w/u in [facility] ~ 1990s, early 2000s, including a BM Bx , ITP ?  PLAN HTN: Seems well-controlled, recommend to check ambulatory BPs at home, last BMP satisfactory.  RF losartan. High cholesterol: Refilled Pravachol, last FLP satisfactory. Hypothyroidism: Check TSH, refill 90 days with results. Stress: Reports that has been under significant amount of stress, family related, did not like to go into details. Insomnia: RF Ambien, needs 90-day supply, PDMP reviewed. CAD: Asymptomatic.  Recommend to see cardiology. OSA: Reports she uses a dental device and is doing well. Skin cancer: Since LOV, saw dermatology, Dx with SCC, to have surgery including mohs on the scalp. GI: Was seen by Efraim Kaufmann also w/ abdominal pain on January, abd CT and labs were okay.  Saw GI, they are planning a colonoscopy and EGD due to significant heartburn and dyspepsia. RTC: Scheduled for June.  Recommend to keep the appointment with me.

## 2022-11-21 LAB — DRUG MONITORING PANEL 376104, URINE
Amphetamines: NEGATIVE ng/mL (ref <500)
Barbiturates: NEGATIVE ng/mL (ref <300)
Benzodiazepines: NEGATIVE ng/mL (ref <100)
Cocaine Metabolite: NEGATIVE ng/mL (ref <150)
Desmethyltramadol: NEGATIVE ng/mL (ref <100)
Opiates: NEGATIVE ng/mL (ref <100)
Oxycodone: NEGATIVE ng/mL (ref <100)
Tramadol: NEGATIVE ng/mL (ref <100)

## 2022-11-21 LAB — DM TEMPLATE

## 2022-11-21 LAB — TSH: TSH: 2.51 u[IU]/mL (ref 0.35–5.50)

## 2022-11-21 NOTE — Assessment & Plan Note (Signed)
HTN: Seems well-controlled, recommend to check ambulatory BPs at home, last BMP satisfactory.  RF losartan. High cholesterol: Refilled Pravachol, last FLP satisfactory. Hypothyroidism: Check TSH, refill 90 days with results. Stress: Reports that has been under significant amount of stress, family related, did not like to go into details. Insomnia: RF Ambien, needs 90-day supply, PDMP reviewed. CAD: Asymptomatic.  Recommend to see cardiology. OSA: Reports she uses a dental device and is doing well. Skin cancer: Since LOV, saw dermatology, Dx with SCC, to have surgery including mohs on the scalp. GI: Was seen by Efraim Kaufmann also w/ abdominal pain on January, abd CT and labs were okay.  Saw GI, they are planning a colonoscopy and EGD due to significant heartburn and dyspepsia. RTC: Scheduled for June.  Recommend to keep the appointment with me.

## 2022-11-25 MED ORDER — LEVOTHYROXINE SODIUM 112 MCG PO TABS: 112.0000 ug | ORAL_TABLET | Freq: Every day | ORAL | 0 refills | Status: DC

## 2022-11-25 NOTE — Addendum Note (Signed)
Addended byConrad Hibbing D on: 11/25/2022 07:57 AM   Modules accepted: Orders

## 2022-11-27 ENCOUNTER — Encounter: Payer: Self-pay | Admitting: Gastroenterology

## 2022-12-04 ENCOUNTER — Encounter: Payer: Self-pay | Admitting: Certified Registered Nurse Anesthetist

## 2022-12-06 DIAGNOSIS — D0461 Carcinoma in situ of skin of right upper limb, including shoulder: Secondary | ICD-10-CM | POA: Diagnosis not present

## 2022-12-06 DIAGNOSIS — C44622 Squamous cell carcinoma of skin of right upper limb, including shoulder: Secondary | ICD-10-CM | POA: Diagnosis not present

## 2022-12-09 ENCOUNTER — Ambulatory Visit (AMBULATORY_SURGERY_CENTER): Payer: BC Managed Care – PPO | Admitting: Gastroenterology

## 2022-12-09 ENCOUNTER — Encounter: Payer: Self-pay | Admitting: Gastroenterology

## 2022-12-09 ENCOUNTER — Telehealth: Payer: Self-pay

## 2022-12-09 VITALS — BP 120/84 | HR 75 | Temp 98.1°F | Resp 11 | Ht 74.0 in | Wt 183.0 lb

## 2022-12-09 DIAGNOSIS — D124 Benign neoplasm of descending colon: Secondary | ICD-10-CM

## 2022-12-09 DIAGNOSIS — Z09 Encounter for follow-up examination after completed treatment for conditions other than malignant neoplasm: Secondary | ICD-10-CM

## 2022-12-09 DIAGNOSIS — K5730 Diverticulosis of large intestine without perforation or abscess without bleeding: Secondary | ICD-10-CM

## 2022-12-09 DIAGNOSIS — K2970 Gastritis, unspecified, without bleeding: Secondary | ICD-10-CM | POA: Diagnosis not present

## 2022-12-09 DIAGNOSIS — K2100 Gastro-esophageal reflux disease with esophagitis, without bleeding: Secondary | ICD-10-CM | POA: Diagnosis not present

## 2022-12-09 DIAGNOSIS — D127 Benign neoplasm of rectosigmoid junction: Secondary | ICD-10-CM

## 2022-12-09 DIAGNOSIS — K641 Second degree hemorrhoids: Secondary | ICD-10-CM

## 2022-12-09 DIAGNOSIS — D122 Benign neoplasm of ascending colon: Secondary | ICD-10-CM

## 2022-12-09 DIAGNOSIS — Z86010 Personal history of colonic polyps: Secondary | ICD-10-CM | POA: Diagnosis not present

## 2022-12-09 DIAGNOSIS — K635 Polyp of colon: Secondary | ICD-10-CM | POA: Diagnosis not present

## 2022-12-09 MED ORDER — PROTONIX 40 MG PO TBEC: 40.0000 mg | DELAYED_RELEASE_TABLET | Freq: Two times a day (BID) | ORAL | 3 refills | Status: DC

## 2022-12-09 MED ORDER — SODIUM CHLORIDE 0.9 % IV SOLN: 500.0000 mL | Freq: Once | INTRAVENOUS | Status: DC

## 2022-12-09 NOTE — Telephone Encounter (Signed)
PA request received via CMM for Pantoprazole Sodium 40MG  dr tablets  PA has been submitted to [facility] and is pending additional questions/determination  Key: [id number]

## 2022-12-09 NOTE — Op Note (Signed)
[facility] Patient Name: Peter Spencer Procedure Date: 12/09/2022 7:19 AM MRN: [id number] Endoscopist: Doristine Locks , MD, [id number] Age: 62 Referring MD:  Date of Birth: September 06, 1960 Gender: Male Account #: [id number] Procedure:                Upper GI endoscopy Indications:              Epigastric abdominal pain, Heartburn, Esophageal                            reflux, Dyspepsia Medicines:                Monitored Anesthesia Care Procedure:                Pre-Anesthesia Assessment:                           - Prior to the procedure, a History and Physical                            was performed, and patient medications and                            allergies were reviewed. The patient's tolerance of                            previous anesthesia was also reviewed. The risks                            and benefits of the procedure and the sedation                            options and risks were discussed with the patient.                            All questions were answered, and informed consent                            was obtained. Prior Anticoagulants: The patient has                            taken no anticoagulant or antiplatelet agents. ASA                            Grade Assessment: III - A patient with severe                            systemic disease. After reviewing the risks and                            benefits, the patient was deemed in satisfactory                            condition to undergo the procedure.  After obtaining informed consent, the endoscope was                            passed under direct vision. Throughout the                            procedure, the patient's blood pressure, pulse, and                            oxygen saturations were monitored continuously. The                            GIF [id number] #[id number] was introduced through the                            mouth, and advanced to the second  part of duodenum.                            The upper GI endoscopy was accomplished without                            difficulty. The patient tolerated the procedure                            well. Scope In: Scope Out: Findings:                 LA Grade A (one or more mucosal breaks less than 5                            mm, not extending between tops of 2 mucosal folds)                            esophagitis with no bleeding was found at the                            gastroesophageal junction.                           The gastroesophageal flap valve was visualized                            endoscopically and classified as Hill Grade II                            (fold present, opens with respiration).                           Localized mild inflammation characterized by                            erosions and erythema was found in the gastric                            antrum. Biopsies were taken  with a cold forceps for                            histology. Estimated blood loss was minimal.                           The gastric fundus, gastric body and gastric antrum                            were normal.                           The examined duodenum was normal. Complications:            No immediate complications. Estimated Blood Loss:     Estimated blood loss was minimal. Impression:               - LA Grade A reflux esophagitis with no bleeding.                           - Gastroesophageal flap valve classified as Hill                            Grade II (fold present, opens with respiration).                           - Gastritis. Biopsied.                           - Normal gastric fundus, gastric body and antrum.                           - Normal examined duodenum. Recommendation:           - Patient has a contact number available for                            emergencies. The signs and symptoms of potential                            delayed complications were  discussed with the                            patient. Return to normal activities tomorrow.                            Written discharge instructions were provided to the                            patient.                           - Resume previous diet.                           - Continue present medications.                           -  Await pathology results.                           - Increase Protonix (pantoprazole) to 40 mg PO BID                            for 6 weeks to promote mucosal healing, then reduce                            back to 40 mg daily if no longer requiring high                            dose PPI from a reflux control standpoint. Doristine Locks, MD 12/09/2022 8:24:28 AM

## 2022-12-09 NOTE — Progress Notes (Signed)
0740 Robinul 0.1 mg IV given due large amount of secretions upon assessment.  MD made aware, vss 

## 2022-12-09 NOTE — Progress Notes (Signed)
Called to room to assist during endoscopic procedure.  Patient ID and intended procedure confirmed with present staff. Received instructions for my participation in the procedure from the performing physician.  

## 2022-12-09 NOTE — Progress Notes (Signed)
VS completed by CW   Pt's states no medical or surgical changes since previsit or office visit.  

## 2022-12-09 NOTE — Op Note (Signed)
[facility] Patient Name: Peter Spencer Procedure Date: 12/09/2022 7:19 AM MRN: [id number] Endoscopist: Doristine Locks , MD, [id number] Age: 62 Referring MD:  Date of Birth: 22-Dec-1960 Gender: Male Account #: [id number] Procedure:                Colonoscopy Indications:              High risk colon cancer surveillance: Personal                            history of colonic polyps                           Endoscopic History:                           - 01/15/2013: Colonoscopy (Dr. Conley Rolls at [facility]): 8                            mm polyp removed from hepatic flexure with hot                            snare and cold biopsy forceps (SSP), 7 mm polyp and                            diminutive polyp removed from sigmoid (tubular                            adenomas), grade 1 internal hemorrhoids, sigmoid                            diverticulosis                           - 11/27/2015: Colonoscopy (Dr. Conley Rolls):                            Pandiverticulosis, grade 2 internal hemorrhoids,                            otherwise normal colon. Repeat in 5 years                           - 04/04/2022: Colonoscopy (Dr. Conley Rolls): Medium internal                            hemorrhoids. Fair prep. Recommended repeat in 1                            year due to suboptimal bowel preparation Medicines:                Monitored Anesthesia Care Procedure:                Pre-Anesthesia Assessment:                           - Prior to the procedure, a History and Physical  was performed, and patient medications and                            allergies were reviewed. The patient's tolerance of                            previous anesthesia was also reviewed. The risks                            and benefits of the procedure and the sedation                            options and risks were discussed with the patient.                            All questions were answered, and informed  consent                            was obtained. Prior Anticoagulants: The patient has                            taken no anticoagulant or antiplatelet agents                            except for aspirin. ASA Grade Assessment: III - A                            patient with severe systemic disease. After                            reviewing the risks and benefits, the patient was                            deemed in satisfactory condition to undergo the                            procedure.                           After obtaining informed consent, the colonoscope                            was passed under direct vision. Throughout the                            procedure, the patient's blood pressure, pulse, and                            oxygen saturations were monitored continuously. The                            CF [id number] #[id number] was introduced through the anus  and advanced to the the cecum, identified by                            appendiceal orifice and ileocecal valve. The                            colonoscopy was performed without difficulty. The                            patient tolerated the procedure well. The quality                            of the bowel preparation was good. The ileocecal                            valve, appendiceal orifice, and rectum were                            photographed. Scope In: 7:51:19 AM Scope Out: 8:17:35 AM Scope Withdrawal Time: 0 hours 22 minutes 49 seconds  Total Procedure Duration: 0 hours 26 minutes 16 seconds  Findings:                 Hemorrhoids were found on perianal exam.                           Two sessile polyps were found in the ascending                            colon. The polyps were 3 to 5 mm in size. These                            polyps were removed with a cold snare. Resection                            and retrieval were complete. Estimated blood loss                             was minimal.                           Two semi-sessile polyps were found in the                            descending colon. The polyps were 2 to 6 mm in                            size. These polyps were removed with a cold snare.                            Resection and retrieval were complete. Estimated                            blood loss was minimal.  Two sessile polyps were found in the recto-sigmoid                            colon. The polyps were 2 to 3 mm in size. These                            polyps were removed with a cold snare. Resection                            and retrieval were complete. Estimated blood loss                            was minimal.                           Multiple large-mouthed and small-mouthed                            diverticula were found in the entire colon.                           Non-bleeding internal hemorrhoids were found during                            retroflexion. The hemorrhoids were Grade II                            (internal hemorrhoids that prolapse but reduce                            spontaneously). Complications:            No immediate complications. Estimated Blood Loss:     Estimated blood loss was minimal. Impression:               - Hemorrhoids found on perianal exam.                           - Two 3 to 5 mm polyps in the ascending colon,                            removed with a cold snare. Resected and retrieved.                           - Two 2 to 6 mm polyps in the descending colon,                            removed with a cold snare. Resected and retrieved.                           - Two 2 to 3 mm polyps at the recto-sigmoid colon,                            removed with a cold snare. Resected and retrieved.                           -  Diverticulosis in the entire examined colon.                           - Non-bleeding internal hemorrhoids.                           - The GI  Genius (intelligent endoscopy module),                            computer-aided polyp detection system powered by AI                            was utilized to detect colorectal polyps through                            enhanced visualization during colonoscopy. Recommendation:           - Patient has a contact number available for                            emergencies. The signs and symptoms of potential                            delayed complications were discussed with the                            patient. Return to normal activities tomorrow.                            Written discharge instructions were provided to the                            patient.                           - Resume previous diet.                           - Continue present medications.                           - Await pathology results.                           - Repeat colonoscopy for surveillance based on                            pathology results.                           - Return to GI clinic PRN. Doristine Locks, MD 12/09/2022 8:29:30 AM

## 2022-12-09 NOTE — Progress Notes (Signed)
GASTROENTEROLOGY PROCEDURE H&P NOTE   Primary Care Physician: Wanda Plump, MD    Reason for Procedure:  GERD, change in bowel habits, constipation, abdominal bloating, colon polyp surveillance  Plan:    EGD, colonoscopy  Patient is appropriate for endoscopic procedure(s) in the ambulatory ([facility]) setting.  The nature of the procedure, as well as the risks, benefits, and alternatives were carefully and thoroughly reviewed with the patient. Ample time for discussion and questions allowed. The patient understood, was satisfied, and agreed to proceed.     HPI: Peter Spencer is a 61 y.o. male who presents for EGD and colonoscopy for evaluation of multiple GI symptoms to include GERD, change in bowel habits (constipation), abdominal bloating, along with continued colon polyp surveillance.  Patient was most recently seen in the Gastroenterology Clinic on 11/12/2022 by me.  No interval change in medical history since that appointment. Please refer to that note for full details regarding GI history and clinical presentation.   Endoscopic History: - 01/15/2013: Colonoscopy (Dr. Conley Rolls at [facility]): 8 mm polyp removed from hepatic flexure with hot snare and cold biopsy forceps (SSP), 7 mm polyp and diminutive polyp removed from sigmoid (tubular adenomas), grade 1 internal hemorrhoids, sigmoid diverticulosis - 11/27/2015: Colonoscopy (Dr. Conley Rolls): Pandiverticulosis, grade 2 internal hemorrhoids, otherwise normal colon.  Repeat in 5 years - 04/04/2022: Colonoscopy (Dr. Conley Rolls): Medium internal hemorrhoids.  Fair prep.  Recommended repeat in 1 year due to suboptimal bowel preparation  Past Medical History:  Diagnosis Date   Acquired hypothyroidism 11/18/2014   Annual physical exam 03/01/2020   Anxiety    Benign essential hypertension 11/18/2014   CAD (coronary artery disease)    CAD in native artery 10/23/2018   Chronic insomnia 11/18/2014   Dupuytren's contracture of left hand 11/25/2019   GERD  (gastroesophageal reflux disease)    GERD without esophagitis 10/23/2018   Hyperlipidemia    Hypertension    no meds now   Hypertensive urgency 09/28/2013   Hypothyroidism    LBBB (left bundle branch block) 09/29/2013   Left bundle branch block    OSA (obstructive sleep apnea)    PCP NOTES >>>>>>>>>>>>>> 03/02/2020   Retinal detachment 06/2018   Right   Retinal detachment, right 11/25/2019   SCC (squamous cell carcinoma)    Seasonal allergies    Thrombocytopenia (HCC)--history of, reports extensive w/u (early 2000, ITP?) 11/29/2020   Thyroid disease     Past Surgical History:  Procedure Laterality Date   BASAL CELL CARCINOMA EXCISION N/A 10/30/2020   Procedure: Excision of scalp squamous cell carcinoma with frozen section, application of integra;  Surgeon: Allena Napoleon, MD;  Location: [facility];  Service: Plastics;  Laterality: N/A;  1 hour, please   COLONOSCOPY  01/15/2013   .   COLONOSCOPY  11/27/2015   [facility]. Devona Konig, MD. Diverticulosis of the whole colon, ascending colon and descending colon. Grade 2 internal hemorrhoids. Repeat in 5 years.   DUPUYTREN CONTRACTURE RELEASE Left 05/2016   FASCIECTOMY Left 07/11/2016   Procedure: FASCIECTOMY left index possible joint release;  Surgeon: Cindee Salt, MD;  Location: [facility];  Service: Orthopedics;  Laterality: Left;  FASCIECTOMY left index possible joint release   KNEE SURGERY     Left eye retna repaired      MOHS SURGERY  2017   NASAL FRACTURE SURGERY     RETINAL DETACHMENT SURGERY Right 06/2018   TOE SURGERY     TONSILLECTOMY  Prior to Admission medications   Medication Sig Start Date End Date Taking? Authorizing Provider  aspirin EC 81 MG tablet Take 81 mg by mouth daily.   Yes [provider]  Cholecalciferol (VITAMIN D) 125 MCG (5000 UT) CAPS Take 5,000 Units by mouth daily.   Yes [provider]  fexofenadine (ALLEGRA) 180 MG tablet Take 180  mg by mouth at bedtime.   Yes [provider]  fluorouracil (EFUDEX) 5 % cream Apply topically. 10/01/21  Yes [provider]  hydrocortisone 2.5 % lotion Apply 1 application topically as needed (rocesea). 09/04/20  Yes [provider]  ketoconazole (NIZORAL) 2 % cream APPLY TO AFFECTED AREA TWICE A DAY 11/18/22  Yes [provider]  levothyroxine (SYNTHROID) 112 MCG tablet Take 1 tablet (112 mcg total) by mouth daily before breakfast. 11/25/22  Yes Paz, Nolon Rod, MD  losartan (COZAAR) 25 MG tablet Take 1 tablet (25 mg total) by mouth daily. 11/20/22  Yes Paz, Nolon Rod, MD  magnesium oxide (MAG-OX) 400 MG tablet Take 400 mg by mouth daily.   Yes [provider]  metroNIDAZOLE (METROGEL) 0.75 % gel Apply 1 application. topically as needed (rash). 06/07/20  Yes [provider]  Multiple Vitamin (MULTIVITAMIN WITH MINERALS) TABS tablet Take 1 tablet by mouth daily. Unknown stregnth   Yes [provider]  Omega-3 1000 MG CAPS Take 3,800 mg by mouth daily.   Yes [provider]  pantoprazole (PROTONIX) 40 MG tablet Take 1 tablet (40 mg total) by mouth daily. 11/12/22  Yes Vernadine Coombs V, DO  pravastatin (PRAVACHOL) 40 MG tablet Take 1 tablet (40 mg total) by mouth every evening. Patient needs appointment for further refills. 1 st attempt 11/20/22  Yes Paz, Nolon Rod, MD  zolpidem (AMBIEN CR) 12.5 MG CR tablet Take 1 tablet (12.5 mg total) by mouth at bedtime as needed. for sleep 11/20/22  Yes Paz, Nolon Rod, MD  azelastine (ASTELIN) 0.1 % nasal spray Place 2 sprays into both nostrils at bedtime as needed for rhinitis or allergies. Use in each nostril as directed 12/20/19   Wanda Plump, MD  fluticasone [facility]) 50 MCG/ACT nasal spray Place 1 spray into both nostrils as needed for allergies or rhinitis.    [provider]  omeprazole (PRILOSEC) 40 MG capsule Take 1 capsule (40 mg total) by mouth daily. 08/27/22   Sandford Craze, NP     Current Outpatient Medications  Medication Sig Dispense Refill   aspirin EC 81 MG tablet Take 81 mg by mouth daily.     Cholecalciferol (VITAMIN D) 125 MCG (5000 UT) CAPS Take 5,000 Units by mouth daily.     fexofenadine (ALLEGRA) 180 MG tablet Take 180 mg by mouth at bedtime.     fluorouracil (EFUDEX) 5 % cream Apply topically.     hydrocortisone 2.5 % lotion Apply 1 application topically as needed (rocesea).     ketoconazole (NIZORAL) 2 % cream APPLY TO AFFECTED AREA TWICE A DAY     levothyroxine (SYNTHROID) 112 MCG tablet Take 1 tablet (112 mcg total) by mouth daily before breakfast. 90 tablet 0   losartan (COZAAR) 25 MG tablet Take 1 tablet (25 mg total) by mouth daily. 90 tablet 2   magnesium oxide (MAG-OX) 400 MG tablet Take 400 mg by mouth daily.     metroNIDAZOLE (METROGEL) 0.75 % gel Apply 1 application. topically as needed (rash).     Multiple Vitamin (MULTIVITAMIN WITH MINERALS) TABS tablet Take 1 tablet by mouth  daily. Unknown stregnth     Omega-3 1000 MG CAPS Take 3,800 mg by mouth daily.     pantoprazole (PROTONIX) 40 MG tablet Take 1 tablet (40 mg total) by mouth daily. 90 tablet 3   pravastatin (PRAVACHOL) 40 MG tablet Take 1 tablet (40 mg total) by mouth every evening. Patient needs appointment for further refills. 1 st attempt 90 tablet 2   zolpidem (AMBIEN CR) 12.5 MG CR tablet Take 1 tablet (12.5 mg total) by mouth at bedtime as needed. for sleep 90 tablet 1   azelastine (ASTELIN) 0.1 % nasal spray Place 2 sprays into both nostrils at bedtime as needed for rhinitis or allergies. Use in each nostril as directed 30 mL 12   fluticasone (FLONASE) 50 MCG/ACT nasal spray Place 1 spray into both nostrils as needed for allergies or rhinitis.     omeprazole (PRILOSEC) 40 MG capsule Take 1 capsule (40 mg total) by mouth daily. 30 capsule 3   Current Facility-Administered Medications  Medication Dose Route Frequency Provider Last Rate Last Admin   0.9 %  sodium chloride infusion   500 mL Intravenous Once Marlyce Mcdougald V, DO        Allergies as of 12/09/2022 - Review Complete 12/09/2022  Allergen Reaction Noted   Dust mite extract  06/10/2019   Mold extract [trichophyton]  06/10/2019   Tetracyclines & related Other (See Comments) 07/05/2016   Sulfa antibiotics Nausea And Vomiting and Nausea Only 09/29/2013    Family History  Problem Relation Age of Onset   Pancreatic cancer Mother    Atrial fibrillation Father    Dementia Father    Parkinsonism Father    CAD Other    Stroke Neg Hx    Diabetes Mellitus II Neg Hx    Colon cancer Neg Hx    Prostate cancer Neg Hx    Liver disease Neg Hx    Esophageal cancer Neg Hx     Social History   Socioeconomic History   Marital status: Married    Spouse name: Not on file   Number of children: 2   Years of education: Not on file   Highest education level: Not on file  Occupational History   Occupation: self employed    Occupation: Facilities manager, [employer]  Tobacco Use   Smoking status: Never   Smokeless tobacco: Never  Vaping Use   Vaping Use: Never used  Substance and Sexual Activity   Alcohol use: Yes   Drug use: No   Sexual activity: Not on file  Other Topics Concern   Not on file  Social History Narrative   Household: pt and wife   Social Determinants of Health   Financial Resource Strain: Not on file  Food Insecurity: Not on file  Transportation Needs: Not on file  Physical Activity: Not on file  Stress: Not on file  Social Connections: Not on file  Intimate Partner Violence: Not on file    Physical Exam: Vital signs in last 24 hours: @BP  132/75   Pulse (!) 102   Temp 98.1 F (36.7 C) (Temporal)   Ht 6\' 2"  (1.88 m)   Wt 183 lb (83 kg)   SpO2 100%   BMI 23.50 kg/m  GEN: NAD EYE: Sclerae anicteric ENT: MMM CV: Non-tachycardic Pulm: CTA b/l GI: Soft, NT/ND NEURO:  Alert & Oriented x 3   Doristine Locks, DO [facility]   12/09/2022 7:27 AM

## 2022-12-09 NOTE — Patient Instructions (Signed)
YOU HAD AN ENDOSCOPIC PROCEDURE TODAY AT THE [facility]:   Refer to the procedure report that was given to you for any specific questions about what was found during the examination.  If the procedure report does not answer your questions, please call your gastroenterologist to clarify.  If you requested that your care partner not be given the details of your procedure findings, then the procedure report has been included in a sealed envelope for you to review at your convenience later.  **Handouts given on Gastritis, polyps, hemorrhoids and diverticulosis**  YOU SHOULD EXPECT: Some feelings of bloating in the abdomen. Passage of more gas than usual.  Walking can help get rid of the air that was put into your GI tract during the procedure and reduce the bloating. If you had a lower endoscopy (such as a colonoscopy or flexible sigmoidoscopy) you may notice spotting of blood in your stool or on the toilet paper. If you underwent a bowel prep for your procedure, you may not have a normal bowel movement for a few days.  Please Note:  You might notice some irritation and congestion in your nose or some drainage.  This is from the oxygen used during your procedure.  There is no need for concern and it should clear up in a day or so.  SYMPTOMS TO REPORT IMMEDIATELY:  Following lower endoscopy (colonoscopy or flexible sigmoidoscopy):  Excessive amounts of blood in the stool  Significant tenderness or worsening of abdominal pains  Swelling of the abdomen that is new, acute  Fever of 100F or higher  Following upper endoscopy (EGD)  Vomiting of blood or coffee ground material  New chest pain or pain under the shoulder blades  Painful or persistently difficult swallowing  New shortness of breath  Fever of 100F or higher  Black, tarry-looking stools  For urgent or emergent issues, a gastroenterologist can be reached at any hour by calling [phone]. Do not use MyChart messaging for  urgent concerns.    DIET:  We do recommend a small meal at first, but then you may proceed to your regular diet.  Drink plenty of fluids but you should avoid alcoholic beverages for 24 hours.  ACTIVITY:  You should plan to take it easy for the rest of today and you should NOT DRIVE or use heavy machinery until tomorrow (because of the sedation medicines used during the test).    FOLLOW UP: Our staff will call the number listed on your records the next business day following your procedure.  We will call around 7:15- 8:00 am to check on you and address any questions or concerns that you may have regarding the information given to you following your procedure. If we do not reach you, we will leave a message.     If any biopsies were taken you will be contacted by phone or by letter within the next 1-3 weeks.  Please call us at [phone] if you have not heard about the biopsies in 3 weeks.    SIGNATURES/CONFIDENTIALITY: You and/or your care partner have signed paperwork which will be entered into your electronic medical record.  These signatures attest to the fact that that the information above on your After Visit Summary has been reviewed and is understood.  Full responsibility of the confidentiality of this discharge information lies with you and/or your care-partner.

## 2022-12-09 NOTE — Progress Notes (Signed)
Report given to PACU, vss 

## 2022-12-10 ENCOUNTER — Telehealth: Payer: Self-pay

## 2022-12-10 NOTE — Telephone Encounter (Signed)
Follow up call to pt, no answer.  

## 2022-12-10 NOTE — Telephone Encounter (Signed)
PA has been DENIED. No additional information provided at this time.  

## 2022-12-11 MED ORDER — ESOMEPRAZOLE MAGNESIUM 40 MG PO CPDR: DELAYED_RELEASE_CAPSULE | ORAL | 1 refills | Status: DC

## 2022-12-11 NOTE — Addendum Note (Signed)
Addended by: Coletta Memos on: 12/11/2022 04:53 PM   Modules accepted: Orders

## 2022-12-11 NOTE — Telephone Encounter (Signed)
Alternate medications suggested by patient's insurance are lansoprazole, and Esomeprazole, Please advise.

## 2022-12-11 NOTE — Telephone Encounter (Signed)
Esomeprazole 40 mg p.o. sent to patient's pharmacy.

## 2022-12-16 DIAGNOSIS — L570 Actinic keratosis: Secondary | ICD-10-CM | POA: Diagnosis not present

## 2022-12-16 DIAGNOSIS — L578 Other skin changes due to chronic exposure to nonionizing radiation: Secondary | ICD-10-CM | POA: Diagnosis not present

## 2022-12-16 DIAGNOSIS — C44329 Squamous cell carcinoma of skin of other parts of face: Secondary | ICD-10-CM | POA: Diagnosis not present

## 2022-12-16 DIAGNOSIS — C44629 Squamous cell carcinoma of skin of left upper limb, including shoulder: Secondary | ICD-10-CM | POA: Diagnosis not present

## 2022-12-30 ENCOUNTER — Ambulatory Visit (INDEPENDENT_AMBULATORY_CARE_PROVIDER_SITE_OTHER): Payer: BC Managed Care – PPO | Admitting: Internal Medicine

## 2022-12-30 ENCOUNTER — Encounter: Payer: Self-pay | Admitting: Internal Medicine

## 2022-12-30 VITALS — BP 126/78 | HR 86 | Temp 98.0°F | Resp 12 | Ht 74.0 in | Wt 186.0 lb

## 2022-12-30 DIAGNOSIS — E782 Mixed hyperlipidemia: Secondary | ICD-10-CM

## 2022-12-30 DIAGNOSIS — I10 Essential (primary) hypertension: Secondary | ICD-10-CM

## 2022-12-30 DIAGNOSIS — Z23 Encounter for immunization: Secondary | ICD-10-CM

## 2022-12-30 DIAGNOSIS — Z0000 Encounter for general adult medical examination without abnormal findings: Secondary | ICD-10-CM

## 2022-12-30 DIAGNOSIS — Z125 Encounter for screening for malignant neoplasm of prostate: Secondary | ICD-10-CM

## 2022-12-30 DIAGNOSIS — E039 Hypothyroidism, unspecified: Secondary | ICD-10-CM | POA: Diagnosis not present

## 2022-12-30 NOTE — Patient Instructions (Signed)
Check the  blood pressure regularly BP GOAL is between 110/65 and  135/85. If it is consistently higher or lower, let me know      GO TO THE LAB : Get the blood work     GO TO THE FRONT DESK, PLEASE SCHEDULE YOUR APPOINTMENTS Come back for a checkup in 6 months    "Health Care Power of attorney" ,  "Living will" (Advance care planning documents)  If you already have a living will or healthcare power of attorney, is recommended you bring the copy to be scanned in your chart.   The document will be available to all the doctors you see in the system.  Advance care planning is a process that supports adults in  understanding and sharing their preferences regarding future medical care.  The patient's preferences are recorded in documents called Advance Directives and the can be modified at any time while the patient is in full mental capacity.   If you don't have one, please consider create one.      More information at: [email or web address]  

## 2022-12-30 NOTE — Progress Notes (Unsigned)
Subjective:    Patient ID: Peter Spencer, male    DOB: 05/17/1961, 62 y.o.   MRN: [id number]  DOS:  12/30/2022 Type of visit - description: CPX Here for CPX Since the last visit had a EGD and a colonoscopy, results reviewed. Currently with no GI symptoms. No LUTS. He still has some stress but overall better compared to few weeks ago.   Review of Systems See above   Past Medical History:  Diagnosis Date   Acquired hypothyroidism 11/18/2014   Annual physical exam 03/01/2020   Anxiety    Benign essential hypertension 11/18/2014   CAD (coronary artery disease)    CAD in native artery 10/23/2018   Chronic insomnia 11/18/2014   Dupuytren's contracture of left hand 11/25/2019   GERD (gastroesophageal reflux disease)    GERD without esophagitis 10/23/2018   Hyperlipidemia    Hypertension    no meds now   Hypertensive urgency 09/28/2013   Hypothyroidism    LBBB (left bundle branch block) 09/29/2013   Left bundle branch block    OSA (obstructive sleep apnea)    PCP NOTES >>>>>>>>>>>>>> 03/02/2020   Retinal detachment 06/2018   Right   Retinal detachment, right 11/25/2019   SCC (squamous cell carcinoma)    Seasonal allergies    Thrombocytopenia (HCC)--history of, reports extensive w/u (early 2000, ITP?) 11/29/2020   Thyroid disease     Past Surgical History:  Procedure Laterality Date   BASAL CELL CARCINOMA EXCISION N/A 10/30/2020   Procedure: Excision of scalp squamous cell carcinoma with frozen section, application of integra;  Surgeon: Allena Napoleon, MD;  Location: [facility];  Service: Plastics;  Laterality: N/A;  1 hour, please   COLONOSCOPY  01/15/2013   .   COLONOSCOPY  11/27/2015   [facility]. Devona Konig, MD. Diverticulosis of the whole colon, ascending colon and descending colon. Grade 2 internal hemorrhoids. Repeat in 5 years.   DUPUYTREN CONTRACTURE RELEASE Left 05/2016   FASCIECTOMY Left 07/11/2016   Procedure: FASCIECTOMY  left index possible joint release;  Surgeon: Cindee Salt, MD;  Location: [facility];  Service: Orthopedics;  Laterality: Left;  FASCIECTOMY left index possible joint release   KNEE SURGERY     Left eye retna repaired      MOHS SURGERY  2017   NASAL FRACTURE SURGERY     RETINAL DETACHMENT SURGERY Right 06/2018   TOE SURGERY     TONSILLECTOMY      Current Outpatient Medications  Medication Instructions   aspirin EC 81 mg, Oral, Daily   azelastine (ASTELIN) 0.1 % nasal spray 2 sprays, Each Nare, At bedtime PRN, Use in each nostril as directed   esomeprazole (NEXIUM) 40 MG capsule Take 1 Tablet (40 mg) twice daily for 6 weeks, then take 1 tablet )40 mg) daily.   fexofenadine (ALLEGRA) 180 mg, Daily at bedtime   fluorouracil (EFUDEX) 5 % cream Topical   fluticasone (FLONASE) 50 MCG/ACT nasal spray 1 spray, Each Nare, As needed   hydrocortisone 2.5 % lotion 1 application , Topical, As needed   ketoconazole (NIZORAL) 2 % cream APPLY TO AFFECTED AREA TWICE A DAY   levothyroxine (SYNTHROID) 112 mcg, Oral, Daily before breakfast   losartan (COZAAR) 25 mg, Oral, Daily   magnesium oxide (MAG-OX) 400 mg, Oral, Daily   metroNIDAZOLE (METROGEL) 0.75 % gel 1 application , Topical, As needed   Multiple Vitamin (MULTIVITAMIN WITH MINERALS) TABS tablet 1 tablet, Oral, Daily, Unknown stregnth   Omega-3  3,800 mg, Oral, Daily   omeprazole (PRILOSEC) 40 mg, Oral, Daily   pravastatin (PRAVACHOL) 40 mg, Oral, Every evening, Patient needs appointment for further refills. 1 st attempt   Vitamin D 5,000 Units, Oral, Daily   zolpidem (AMBIEN CR) 12.5 mg, Oral, At bedtime PRN, for sleep       Objective:   Physical Exam BP 126/78 (BP Location: Left Arm, Cuff Size: Normal)   Pulse 86   Temp 98 F (36.7 C) (Oral)   Resp 12   Ht 6\' 2"  (1.88 m)   Wt 186 lb (84.4 kg)   SpO2 99%   BMI 23.88 kg/m  General: Well developed, NAD, BMI noted Neck: No  thyromegaly  HEENT:  Normocephalic . Face  symmetric, atraumatic Lungs:  CTA B Normal respiratory effort, no intercostal retractions, no accessory muscle use. Heart: RRR,  no murmur.  Abdomen:  Not distended, soft, non-tender. No rebound or rigidity.   Lower extremities: no pretibial edema bilaterally  Skin: Multiple areas covered at the scalp, upper extremities, sees dermatology regularly and had several lesions excised Neurologic:  alert & oriented X3.  Speech normal, gait appropriate for age and unassisted Strength symmetric and appropriate for age.  Psych: Cognition and judgment appear intact.  Cooperative with normal attention span and concentration.  Behavior appropriate. No anxious or depressed appearing.     Assessment    ASSESSMENT (new patient 11/2019) HTN, history of hypertensive urgency High cholesterol Thyroid disease CAD, LBBB (+ LAD disease per coronary CT, neg a stress test 2017) OSA - borderline per pt  Dupuytren's Right retinal detachment 2019 BCC Rosacea dx ~ 2017, seen @ [place] derm, increased LFTs w/ tetracycline per chart review Retinal detachment , surgery R 22019, L 05-2020 Thrombocytopenia: w/u in [facility] ~ 1990s, early 2000s, including a BM Bx , ITP ?  PLAN Here for CPX    Td booster today  S/p Shingrix Had a RSV per pt  Rec: covid booster, flu shot, PNM 20  CCS: Had a colonoscopy 2017, 5 years.  Colonoscopy 12/09/2022, Sessile serrated polyp, next 3 years. Prostate cancer screening: DRE completely normal today, checking a PSA, UA, urine culture. Labs:  BMP FLP PSA Healthcare POA: See AVS. Diet exercise: Has not been able to exercise much due to dermatological procedures but he is ready to go back on the gym. HTN: BP is very good today, on losartan.  Check BMP High cholesterol: On Pravachol, not fasting, will check a lipid profile. Thyroid disease: On Synthroid, recent TSH H WNL.Marland Kitchen CAD: Asymptomatic Stress: PHQ-9: 9, GAD-7: 6.  GI: Had a EGD and colonoscopy, no H. pylori, sessile serrated  polyp, next colonoscopy 3 years. RTC 6 months. ------ 4/25 HTN: Seems well-controlled, recommend to check ambulatory BPs at home, last BMP satisfactory.  RF losartan. High cholesterol: Refilled Pravachol, last FLP satisfactory. Hypothyroidism: Check TSH, refill 90 days with results. Stress: Reports that has been under significant amount of stress, family related, did not like to go into details. Insomnia: RF Ambien, needs 90-day supply, PDMP reviewed. CAD: Asymptomatic.  Recommend to see cardiology. OSA: Reports she uses a dental device and is doing well. Skin cancer: Since LOV, saw dermatology, Dx with SCC, to have surgery including mohs on the scalp. GI: Was seen by Efraim Kaufmann also w/ abdominal pain on January, abd CT and labs were okay.  Saw GI, they are planning a colonoscopy and EGD due to significant heartburn and dyspepsia. RTC: Scheduled for June.  Recommend to keep the appointment with  me.

## 2022-12-31 ENCOUNTER — Encounter: Payer: Self-pay | Admitting: Internal Medicine

## 2022-12-31 LAB — BASIC METABOLIC PANEL
BUN: 22 mg/dL (ref 6–23)
CO2: 30 mEq/L (ref 19–32)
Calcium: 8.5 mg/dL (ref 8.4–10.5)
Chloride: 98 mEq/L (ref 96–112)
Creatinine, Ser: 0.9 mg/dL (ref 0.40–1.50)
GFR: 90.35 mL/min (ref >60.00)
Glucose, Bld: 82 mg/dL (ref 70–99)
Potassium: 4.2 mEq/L (ref 3.5–5.1)
Sodium: 135 mEq/L (ref 135–145)

## 2022-12-31 LAB — LIPID PANEL
Cholesterol: 146 mg/dL (ref 0–200)
HDL: 39.8 mg/dL (ref >39.00)
LDL Cholesterol: 84 mg/dL (ref 0–99)
NonHDL: 105.97
Total CHOL/HDL Ratio: 4
Triglycerides: 110 mg/dL (ref 0.0–149.0)
VLDL: 22 mg/dL (ref 0.0–40.0)

## 2022-12-31 LAB — PSA: PSA: 0.59 ng/mL (ref 0.10–4.00)

## 2022-12-31 NOTE — Assessment & Plan Note (Signed)
Here for CPX    HTN: BP is very good today, on losartan.  Check BMP High cholesterol: On Pravachol, not fasting, will check a lipid profile. Thyroid disease: On Synthroid, recent TSH H WNL.Marland Kitchen CAD: Asymptomatic Stress: PHQ-9: 9, GAD-7: 6. GI: Had a EGD and colonoscopy, no H. pylori, sessile serrated polyp, next colonoscopy 3 years. RTC 6 months.

## 2022-12-31 NOTE — Assessment & Plan Note (Signed)
Td booster today  S/p Shingrix Had a RSV per pt  Rec: covid booster, flu shot, PNM 20  CCS: Had a colonoscopy 2017, 5 years.  Colonoscopy 12/09/2022, Sessile serrated polyp, next 3 years. Prostate cancer screening: no sxs , check a PSA  Labs:  BMP FLP PSA Healthcare POA: See AVS. Diet exercise: Has not been able to exercise much due to dermatological procedures but he is ready to go back on the gym.

## 2023-01-08 DIAGNOSIS — F4322 Adjustment disorder with anxiety: Secondary | ICD-10-CM | POA: Diagnosis not present

## 2023-01-09 ENCOUNTER — Telehealth: Payer: Self-pay | Admitting: *Deleted

## 2023-01-09 NOTE — Telephone Encounter (Signed)
Patient called surgery scheduling line about several missed calls from a [place] @ [phone]. States he called that number back today and was told to call us. Upon checking for telephone notes I did not see any notes from a Nicholaus Bloom about call attempts. It may be that I have limited referral access if there is documentation there about call attempts because I did not see anything on the referral either. He mentioned [facility] so I gave him the number to [facility] where he is an active patient.  Reported incident to Research officer, political party as well, and asked our in house referrals person Franklyn Lor to please attempt to reach the patient about the referral in EPIC.

## 2023-01-09 NOTE — Telephone Encounter (Signed)
Upon further investigate, I learned the communication notes were in the referral itself which I have never been trained on. Our referrals team has been trying to reach the patient without success and have documented each time on them. Updated Admin as well.

## 2023-01-16 DIAGNOSIS — F4322 Adjustment disorder with anxiety: Secondary | ICD-10-CM | POA: Diagnosis not present

## 2023-01-23 DIAGNOSIS — F4322 Adjustment disorder with anxiety: Secondary | ICD-10-CM | POA: Diagnosis not present

## 2023-02-04 DIAGNOSIS — F4322 Adjustment disorder with anxiety: Secondary | ICD-10-CM | POA: Diagnosis not present

## 2023-02-11 DIAGNOSIS — C4442 Squamous cell carcinoma of skin of scalp and neck: Secondary | ICD-10-CM | POA: Diagnosis not present

## 2023-02-13 DIAGNOSIS — F4322 Adjustment disorder with anxiety: Secondary | ICD-10-CM | POA: Diagnosis not present

## 2023-02-20 ENCOUNTER — Other Ambulatory Visit: Payer: Self-pay | Admitting: Internal Medicine

## 2023-02-26 DIAGNOSIS — F4322 Adjustment disorder with anxiety: Secondary | ICD-10-CM | POA: Diagnosis not present

## 2023-02-27 DIAGNOSIS — L578 Other skin changes due to chronic exposure to nonionizing radiation: Secondary | ICD-10-CM | POA: Diagnosis not present

## 2023-02-27 DIAGNOSIS — L821 Other seborrheic keratosis: Secondary | ICD-10-CM | POA: Diagnosis not present

## 2023-02-27 DIAGNOSIS — D225 Melanocytic nevi of trunk: Secondary | ICD-10-CM | POA: Diagnosis not present

## 2023-02-27 DIAGNOSIS — D492 Neoplasm of unspecified behavior of bone, soft tissue, and skin: Secondary | ICD-10-CM | POA: Diagnosis not present

## 2023-02-27 DIAGNOSIS — C44722 Squamous cell carcinoma of skin of right lower limb, including hip: Secondary | ICD-10-CM | POA: Diagnosis not present

## 2023-02-27 DIAGNOSIS — L814 Other melanin hyperpigmentation: Secondary | ICD-10-CM | POA: Diagnosis not present

## 2023-03-01 ENCOUNTER — Encounter (HOSPITAL_BASED_OUTPATIENT_CLINIC_OR_DEPARTMENT_OTHER): Payer: Self-pay | Admitting: Emergency Medicine

## 2023-03-01 ENCOUNTER — Emergency Department (HOSPITAL_BASED_OUTPATIENT_CLINIC_OR_DEPARTMENT_OTHER)
Admission: EM | Admit: 2023-03-01 | Discharge: 2023-03-01 | Disposition: A | Discharge status: Home / Self Care | Payer: BC Managed Care – PPO | Source: Home / Self Care | Attending: Emergency Medicine | Admitting: Emergency Medicine

## 2023-03-01 ENCOUNTER — Other Ambulatory Visit: Payer: Self-pay

## 2023-03-01 ENCOUNTER — Emergency Department (HOSPITAL_BASED_OUTPATIENT_CLINIC_OR_DEPARTMENT_OTHER): Payer: BC Managed Care – PPO

## 2023-03-01 ENCOUNTER — Emergency Department (HOSPITAL_BASED_OUTPATIENT_CLINIC_OR_DEPARTMENT_OTHER): Payer: BC Managed Care – PPO | Admitting: Radiology

## 2023-03-01 DIAGNOSIS — I6521 Occlusion and stenosis of right carotid artery: Secondary | ICD-10-CM | POA: Diagnosis not present

## 2023-03-01 DIAGNOSIS — I672 Cerebral atherosclerosis: Secondary | ICD-10-CM | POA: Diagnosis not present

## 2023-03-01 DIAGNOSIS — R079 Chest pain, unspecified: Secondary | ICD-10-CM | POA: Insufficient documentation

## 2023-03-01 DIAGNOSIS — R519 Headache, unspecified: Secondary | ICD-10-CM | POA: Diagnosis not present

## 2023-03-01 DIAGNOSIS — R918 Other nonspecific abnormal finding of lung field: Secondary | ICD-10-CM | POA: Diagnosis not present

## 2023-03-01 DIAGNOSIS — R0789 Other chest pain: Secondary | ICD-10-CM | POA: Diagnosis not present

## 2023-03-01 DIAGNOSIS — R202 Paresthesia of skin: Secondary | ICD-10-CM | POA: Diagnosis not present

## 2023-03-01 DIAGNOSIS — I6503 Occlusion and stenosis of bilateral vertebral arteries: Secondary | ICD-10-CM | POA: Diagnosis not present

## 2023-03-01 LAB — BASIC METABOLIC PANEL
Anion gap: 9 (ref 5–15)
BUN: 24 mg/dL — ABNORMAL HIGH (ref 8–23)
CO2: 25 mmol/L (ref 22–32)
Calcium: 8.5 mg/dL — ABNORMAL LOW (ref 8.9–10.3)
Chloride: 98 mmol/L (ref 98–111)
Creatinine, Ser: 1 mg/dL (ref 0.61–1.24)
GFR, Estimated: >60 mL/min (ref >60)
Glucose, Bld: 96 mg/dL (ref 70–99)
Potassium: 4.1 mmol/L (ref 3.5–5.1)
Sodium: 132 mmol/L — ABNORMAL LOW (ref 135–145)

## 2023-03-01 LAB — CBC
HCT: 38.2 % — ABNORMAL LOW (ref 39.0–52.0)
Hemoglobin: 13.7 g/dL (ref 13.0–17.0)
MCH: 32.3 pg (ref 26.0–34.0)
MCHC: 35.9 g/dL (ref 30.0–36.0)
MCV: 90.1 fL (ref 80.0–100.0)
Platelets: 112 10*3/uL — ABNORMAL LOW (ref 150–400)
RBC: 4.24 MIL/uL (ref 4.22–5.81)
RDW: 12.7 % (ref 11.5–15.5)
WBC: 4.3 10*3/uL (ref 4.0–10.5)
nRBC: 0 % (ref 0.0–0.2)

## 2023-03-01 LAB — TROPONIN I (HIGH SENSITIVITY): Troponin I (High Sensitivity): 2 ng/L (ref <18)

## 2023-03-01 MED ORDER — KETOROLAC TROMETHAMINE 15 MG/ML IJ SOLN
15.0000 mg | Freq: Once | INTRAMUSCULAR | Status: AC
  Administered 2023-03-01: 15 mg via INTRAVENOUS
  Filled 2023-03-01: qty 1

## 2023-03-01 MED ORDER — IOHEXOL 350 MG/ML SOLN
100.0000 mL | Freq: Once | INTRAVENOUS | Status: AC | PRN
  Administered 2023-03-01: 75 mL via INTRAVENOUS

## 2023-03-01 MED ORDER — PROCHLORPERAZINE EDISYLATE 10 MG/2ML IJ SOLN
5.0000 mg | Freq: Once | INTRAMUSCULAR | Status: AC
  Administered 2023-03-01: 5 mg via INTRAVENOUS
  Filled 2023-03-01: qty 2

## 2023-03-01 NOTE — ED Notes (Signed)
Pt d/c home per MD order. Discharge summary reviewed with pt, pt verbalizes understanding. Ambulatory off unit. Discharged home with spouse. No s/s of acute distress noted

## 2023-03-01 NOTE — ED Notes (Signed)
Pt transported to CT ?

## 2023-03-01 NOTE — ED Triage Notes (Signed)
Pt states wasn't feeling well yesterday, woke up today, left arm feeling tingly. Tuesday night had a migraine event ( new), although patient states he was dx in January by ENT with chronic migraine?, pt pointing to his throat and upper chest soreness.

## 2023-03-01 NOTE — ED Provider Notes (Signed)
[place] EMER[facility]ENC[facility] DEPARTMENT AT [facility] Provider Note   CSN: [id number] Arrival date & time: 03/01/23  1526     History  Chief Complaint  Patient presents with   Chest Pain    Peter Spencer is a 62 y.o. male.   Chest Pain Patient has felt bad for a few days.  Feeling off since Tuesday.  Headache.  Some tightness in his throat.  Does have a history of headaches.  Previously diagnosed with migraines by ENT.  States his headache is more severe.  Does have some tingling in his left arm/hand.  Particularly on the radial aspect of the hand.  No new weakness.  States he just feels tingly.  Also some tightness in his upper chest.  Does have a history of reflux.  Does have a history of coronary artery disease.  No vision changes.  States he did have a chiropractic adjustment of his neck yesterday.  States it was done because his massage therapist told him it had not been done in a while.  Has not had tingling after adjustments in the past.     Home Medications Prior to Admission medications   Medication Sig Start Date End Date Taking? Authorizing Provider  aspirin EC 81 MG tablet Take 81 mg by mouth daily.    [provider]  azelastine (ASTELIN) 0.1 % nasal spray Place 2 sprays into both nostrils at bedtime as needed for rhinitis or allergies. Use in each nostril as directed 12/20/19   Wanda Plump, MD  Cholecalciferol (VITAMIN D) 125 MCG (5000 UT) CAPS Take 5,000 Units by mouth daily.    [provider]  esomeprazole (NEXIUM) 40 MG capsule Take 1 Tablet (40 mg) twice daily for 6 weeks, then take 1 tablet )40 mg) daily. 12/11/22   Cirigliano, Vito V, DO  fexofenadine (ALLEGRA) 180 MG tablet Take 180 mg by mouth at bedtime.    [provider]  fluorouracil (EFUDEX) 5 % cream Apply topically. 10/01/21   [provider]  fluticasone (FLONASE) 50 MCG/ACT nasal spray Place 1 spray into both nostrils as needed for allergies or rhinitis.     [provider]  hydrocortisone 2.5 % lotion Apply 1 application topically as needed (rocesea). 09/04/20   [provider]  ketoconazole (NIZORAL) 2 % cream APPLY TO AFFECTED AREA TWICE A DAY 11/18/22   [provider]  levothyroxine (SYNTHROID) 112 MCG tablet Take 1 tablet (112 mcg total) by mouth daily before breakfast. 02/20/23   Wanda Plump, MD  losartan (COZAAR) 25 MG tablet Take 1 tablet (25 mg total) by mouth daily. 11/20/22   Wanda Plump, MD  magnesium oxide (MAG-OX) 400 MG tablet Take 400 mg by mouth daily.    [provider]  metroNIDAZOLE (METROGEL) 0.75 % gel Apply 1 application. topically as needed (rash). 06/07/20   [provider]  Multiple Vitamin (MULTIVITAMIN WITH MINERALS) TABS tablet Take 1 tablet by mouth daily. Unknown stregnth    [provider]  Omega-3 1000 MG CAPS Take 3,800 mg by mouth daily.    [provider]  pravastatin (PRAVACHOL) 40 MG tablet Take 1 tablet (40 mg total) by mouth every evening. Patient needs appointment for further refills. 1 st attempt 11/20/22   Wanda Plump, MD  zolpidem (AMBIEN CR) 12.5 MG CR tablet Take 1 tablet (12.5 mg total) by mouth at bedtime as needed. for sleep 11/20/22   Wanda Plump, MD      Allergies  Dust mite extract, Mold extract [trichophyton], Tetracyclines & related, and Sulfa antibiotics    Review of Systems   Review of Systems  Cardiovascular:  Positive for chest pain.    Physical Exam Updated Vital Signs BP (!) 169/94   Pulse 86   Temp 98.4 F (36.9 C) (Oral)   Resp 15   SpO2 99%  Physical Exam Vitals and nursing note reviewed.  HENT:     Head: Atraumatic.  Neck:     Comments: Some tenderness to musculature over lower neck posteriorly. Pulmonary:     Breath sounds: No decreased breath sounds.  Chest:     Chest wall: Tenderness present.     Comments: Some tenderness to upper chest. Musculoskeletal:     Cervical back: Neck supple.     Right lower  leg: No edema.     Left lower leg: No edema.  Neurological:     Mental Status: He is alert.     ED Results / Procedures / Treatments   Labs (all labs ordered are listed, but only abnormal results are displayed) Labs Reviewed  CBC  BASIC METABOLIC PANEL  TROPONIN I (HIGH SENSITIVITY)    EKG None  Radiology No results found.  Procedures Procedures    Medications Ordered in ED Medications  prochlorperazine (COMPAZINE) injection 5 mg (has no administration in time range)  ketorolac (TORADOL) 15 MG/ML injection 15 mg (has no administration in time range)    ED Course/ Medical Decision Making/ A&P                                 Medical Decision Making Amount and/or Complexity of Data Reviewed Labs: ordered. Radiology: ordered.  Risk Prescription drug management.   Patient with various complaints.  Headache.  Tingling of left arm.  Chest pain.  Feeling bad.  Differential diagnosis is long but includes coronary artery disease, migraine, intracranial mass.  Has previously been diagnosed with migraine but has not had any imaging done.  Will get head CT to evaluate.  Has only some paresthesias particular over the radial aspect of his hand but states at times will do the whole hand.  However did have chiropractic adjustment yesterday which does increase risk for stroke.  Will get CT angiography of head and neck to evaluate for possible causes of both the headache and tingling.  Stroke is felt somewhat less likely however.  Will also treat with Toradol and Compazine treatment possible migraine/complicated migraine.  Also get EKG and chest x-ray along with troponin evaluate for cardiac cause.  Aortic dissection felt less likely.  CTA reassuring overall.  No acute large vessel findings.  Does have some smaller narrowings.  Patient's headache is 40% improved but states that the tingling in his left arm has resolved.  This does make cause such as complicated migraine more likely.  Mild  hypertension here.  Chest pain overall reassuring.  Troponin negative.  I think patient is stable for discharge home.  Will have follow-up PCP and neurology.  Doubt acute stroke.  Do not think we need MRI at this time.  Discussed with patient and his wife.        Final Clinical Impression(s) / ED Diagnoses Final diagnoses:  None    Rx / DC Orders ED Discharge Orders     None         Benjiman Core, MD 03/01/23 1751

## 2023-03-06 DIAGNOSIS — F4322 Adjustment disorder with anxiety: Secondary | ICD-10-CM | POA: Diagnosis not present

## 2023-03-14 ENCOUNTER — Ambulatory Visit: Payer: BC Managed Care – PPO | Attending: Cardiology | Admitting: Cardiology

## 2023-03-14 VITALS — BP 122/84 | HR 88 | Ht 74.0 in | Wt 184.0 lb

## 2023-03-14 DIAGNOSIS — I10 Essential (primary) hypertension: Secondary | ICD-10-CM

## 2023-03-14 DIAGNOSIS — I447 Left bundle-branch block, unspecified: Secondary | ICD-10-CM

## 2023-03-14 DIAGNOSIS — I2510 Atherosclerotic heart disease of native coronary artery without angina pectoris: Secondary | ICD-10-CM

## 2023-03-14 DIAGNOSIS — E782 Mixed hyperlipidemia: Secondary | ICD-10-CM

## 2023-03-14 MED ORDER — LOSARTAN POTASSIUM 25 MG PO TABS: 25.0000 mg | ORAL_TABLET | Freq: Every day | ORAL | 3 refills | Status: DC

## 2023-03-14 NOTE — Progress Notes (Signed)
Cardiology Office Note:    Date:  03/14/2023   ID:  Peter Spencer, DOB 05/16/1961, MRN [id number]  PCP:  Peter Plump, MD  Cardiologist:  Peter Balsam, MD    Referring MD: Peter Plump, MD   Chief Complaint  Patient presents with   Follow-up    History of Present Illness:    Peter Spencer is a 62 y.o. male  with past medical history significant for coronary artery disease, few years ago he had coronary CT angio which showed moderate stenosis of LAD. In 2017 he end up having stress test which was negative. He does have baseline left bundle branch block, also essential hypertension, dyslipidemia. He is ejection fraction is 40 to 45% which I will dissipate in somebody with left bundle branch block. He comes today after 1 year follow-up. He is doing very well. He participate in program that manage his stress exercises as well as diet which was basically low fat low cholesterol diet.  Comes today to months for follow-up overall seems to be doing well.  No chest pain tightness squeezing pressure burning chest.  He described the fact that in the end of last year there was a very stressful situation with the family.  He stopped exercising on the regular basis but now for about a month he started getting back to the routine.  Recently was in the hospital with migraine headaches, CT angio of the neck has been performed at mild disease of cardiac arteries and vertebral arteries.  The key will be to modify his risk factors.  Denies have any typical chest pain tightness squeezing pressure burning chest  Past Medical History:  Diagnosis Date   Acquired hypothyroidism 11/18/2014   Annual physical exam 03/01/2020   Anxiety    Benign essential hypertension 11/18/2014   CAD (coronary artery disease)    CAD in native artery 10/23/2018   Chronic insomnia 11/18/2014   Dupuytren's contracture of left hand 11/25/2019   GERD (gastroesophageal reflux disease)    GERD without esophagitis  10/23/2018   Hyperlipidemia    Hypertension    no meds now   Hypertensive urgency 09/28/2013   Hypothyroidism    LBBB (left bundle branch block) 09/29/2013   Left bundle branch block    OSA (obstructive sleep apnea)    PCP NOTES >>>>>>>>>>>>>> 03/02/2020   Retinal detachment 06/2018   Right   Retinal detachment, right 11/25/2019   SCC (squamous cell carcinoma)    Seasonal allergies    Thrombocytopenia (HCC)--history of, reports extensive w/u (early 2000, ITP?) 11/29/2020   Thyroid disease     Past Surgical History:  Procedure Laterality Date   BASAL CELL CARCINOMA EXCISION N/A 10/30/2020   Procedure: Excision of scalp squamous cell carcinoma with frozen section, application of integra;  Surgeon: Peter Napoleon, MD;  Location: [facility];  Service: Plastics;  Laterality: N/A;  1 hour, please   COLONOSCOPY  01/15/2013   .   COLONOSCOPY  11/27/2015   [facility]. Peter Konig, MD. Diverticulosis of the whole colon, ascending colon and descending colon. Grade 2 internal hemorrhoids. Repeat in 5 years.   DUPUYTREN CONTRACTURE RELEASE Left 05/2016   FASCIECTOMY Left 07/11/2016   Procedure: FASCIECTOMY left index possible joint release;  Surgeon: Peter Salt, MD;  Location: [facility];  Service: Orthopedics;  Laterality: Left;  FASCIECTOMY left index possible joint release   KNEE SURGERY     Left eye retna repaired  MOHS SURGERY  2017   NASAL FRACTURE SURGERY     RETINAL DETACHMENT SURGERY Right 06/2018   TOE SURGERY     TONSILLECTOMY      Current Medications: Current Meds  Medication Sig   aspirin EC 81 MG tablet Take 81 mg by mouth daily.   azelastine (ASTELIN) 0.1 % nasal spray Place 2 sprays into both nostrils at bedtime as needed for rhinitis or allergies. Use in each nostril as directed   Cholecalciferol (VITAMIN D) 125 MCG (5000 UT) CAPS Take 5,000 Units by mouth daily.   esomeprazole (NEXIUM) 40 MG capsule Take 1 Tablet (40 mg)  twice daily for 6 weeks, then take 1 tablet )40 mg) daily. (Patient taking differently: Take 40 mg by mouth 2 (two) times daily before a meal. Take 1 Tablet (40 mg) twice daily for 6 weeks, then take 1 tablet )40 mg) daily.)   fexofenadine (ALLEGRA) 180 MG tablet Take 180 mg by mouth at bedtime.   fluorouracil (EFUDEX) 5 % cream Apply 1 Application topically daily.   fluticasone (FLONASE) 50 MCG/ACT nasal spray Place 1 spray into both nostrils as needed for allergies or rhinitis.   hydrocortisone 2.5 % lotion Apply 1 application topically as needed (rocesea).   ketoconazole (NIZORAL) 2 % cream Apply 1 Application topically daily.   levothyroxine (SYNTHROID) 112 MCG tablet Take 1 tablet (112 mcg total) by mouth daily before breakfast.   losartan (COZAAR) 25 MG tablet Take 1 tablet (25 mg total) by mouth daily.   magnesium oxide (MAG-OX) 400 MG tablet Take 400 mg by mouth daily.   metroNIDAZOLE (METROGEL) 0.75 % gel Apply 1 application. topically as needed (rash).   Multiple Vitamin (MULTIVITAMIN WITH MINERALS) TABS tablet Take 1 tablet by mouth daily. Unknown stregnth   Omega-3 1000 MG CAPS Take 3,800 mg by mouth daily.   pravastatin (PRAVACHOL) 40 MG tablet Take 1 tablet (40 mg total) by mouth every evening. Patient needs appointment for further refills. 1 st attempt   zolpidem (AMBIEN CR) 12.5 MG CR tablet Take 1 tablet (12.5 mg total) by mouth at bedtime as needed. for sleep (Patient taking differently: Take 12.5 mg by mouth at bedtime as needed for sleep. for sleep)     Allergies:   Dust mite extract, Mold extract [trichophyton], Tetracyclines & related, and Sulfa antibiotics   Social History   Socioeconomic History   Marital status: Married    Spouse name: Not on file   Number of children: 2   Years of education: Not on file   Highest education level: Not on file  Occupational History   Occupation: self employed    Occupation: Facilities manager, [employer]  Tobacco Use   Smoking status: Never    Smokeless tobacco: Never  Vaping Use   Vaping status: Never Used  Substance and Sexual Activity   Alcohol use: Yes   Drug use: No   Sexual activity: Not on file  Other Topics Concern   Not on file  Social History Narrative   Household: pt and wife   Social Determinants of Health   Financial Resource Strain: Low Risk  (03/03/2019)   Received from [facility] visits prior to 09/28/2022., [facility] visits prior to 09/28/2022.   Overall Financial Resource Strain (CARDIA)    Difficulty of Paying Living Expenses: Not hard at all  Food Insecurity: No Food Insecurity (03/03/2019)   Received from [facility] visits prior to 09/28/2022., [facility] visits prior to 09/28/2022.   Hunger Vital Sign    Worried About Running Out of Food in the Last Year: Never true    Ran Out of Food in the Last Year: Never true  Transportation Needs: No Transportation Needs (03/03/2019)   Received from [facility] visits prior to 09/28/2022., [facility] visits prior to 09/28/2022.   PRAPARE - Administrator, Civil Service (Medical): No    Lack of Transportation (Non-Medical): No  Physical Activity: Not on file  Stress: No Stress Concern Present (03/03/2019)   [facility] visits prior to 09/28/2022., [facility] visits prior to 09/28/2022.   [employer] of Occupational Health - Occupational Stress Questionnaire    Feeling of Stress : Not at all  Social Connections: Unknown (12/11/2021)   Received from [facility]   Social Network    Social Network: Not on file     Family History: The patient's family history includes Atrial fibrillation in his father; CAD in an other family member; Dementia in his father; Pancreatic cancer in his mother; Parkinsonism in his father. There is no history of Stroke, Diabetes  Mellitus II, Colon cancer, Prostate cancer, Liver disease, or Esophageal cancer. ROS:   Please see the history of present illness.    All 14 point review of systems negative except as described per history of present illness  EKGs/Labs/Other Studies Reviewed:    EKG Interpretation Date/Time:  Friday March 14 2023 09:04:49 EDT Ventricular Rate:  83 PR Interval:  182 QRS Duration:  150 QT Interval:  408 QTC Calculation: 479 R Axis:   50  Text Interpretation: Normal sinus rhythm Left bundle branch block When compared with ECG of 01-Mar-2023 15:34, PREVIOUS ECG IS PRESENT Confirmed by Peter Spencer [phone]) on 03/14/2023 9:27:04 AM    Recent Labs: 08/27/2022: ALT 16 11/20/2022: TSH 2.51 03/01/2023: BUN 24; Creatinine, Ser 1.00; Hemoglobin 13.7; Platelets 112; Potassium 4.1; Sodium 132  Recent Lipid Panel    Component Value Date/Time   CHOL 146 12/30/2022 1439   CHOL 151 02/15/2022 0855   TRIG 110.0 12/30/2022 1439   HDL 39.80 12/30/2022 1439   HDL 54 02/15/2022 0855   CHOLHDL 4 12/30/2022 1439   VLDL 22.0 12/30/2022 1439   LDLCALC 84 12/30/2022 1439   LDLCALC 85 02/15/2022 0855    Physical Exam:    VS:  BP 122/84 (BP Location: Left Arm, Patient Position: Sitting)   Pulse 88   Ht 6\' 2"  (1.88 m)   Wt 184 lb (83.5 kg)   SpO2 98%   BMI 23.62 kg/m     Wt Readings from Last 3 Encounters:  03/14/23 184 lb (83.5 kg)  12/30/22 186 lb (84.4 kg)  12/09/22 183 lb (83 kg)     GEN:  Well nourished, well developed in no acute distress HEENT: Normal NECK: No JVD; No carotid bruits LYMPHATICS: No lymphadenopathy CARDIAC: RRR, no murmurs, no rubs, no gallops RESPIRATORY:  Clear to auscultation without rales, wheezing or rhonchi  ABDOMEN: Soft, non-tender, non-distended MUSCULOSKELETAL:  No edema; No deformity  SKIN: Warm and dry LOWER EXTREMITIES: no swelling NEUROLOGIC:  Alert and oriented x 3 PSYCHIATRIC:  Normal affect   ASSESSMENT:    1. Benign essential hypertension    2. CAD in native artery   3. LBBB (left bundle branch block)   4. Mixed hyperlipidemia    PLAN:    In order  of problems listed above:  Benign essential hypertension blood pressure well-controlled continue present management. Dyslipidemia I did review K PN which show me 84 LDL and 39 HDL.  He is on pravastatin 40 but he is determined to go back to routine exercises daily will recheck his fasting lipid profile in about 2 months. Left bundle branch block, chronic will do echocardiogram. Coronary disease stable on appropriate medications.   Medication Adjustments/Labs and Tests Ordered: Current medicines are reviewed at length with the patient today.  Concerns regarding medicines are outlined above.  Orders Placed This Encounter  Procedures   EKG 12-Lead   Medication changes: No orders of the defined types were placed in this encounter.   Signed, Georgeanna Lea, MD, [facility] 03/14/2023 9:54 AM    [facility]

## 2023-03-14 NOTE — Addendum Note (Signed)
Addended by: Baldo Ash D on: 03/14/2023 10:07 AM   Modules accepted: Orders

## 2023-03-14 NOTE — Patient Instructions (Addendum)
Medication Instructions:  Your physician recommends that you continue on your current medications as directed. Please refer to the Current Medication list given to you today.  *If you need a refill on your cardiac medications before your next appointment, please call your pharmacy*   Lab Work: 3rd Floor    [address]   Your physician recommends that you return for lab work in:   6 weeks  You need to have labs done when you are fasting.  You can come Monday through Friday 8:00 am to 11:30 am and 1:00 to 4:00. You do not need to make an appointment as the order has already been placed. The labs you are going to have done are Lipid Panel, AST, ALT      Testing/Procedures: Your physician has requested that you have an echocardiogram. Echocardiography is a painless test that uses sound waves to create images of your heart. It provides your doctor with information about the size and shape of your heart and how well your heart's chambers and valves are working. This procedure takes approximately one hour. There are no restrictions for this procedure. Please do NOT wear cologne, perfume, aftershave, or lotions (deodorant is allowed). Please arrive 15 minutes prior to your appointment time.    Follow-Up: At [facility], you and your health needs are our priority.  As part of our continuing mission to provide you with exceptional heart care, we have created designated Provider Care Teams.  These Care Teams include your primary Cardiologist (physician) and Advanced Practice Providers (APPs -  Physician Assistants and Nurse Practitioners) who all work together to provide you with the care you need, when you need it.  We recommend signing up for the patient portal called "MyChart".  Sign up information is provided on this After Visit Summary.  MyChart is used to connect with patients for Virtual Visits (Telemedicine).  Patients are able to view lab/test results, encounter notes, upcoming appointments,  etc.  Non-urgent messages can be sent to your provider as well.   To learn more about what you can do with MyChart, go to [email or web address].    Your next appointment:   6 month(s)  The format for your next appointment:   In Person  Provider:   Gypsy Balsam, MD    Other Instructions NA

## 2023-03-17 DIAGNOSIS — F4322 Adjustment disorder with anxiety: Secondary | ICD-10-CM | POA: Diagnosis not present

## 2023-03-27 DIAGNOSIS — F4322 Adjustment disorder with anxiety: Secondary | ICD-10-CM | POA: Diagnosis not present

## 2023-04-07 DIAGNOSIS — F4322 Adjustment disorder with anxiety: Secondary | ICD-10-CM | POA: Diagnosis not present

## 2023-04-10 ENCOUNTER — Other Ambulatory Visit: Payer: Self-pay | Admitting: Cardiology

## 2023-04-10 DIAGNOSIS — I447 Left bundle-branch block, unspecified: Secondary | ICD-10-CM

## 2023-04-10 DIAGNOSIS — I2510 Atherosclerotic heart disease of native coronary artery without angina pectoris: Secondary | ICD-10-CM

## 2023-04-10 DIAGNOSIS — E782 Mixed hyperlipidemia: Secondary | ICD-10-CM

## 2023-04-10 DIAGNOSIS — I10 Essential (primary) hypertension: Secondary | ICD-10-CM

## 2023-04-10 DIAGNOSIS — R0600 Dyspnea, unspecified: Secondary | ICD-10-CM

## 2023-04-17 ENCOUNTER — Other Ambulatory Visit: Payer: Self-pay | Admitting: Gastroenterology

## 2023-04-24 DIAGNOSIS — F4322 Adjustment disorder with anxiety: Secondary | ICD-10-CM | POA: Diagnosis not present

## 2023-05-01 ENCOUNTER — Ambulatory Visit (HOSPITAL_BASED_OUTPATIENT_CLINIC_OR_DEPARTMENT_OTHER): Payer: BC Managed Care – PPO

## 2023-05-08 DIAGNOSIS — F4322 Adjustment disorder with anxiety: Secondary | ICD-10-CM | POA: Diagnosis not present

## 2023-05-19 DIAGNOSIS — J302 Other seasonal allergic rhinitis: Secondary | ICD-10-CM | POA: Diagnosis not present

## 2023-05-24 DIAGNOSIS — J0100 Acute maxillary sinusitis, unspecified: Secondary | ICD-10-CM | POA: Diagnosis not present

## 2023-05-30 ENCOUNTER — Telehealth: Payer: Self-pay | Admitting: Internal Medicine

## 2023-05-30 NOTE — Telephone Encounter (Signed)
Dx--- Chronic insomnia, PDMP okay reviewed, prescription sent

## 2023-05-30 NOTE — Telephone Encounter (Signed)
Requesting: Ambien CR 12.5mg   Contract: 11/20/22 UDS: 11/20/22 Last Visit: 12/30/22 Next Visit:  None Last Refill:  11/20/22 #90 and 1RF   Please Advise

## 2023-06-13 ENCOUNTER — Ambulatory Visit: Payer: BC Managed Care – PPO | Admitting: Physician Assistant

## 2023-06-13 VITALS — BP 134/74 | HR 87 | Temp 98.0°F

## 2023-06-13 DIAGNOSIS — J0110 Acute frontal sinusitis, unspecified: Secondary | ICD-10-CM

## 2023-06-13 DIAGNOSIS — H6503 Acute serous otitis media, bilateral: Secondary | ICD-10-CM

## 2023-06-13 MED ORDER — AMOXICILLIN-POT CLAVULANATE 875-125 MG PO TABS
1.0000 | ORAL_TABLET | Freq: Two times a day (BID) | ORAL | 0 refills | Status: AC
Start: 2023-06-13 — End: 2023-06-20

## 2023-06-13 NOTE — Patient Instructions (Signed)
Use nasal lavage twice daily Use flonase in the AM and azelastine in the PM

## 2023-06-13 NOTE — Progress Notes (Unsigned)
Established patient visit   Patient: Peter Spencer   DOB: 09-23-1960   62 y.o. Male  MRN: [id number] Visit Date: 06/13/2023  Today's healthcare provider: Alfredia Ferguson, PA-C   Chief Complaint  Patient presents with   Pneumonia    End of oct- stated he was outside for a bit. Sinuses started bothering him, he is allergic to ragweed. 10/21 had a telehealth appt- given prednisone and amox. Has a bunch of mucus.  His chest and cough is the worst. Very fatigue. Slight shortness of breath with cardio.   OTC- motrin or tylenol as needed.    Subjective    Pt reports with four weeks of sinus congestion, cough, and hoarseness. Pt states he was treated a few weeks ago, 10/21 with amoxicillin and prednisone, and reports he felt almost better, but has been traveling for work and his symptoms have worsened in the last week or so. Reports congestion, facial pressure, thick/yellow nasal mucous. He has a cough with some white/clear mucous. Reports shortness of breath with cardio exercise only. Some dizziness with rapid head movements.   Medications: Outpatient Medications Prior to Visit  Medication Sig   aspirin EC 81 MG tablet Take 81 mg by mouth daily.   azelastine (ASTELIN) 0.1 % nasal spray Place 2 sprays into both nostrils at bedtime as needed for rhinitis or allergies. Use in each nostril as directed   Cholecalciferol (VITAMIN D) 125 MCG (5000 UT) CAPS Take 5,000 Units by mouth daily.   esomeprazole (NEXIUM) 40 MG capsule Take 1 capsule (40 mg total) by mouth daily. TAKE 1 CAPSULE BY MOUTH TWICE DAILY FOR  6  WEEKS  THEN  1  CAPSULE  DAILY.   fexofenadine (ALLEGRA) 180 MG tablet Take 180 mg by mouth at bedtime.   fluorouracil (EFUDEX) 5 % cream Apply 1 Application topically daily.   fluticasone (FLONASE) 50 MCG/ACT nasal spray Place 1 spray into both nostrils as needed for allergies or rhinitis.   hydrocortisone 2.5 % lotion Apply 1 application topically as needed (rocesea).    ketoconazole (NIZORAL) 2 % cream Apply 1 Application topically daily.   levothyroxine (SYNTHROID) 112 MCG tablet Take 1 tablet (112 mcg total) by mouth daily before breakfast.   losartan (COZAAR) 25 MG tablet Take 1 tablet (25 mg total) by mouth daily.   magnesium oxide (MAG-OX) 400 MG tablet Take 400 mg by mouth daily.   metroNIDAZOLE (METROGEL) 0.75 % gel Apply 1 application. topically as needed (rash).   Multiple Vitamin (MULTIVITAMIN WITH MINERALS) TABS tablet Take 1 tablet by mouth daily. Unknown stregnth   Omega-3 1000 MG CAPS Take 3,800 mg by mouth daily.   pravastatin (PRAVACHOL) 40 MG tablet Take 1 tablet (40 mg total) by mouth every evening. Patient needs appointment for further refills. 1 st attempt   zolpidem (AMBIEN CR) 12.5 MG CR tablet TAKE 1 TABLET BY MOUTH AT BEDTIME AS NEEDED FOR SLEEP   No facility-administered medications prior to visit.    Review of Systems  Constitutional:  Negative for fatigue and fever.  HENT:  Positive for congestion, facial swelling, postnasal drip, rhinorrhea, sinus pressure, sinus pain and sore throat.   Respiratory:  Positive for cough. Negative for shortness of breath.   Cardiovascular:  Negative for chest pain, palpitations and leg swelling.  Neurological:  Positive for headaches. Negative for dizziness.       Objective    BP 134/74   Pulse 87   Temp 98 F (36.7 C) (Oral)  SpO2 100%    Physical Exam Constitutional:      General: He is awake.     Appearance: He is well-developed.  HENT:     Head: Normocephalic.     Ears:     Comments: B/l TM bulging with serous fluid    Mouth/Throat:     Pharynx: Posterior oropharyngeal erythema present. No oropharyngeal exudate.  Eyes:     Conjunctiva/sclera: Conjunctivae normal.  Cardiovascular:     Rate and Rhythm: Normal rate and regular rhythm.     Heart sounds: Normal heart sounds.  Pulmonary:     Effort: Pulmonary effort is normal.     Breath sounds: Normal breath sounds.  Skin:     General: Skin is warm.  Neurological:     Mental Status: He is alert and oriented to person, place, and time.  Psychiatric:        Attention and Perception: Attention normal.        Mood and Affect: Mood normal.        Speech: Speech normal.        Behavior: Behavior is cooperative.      No results found for any visits on 06/13/23.  Assessment & Plan    Non-recurrent acute serous otitis media of both ears -     Amoxicillin-Pot Clavulanate; Take 1 tablet by mouth 2 (two) times daily for 7 days.  Dispense: 14 tablet; Refill: 0  Acute non-recurrent frontal sinusitis -     Amoxicillin-Pot Clavulanate; Take 1 tablet by mouth 2 (two) times daily for 7 days.  Dispense: 14 tablet; Refill: 0  Encouraging pt cont antihistamines, do saline rinses before using nasal sprays-- recommend flonase in AM and azelastine in PM. If symptoms persist through the weekend-- rx augmentin bid x 7 days.  Return if symptoms worsen or fail to improve.       Alfredia Ferguson, PA-C  [facility] at [facility] [phone] (phone) [phone] (fax)  [facility]

## 2023-06-16 ENCOUNTER — Encounter: Payer: Self-pay | Admitting: Physician Assistant

## 2023-06-17 ENCOUNTER — Ambulatory Visit (HOSPITAL_BASED_OUTPATIENT_CLINIC_OR_DEPARTMENT_OTHER): Payer: BC Managed Care – PPO

## 2023-07-15 ENCOUNTER — Ambulatory Visit (HOSPITAL_BASED_OUTPATIENT_CLINIC_OR_DEPARTMENT_OTHER): Payer: BC Managed Care – PPO

## 2023-07-15 DIAGNOSIS — I2510 Atherosclerotic heart disease of native coronary artery without angina pectoris: Secondary | ICD-10-CM | POA: Diagnosis not present

## 2023-07-15 DIAGNOSIS — R0600 Dyspnea, unspecified: Secondary | ICD-10-CM

## 2023-07-16 LAB — ECHOCARDIOGRAM COMPLETE
Calc EF: 50.4 %
S' Lateral: 2.98 cm
Single Plane A2C EF: 49.4 %
Single Plane A4C EF: 49.8 %

## 2023-07-29 ENCOUNTER — Telehealth: Payer: Self-pay

## 2023-07-29 NOTE — Telephone Encounter (Signed)
 Left message on My Chart with normal Echo results per Dr. Vanetta Shawl note. Routed to PCP

## 2023-08-01 ENCOUNTER — Telehealth: Payer: Self-pay

## 2023-08-01 NOTE — Telephone Encounter (Signed)
 LVM per DPR regarding stable Echo results per Dr. Vanetta Shawl note. Routed to PCP.

## 2023-08-02 ENCOUNTER — Other Ambulatory Visit: Payer: Self-pay | Admitting: Internal Medicine

## 2023-08-05 ENCOUNTER — Other Ambulatory Visit: Payer: Self-pay | Admitting: Internal Medicine

## 2023-08-27 ENCOUNTER — Other Ambulatory Visit: Payer: Self-pay | Admitting: Internal Medicine

## 2023-08-30 ENCOUNTER — Other Ambulatory Visit: Payer: Self-pay | Admitting: Internal Medicine

## 2023-09-03 ENCOUNTER — Other Ambulatory Visit: Payer: Self-pay | Admitting: Internal Medicine

## 2023-09-12 ENCOUNTER — Other Ambulatory Visit: Payer: Self-pay | Admitting: Internal Medicine

## 2023-09-22 ENCOUNTER — Ambulatory Visit (INDEPENDENT_AMBULATORY_CARE_PROVIDER_SITE_OTHER): Payer: Medicare Other | Admitting: Internal Medicine

## 2023-09-22 VITALS — BP 118/75 | HR 90 | Temp 98.8°F | Resp 16 | Ht 74.0 in | Wt 188.0 lb

## 2023-09-22 DIAGNOSIS — I2510 Atherosclerotic heart disease of native coronary artery without angina pectoris: Secondary | ICD-10-CM

## 2023-09-22 DIAGNOSIS — E782 Mixed hyperlipidemia: Secondary | ICD-10-CM

## 2023-09-22 DIAGNOSIS — E039 Hypothyroidism, unspecified: Secondary | ICD-10-CM

## 2023-09-22 DIAGNOSIS — K219 Gastro-esophageal reflux disease without esophagitis: Secondary | ICD-10-CM

## 2023-09-22 DIAGNOSIS — I10 Essential (primary) hypertension: Secondary | ICD-10-CM | POA: Diagnosis not present

## 2023-09-22 MED ORDER — ESOMEPRAZOLE MAGNESIUM 40 MG PO CPDR: 40.0000 mg | DELAYED_RELEASE_CAPSULE | Freq: Every day | ORAL | 0 refills | Status: DC

## 2023-09-22 MED ORDER — PRAVASTATIN SODIUM 40 MG PO TABS: 40.0000 mg | ORAL_TABLET | Freq: Every day | ORAL | 5 refills | Status: DC

## 2023-09-22 MED ORDER — ZOLPIDEM TARTRATE ER 12.5 MG PO TBCR: 12.5000 mg | EXTENDED_RELEASE_TABLET | Freq: Every evening | ORAL | 1 refills | Status: DC | PRN

## 2023-09-22 MED ORDER — LEVOTHYROXINE SODIUM 112 MCG PO TABS: 112.0000 ug | ORAL_TABLET | Freq: Every day | ORAL | 1 refills | Status: DC

## 2023-09-22 NOTE — Progress Notes (Signed)
 Subjective:    Patient ID: Peter Spencer, male    DOB: 23-Mar-1961, 63 y.o.   MRN: [id number]  DOS:  09/22/2023 Type of visit - description: Routine visit  We address his chronic medical problems. Does feel occasionally tired but denies chest pain or difficulty breathing. Has occasional heartburn but is not persistent.  Denies dysphagia or odynophagia. Ambulatory BPs are checked, a couple of days ago was 118/78. Good med compliance.     Review of Systems See above   Past Medical History:  Diagnosis Date   Acquired hypothyroidism 11/18/2014   Annual physical exam 03/01/2020   Anxiety    Benign essential hypertension 11/18/2014   CAD (coronary artery disease)    CAD in native artery 10/23/2018   Chronic insomnia 11/18/2014   Dupuytren's contracture of left hand 11/25/2019   GERD (gastroesophageal reflux disease)    GERD without esophagitis 10/23/2018   Hyperlipidemia    Hypertension    no meds now   Hypertensive urgency 09/28/2013   Hypothyroidism    LBBB (left bundle branch block) 09/29/2013   Left bundle branch block    OSA (obstructive sleep apnea)    PCP NOTES >>>>>>>>>>>>>> 03/02/2020   Retinal detachment 06/2018   Right   Retinal detachment, right 11/25/2019   SCC (squamous cell carcinoma)    Seasonal allergies    Thrombocytopenia (HCC)--history of, reports extensive w/u (early 2000, ITP?) 11/29/2020   Thyroid disease     Past Surgical History:  Procedure Laterality Date   BASAL CELL CARCINOMA EXCISION N/A 10/30/2020   Procedure: Excision of scalp squamous cell carcinoma with frozen section, application of integra;  Surgeon: Allena Napoleon, MD;  Location: [facility];  Service: Plastics;  Laterality: N/A;  1 hour, please   COLONOSCOPY  01/15/2013   .   COLONOSCOPY  11/27/2015   [facility]. Devona Konig, MD. Diverticulosis of the whole colon, ascending colon and descending colon. Grade 2 internal hemorrhoids. Repeat in 5 years.    DUPUYTREN CONTRACTURE RELEASE Left 05/2016   FASCIECTOMY Left 07/11/2016   Procedure: FASCIECTOMY left index possible joint release;  Surgeon: Cindee Salt, MD;  Location: [facility];  Service: Orthopedics;  Laterality: Left;  FASCIECTOMY left index possible joint release   KNEE SURGERY     Left eye retna repaired      MOHS SURGERY  2017   NASAL FRACTURE SURGERY     RETINAL DETACHMENT SURGERY Right 06/2018   TOE SURGERY     TONSILLECTOMY      Current Outpatient Medications  Medication Instructions   aspirin EC 81 mg, Daily   azelastine (ASTELIN) 0.1 % nasal spray 2 sprays, Each Nare, At bedtime PRN, Use in each nostril as directed   esomeprazole (NEXIUM) 40 mg, Oral, Daily, TAKE 1 CAPSULE BY MOUTH TWICE DAILY FOR  6  WEEKS  THEN  1  CAPSULE  DAILY.   fluorouracil (EFUDEX) 5 % cream 1 Application, Daily   fluticasone (FLONASE) 50 MCG/ACT nasal spray 1 spray, As needed   hydrocortisone 2.5 % lotion 1 application , As needed   ketoconazole (NIZORAL) 2 % cream 1 Application, Daily   levothyroxine (SYNTHROID) 112 mcg, Oral, Daily before breakfast   magnesium oxide (MAG-OX) 400 mg, Daily   metroNIDAZOLE (METROGEL) 0.75 % gel 1 application , As needed   Multiple Vitamin (MULTIVITAMIN WITH MINERALS) TABS tablet 1 tablet, Daily   Omega-3 3,800 mg, Daily   pravastatin (PRAVACHOL) 40 mg, Oral, Daily at  bedtime   Vitamin D 5,000 Units, Daily   zolpidem (AMBIEN CR) 12.5 mg, Oral, At bedtime PRN, for sleep       Objective:   Physical Exam BP 118/75 (BP Location: Left Arm, Patient Position: Sitting, Cuff Size: Normal)   Pulse 90   Temp 98.8 F (37.1 C) (Oral)   Resp 16   Ht 6\' 2"  (1.88 m)   Wt 188 lb (85.3 kg)   SpO2 100%   BMI 24.14 kg/m  General:   Well developed, NAD, BMI noted. HEENT:  Normocephalic . Face symmetric, atraumatic Lungs:  CTA B Normal respiratory effort, no intercostal retractions, no accessory muscle use. Heart: RRR,  no murmur.  Lower  extremities: no pretibial edema bilaterally  Skin: Not pale. Not jaundice Neurologic:  alert & oriented X3.  Speech normal, gait appropriate for age and unassisted Psych--  Cognition and judgment appear intact.  Cooperative with normal attention span and concentration.  Behavior appropriate. No anxious or depressed appearing.      Assessment     ASSESSMENT (new patient 11/2019) HTN, history of hypertensive urgency High cholesterol Thyroid disease CAD, LBBB (+ LAD disease per coronary CT, neg a stress test 2017) OSA - borderline per pt  Dupuytren's Right retinal detachment 2019 [facility] Rosacea dx ~ 2017, seen @ [place] derm, increased LFTs w/ tetracycline per chart review Retinal detachment , surgery R 22019, L 05-2020 Thrombocytopenia: w/u in [facility] ~ 1990s, early 2000s, including a BM Bx , ITP ?  PLAN HTN: At home BP is very good, upon arrival BP was 149/78.  Repeated: 118/75. Currently lifestyle controlled. High cholesterol: Saw cardiology 03/14/2023, LDL was 84, they agree on continue Pravachol 40, improved lifestyle and recheck FLP which has not been rechecked. Will check a FLP, if cholesterol needs improvement he would prefer to stay on Pravachol and add Zetia rather than try a different statin GERD: Occasional heartburn, no symptoms when he avoids triggers.  Request a refill on PPIs, to be used on as-needed basis.  Rx sent.  If symptoms severe needs to see GI again. Hypothyroidism: Refill Synthroid, check TSH. Insomnia: Needs a refill on Ambien, PDMP reviewed, slightly early for a refill but Rx was sent CAD: Asymptomatic.  Saw cardiology, note reviewed. Insomnia: Request on Ambien refill, PDMP okay, RF sent. Preventive care: Had a flu shot.  Rec to consider PNM 20, COVID booster. RTC CPX 12/2023

## 2023-09-22 NOTE — Assessment & Plan Note (Signed)
 HTN: At home BP is very good, upon arrival BP was 149/78.  Repeated: 118/75. Currently lifestyle controlled. High cholesterol: Saw cardiology 03/14/2023, LDL was 84, they agree on continue Pravachol 40, improved lifestyle and recheck FLP which has not been rechecked. Will check a FLP, if cholesterol needs improvement he would prefer to stay on Pravachol and add Zetia rather than try a different statin GERD: Occasional heartburn, no symptoms when he avoids triggers.  Request a refill on PPIs, to be used on as-needed basis.  Rx sent.  If symptoms severe needs to see GI again. Hypothyroidism: Refill Synthroid, check TSH. Insomnia: Needs a refill on Ambien, PDMP reviewed, slightly early for a refill but Rx was sent CAD: Asymptomatic.  Saw cardiology, note reviewed. Insomnia: Request on Ambien refill, PDMP okay, RF sent. Preventive care: Had a flu shot.  Rec to consider PNM 20, COVID booster. RTC CPX 12/2023

## 2023-09-22 NOTE — Patient Instructions (Signed)
 Go to the front of the desk. Arrange for a lab appointment fasting. Next visit with me by 12/2023 for a physical exam.  Check the  blood pressure regularly Blood pressure goal:  between 110/65 and  135/85. If it is consistently higher or lower, let me know  Vaccines you should consider: Pneumonia shot (PNM 20) COVID booster

## 2023-09-26 ENCOUNTER — Other Ambulatory Visit: Payer: Self-pay | Admitting: Internal Medicine

## 2023-09-26 ENCOUNTER — Other Ambulatory Visit (INDEPENDENT_AMBULATORY_CARE_PROVIDER_SITE_OTHER): Payer: Medicare Other

## 2023-09-26 DIAGNOSIS — I10 Essential (primary) hypertension: Secondary | ICD-10-CM | POA: Diagnosis not present

## 2023-09-26 DIAGNOSIS — E782 Mixed hyperlipidemia: Secondary | ICD-10-CM | POA: Diagnosis not present

## 2023-09-26 DIAGNOSIS — E039 Hypothyroidism, unspecified: Secondary | ICD-10-CM

## 2023-09-26 LAB — COMPREHENSIVE METABOLIC PANEL
ALT: 17 U/L (ref 0–53)
AST: 16 U/L (ref 0–37)
Albumin: 4.4 g/dL (ref 3.5–5.2)
Alkaline Phosphatase: 54 U/L (ref 39–117)
BUN: 20 mg/dL (ref 6–23)
CO2: 27 meq/L (ref 19–32)
Calcium: 8.5 mg/dL (ref 8.4–10.5)
Chloride: 104 meq/L (ref 96–112)
Creatinine, Ser: 1.06 mg/dL (ref 0.40–1.50)
GFR: 73.86 mL/min (ref >60.00)
Glucose, Bld: 87 mg/dL (ref 70–99)
Potassium: 4.4 meq/L (ref 3.5–5.1)
Sodium: 137 meq/L (ref 135–145)
Total Bilirubin: 0.7 mg/dL (ref 0.2–1.2)
Total Protein: 6.3 g/dL (ref 6.0–8.3)

## 2023-09-26 LAB — LIPID PANEL
Cholesterol: 153 mg/dL (ref 0–200)
HDL: 50 mg/dL (ref >39.00)
LDL Cholesterol: 94 mg/dL (ref 0–99)
NonHDL: 102.69
Total CHOL/HDL Ratio: 3
Triglycerides: 41 mg/dL (ref 0.0–149.0)
VLDL: 8.2 mg/dL (ref 0.0–40.0)

## 2023-09-26 LAB — TSH: TSH: 2.68 u[IU]/mL (ref 0.35–5.50)

## 2023-09-26 NOTE — Telephone Encounter (Signed)
 Requesting: LOSARTAN POTASSIUM 25 MG TAB  Last Visit: 09/22/2023 Next Visit: Visit date not found Last Refill: 08/04/2023 D/C'd on 09/22/23  Please Advise

## 2023-09-28 ENCOUNTER — Encounter: Payer: Self-pay | Admitting: Internal Medicine

## 2023-09-29 MED ORDER — EZETIMIBE 10 MG PO TABS: 10.0000 mg | ORAL_TABLET | Freq: Every day | ORAL | 3 refills | Status: AC

## 2023-09-29 NOTE — Addendum Note (Signed)
 Addended by: Conrad Glenview D on: 09/29/2023 07:51 AM   Modules accepted: Orders

## 2023-10-24 ENCOUNTER — Telehealth: Payer: Self-pay | Admitting: Cardiology

## 2023-10-24 NOTE — Telephone Encounter (Signed)
 Pt states that he I shaving really bad gerd and very fatigued/tired, malaise, just not feeling himself is the best he could describe. Wants to speak with someone, requesting cb

## 2023-10-24 NOTE — Telephone Encounter (Signed)
 Left message for the patient to call back.

## 2023-10-27 NOTE — Telephone Encounter (Signed)
 Left message for the patient to call back.

## 2023-10-28 NOTE — Telephone Encounter (Signed)
 Follow Up:     Patient is returning a call from today.

## 2023-10-28 NOTE — Telephone Encounter (Signed)
 Left message for the patient to call back.

## 2023-10-29 NOTE — Telephone Encounter (Signed)
 Left message for the patient to call back.

## 2023-11-13 ENCOUNTER — Encounter: Payer: Self-pay | Admitting: Internal Medicine

## 2023-12-02 ENCOUNTER — Ambulatory Visit: Admitting: Cardiology

## 2023-12-03 ENCOUNTER — Encounter: Payer: Self-pay | Admitting: Cardiology

## 2023-12-03 ENCOUNTER — Ambulatory Visit: Attending: Cardiology | Admitting: Cardiology

## 2023-12-03 VITALS — BP 138/80 | HR 83 | Ht 74.0 in | Wt 183.6 lb

## 2023-12-03 DIAGNOSIS — I447 Left bundle-branch block, unspecified: Secondary | ICD-10-CM

## 2023-12-03 DIAGNOSIS — I2510 Atherosclerotic heart disease of native coronary artery without angina pectoris: Secondary | ICD-10-CM | POA: Diagnosis present

## 2023-12-03 DIAGNOSIS — E782 Mixed hyperlipidemia: Secondary | ICD-10-CM | POA: Diagnosis present

## 2023-12-03 NOTE — Patient Instructions (Signed)
 Medication Instructions:  Your physician recommends that you continue on your current medications as directed. Please refer to the Current Medication list given to you today.  *If you need a refill on your cardiac medications before your next appointment, please call your pharmacy*   Lab Work: Lipid, AST, ALT- today If you have labs (blood work) drawn today and your tests are completely normal, you will receive your results only by: MyChart Message (if you have MyChart) OR A paper copy in the mail If you have any lab test that is abnormal or we need to change your treatment, we will call you to review the results.   Testing/Procedures: Your physician has requested that you have a lexiscan myoview. For further information please visit [email or web address] Please follow instruction sheet, as given.  The test will take approximately 3 to 4 hours to complete; you may bring reading material.  If someone comes with you to your appointment, they will need to remain in the main lobby due to limited space in the testing area.    How to prepare for your Myocardial Perfusion Test: Do not eat or drink 3 hours prior to your test, except you may have water. Do not consume products containing caffeine (regular or decaffeinated) 12 hours prior to your test. (ex: coffee, chocolate, sodas, tea). Do bring a list of your current medications with you.  If not listed below, you may take your medications as normal. Do wear comfortable clothes (no dresses or overalls) and walking shoes, tennis shoes preferred (No heels or open toe shoes are allowed). Do NOT wear cologne, perfume, aftershave, or lotions (deodorant is allowed). If these instructions are not followed, your test will have to be rescheduled.     Follow-Up: At [facility], you and your health needs are our priority.  As part of our continuing mission to provide you with exceptional heart care, we have created designated Provider Care Teams.  These  Care Teams include your primary Cardiologist (physician) and Advanced Practice Providers (APPs -  Physician Assistants and Nurse Practitioners) who all work together to provide you with the care you need, when you need it.  We recommend signing up for the patient portal called "MyChart".  Sign up information is provided on this After Visit Summary.  MyChart is used to connect with patients for Virtual Visits (Telemedicine).  Patients are able to view lab/test results, encounter notes, upcoming appointments, etc.  Non-urgent messages can be sent to your provider as well.   To learn more about what you can do with MyChart, go to [email or web address].    Your next appointment:   6 month(s)  The format for your next appointment:   In Person  Provider:   Gypsy Balsam, MD    Other Instructions NA

## 2023-12-03 NOTE — Progress Notes (Signed)
 Cardiology Office Note:    Date:  12/03/2023   ID:  Peter Spencer, DOB 08/21/1960, MRN [id number]  PCP:  Ezell Hollow, MD  Cardiologist:  Ralene Burger, MD    Referring MD: Ezell Hollow, MD   Chief Complaint  Patient presents with   Fatigue    History of Present Illness:    Peter Spencer is a 63 y.o. male past medical history significant coronary disease he did have coronary CT angio which showed moderate stenosis of LAD that was in 2017, after that stress test was negative, ejection fraction 4045%, chronic left bundle branch block.  Comes today 2 months for follow-up he is not doing well.  He complained of having a lot of heartburn and he threw from description he gave me majority of symptoms look like heartburn but there is some exertional pain as well.  Will schedule him to have a stress test.  Complain of having weakness fatigue and tiredness as well  Past Medical History:  Diagnosis Date   Acquired hypothyroidism 11/18/2014   Annual physical exam 03/01/2020   Anxiety    Benign essential hypertension 11/18/2014   CAD (coronary artery disease)    CAD in native artery 10/23/2018   Chronic insomnia 11/18/2014   Dupuytren's contracture of left hand 11/25/2019   GERD (gastroesophageal reflux disease)    GERD without esophagitis 10/23/2018   Hyperlipidemia    Hypertension    no meds now   Hypertensive urgency 09/28/2013   Hypothyroidism    LBBB (left bundle branch block) 09/29/2013   Left bundle branch block    OSA (obstructive sleep apnea)    PCP NOTES >>>>>>>>>>>>>> 03/02/2020   Retinal detachment 06/2018   Right   Retinal detachment, right 11/25/2019   SCC (squamous cell carcinoma)    Seasonal allergies    Thrombocytopenia (HCC)--history of, reports extensive w/u (early 2000, ITP?) 11/29/2020   Thyroid  disease     Past Surgical History:  Procedure Laterality Date   BASAL CELL CARCINOMA EXCISION N/A 10/30/2020   Procedure: Excision of scalp squamous  cell carcinoma with frozen section, application of integra;  Surgeon: Barb Bonito, MD;  Location: [facility];  Service: Plastics;  Laterality: N/A;  1 hour, please   COLONOSCOPY  01/15/2013   .   COLONOSCOPY  11/27/2015   [facility]. Loyal Ruffing, MD. Diverticulosis of the whole colon, ascending colon and descending colon. Grade 2 internal hemorrhoids. Repeat in 5 years.   DUPUYTREN CONTRACTURE RELEASE Left 05/2016   FASCIECTOMY Left 07/11/2016   Procedure: FASCIECTOMY left index possible joint release;  Surgeon: Lyanne Sample, MD;  Location: [facility];  Service: Orthopedics;  Laterality: Left;  FASCIECTOMY left index possible joint release   KNEE SURGERY     Left eye retna repaired      MOHS SURGERY  2017   NASAL FRACTURE SURGERY     RETINAL DETACHMENT SURGERY Right 06/2018   TOE SURGERY     TONSILLECTOMY      Current Medications: Current Meds  Medication Sig   aspirin  EC 81 MG tablet Take 81 mg by mouth daily.   azelastine  (ASTELIN ) 0.1 % nasal spray Place 2 sprays into both nostrils at bedtime as needed for rhinitis or allergies. Use in each nostril as directed   Cholecalciferol (VITAMIN D) 125 MCG (5000 UT) CAPS Take 5,000 Units by mouth daily.   esomeprazole  (NEXIUM ) 40 MG capsule Take 1 capsule (40 mg total) by mouth daily.  TAKE 1 CAPSULE BY MOUTH TWICE DAILY FOR  6  WEEKS  THEN  1  CAPSULE  DAILY.   ezetimibe  (ZETIA ) 10 MG tablet Take 1 tablet (10 mg total) by mouth daily.   fluorouracil (EFUDEX) 5 % cream Apply 1 Application topically daily.   fluticasone (FLONASE) 50 MCG/ACT nasal spray Place 1 spray into both nostrils as needed for allergies or rhinitis.   hydrocortisone 2.5 % lotion Apply 1 application topically as needed (rocesea).   ketoconazole (NIZORAL) 2 % cream Apply 1 Application topically daily.   levothyroxine  (SYNTHROID ) 112 MCG tablet Take 1 tablet (112 mcg total) by mouth daily before breakfast.   magnesium  oxide (MAG-OX)  400 MG tablet Take 400 mg by mouth daily.   metroNIDAZOLE  (METROGEL ) 0.75 % gel Apply 1 application. topically as needed (rash).   Multiple Vitamin (MULTIVITAMIN WITH MINERALS) TABS tablet Take 1 tablet by mouth daily. Unknown stregnth   Omega-3 1000 MG CAPS Take 3,800 mg by mouth daily.   pravastatin  (PRAVACHOL ) 40 MG tablet Take 1 tablet (40 mg total) by mouth at bedtime.   zolpidem  (AMBIEN  CR) 12.5 MG CR tablet Take 1 tablet (12.5 mg total) by mouth at bedtime as needed. for sleep (Patient taking differently: Take 12.5 mg by mouth at bedtime as needed for sleep. for sleep)     Allergies:   Dust mite extract, Mold extract [trichophyton], Tetracyclines & related, and Sulfa antibiotics   Social History   Socioeconomic History   Marital status: Married    Spouse name: Not on file   Number of children: 2   Years of education: Not on file   Highest education level: Not on file  Occupational History   Occupation: self employed    Occupation: Facilities manager, [employer]  Tobacco Use   Smoking status: Never   Smokeless tobacco: Never  Vaping Use   Vaping status: Never Used  Substance and Sexual Activity   Alcohol use: Yes   Drug use: No   Sexual activity: Not on file  Other Topics Concern   Not on file  Social History Narrative   Household: pt and wife   Social Drivers of Corporate investment banker Strain: Low Risk  (03/03/2019)   Received from [facility] visits prior to 09/28/2022., [facility] visits prior to 09/28/2022.   Overall Financial Resource Strain (CARDIA)    Difficulty of Paying Living Expenses: Not hard at all  Food Insecurity: No Food Insecurity (03/03/2019)   Received from [facility] visits prior to 09/28/2022., [facility] visits prior to 09/28/2022.   Hunger Vital Sign    Worried About Running Out of Food in the Last Year: Never true    Ran Out of Food in the Last Year: Never true   Transportation Needs: No Transportation Needs (03/03/2019)   Received from [facility] visits prior to 09/28/2022., [facility] visits prior to 09/28/2022.   PRAPARE - Administrator, Civil Service (Medical): No    Lack of Transportation (Non-Medical): No  Physical Activity: Not on file  Stress: No Stress Concern Present (03/03/2019)   [facility] from [facility] visits prior to 09/28/2022., [facility] visits prior to 09/28/2022.   [employer] of Occupational Health - Occupational Stress Questionnaire    Feeling of Stress : Not at all  Social Connections: Unknown (12/11/2021)   [facility]   Social Network    Social Network: Not on file     Family History: The patient's family history includes Atrial fibrillation in his father; CAD in an other family member; Dementia in his father; Pancreatic cancer in his mother; Parkinsonism in his father. There is no history of Stroke, Diabetes Mellitus II, Colon cancer, Prostate cancer, Liver disease, or Esophageal cancer. ROS:   Please see the history of present illness.    All 14 point review of systems negative except as described per history of present illness  EKGs/Labs/Other Studies Reviewed:         Recent Labs: 03/01/2023: Hemoglobin 13.7; Platelets 112 09/26/2023: ALT 17; BUN 20; Creatinine, Ser 1.06; Potassium 4.4; Sodium 137; TSH 2.68  Recent Lipid Panel    Component Value Date/Time   CHOL 153 09/26/2023 0931   CHOL 151 02/15/2022 0855   TRIG 41.0 09/26/2023 0931   HDL 50.00 09/26/2023 0931   HDL 54 02/15/2022 0855   CHOLHDL 3 09/26/2023 0931   VLDL 8.2 09/26/2023 0931   LDLCALC 94 09/26/2023 0931   LDLCALC 85 02/15/2022 0855    Physical Exam:    VS:  BP 138/80 (BP Location: Right Arm, Patient Position: Sitting)   Pulse 83   Ht 6\' 2"  (1.88 m)   Wt 183 lb 9.6 oz (83.3 kg)   SpO2 97%   BMI 23.57 kg/m      Wt Readings from Last 3 Encounters:  12/03/23 183 lb 9.6 oz (83.3 kg)  09/22/23 188 lb (85.3 kg)  03/14/23 184 lb (83.5 kg)     GEN:  Well nourished, well developed in no acute distress HEENT: Normal NECK: No JVD; No carotid bruits LYMPHATICS: No lymphadenopathy CARDIAC: RRR, no murmurs, no rubs, no gallops RESPIRATORY:  Clear to auscultation without rales, wheezing or rhonchi  ABDOMEN: Soft, non-tender, non-distended MUSCULOSKELETAL:  No edema; No deformity  SKIN: Warm and dry LOWER EXTREMITIES: no swelling NEUROLOGIC:  Alert and oriented x 3 PSYCHIATRIC:  Normal affect   ASSESSMENT:    1. Coronary artery disease involving native coronary artery of native heart without angina pectoris   2. LBBB (left bundle branch block)   3. Mixed hyperlipidemia    PLAN:    In order of problems listed above:  Coronary disease.  He does have some symptoms suggesting GERD but I want a make sure were not missing any coronary disease, therefore, we will schedule him to have Lexiscan. Left bundle branch block.  Noted, chronic.  Stable. Mixed dyslipidemia I did review K PN which show LDL of 94 HDL 50.  He was put on extra medication since that time Zetia .  Will recheck his fasting lipid profile today.   Medication Adjustments/Labs and Tests Ordered: Current medicines are reviewed at length with the patient today.  Concerns regarding medicines are outlined above.  No orders of the defined types were placed in this encounter.  Medication changes: No orders of the defined types were placed in this encounter.   Signed, Manfred Seed, MD, [facility] 12/03/2023 10:44 AM    [facility]

## 2023-12-03 NOTE — Addendum Note (Signed)
 Addended by: Shawnee Dellen D on: 12/03/2023 10:55 AM   Modules accepted: Orders

## 2023-12-04 LAB — AST: AST: 17 IU/L (ref 0–40)

## 2023-12-04 LAB — ALT: ALT: 21 IU/L (ref 0–44)

## 2023-12-04 LAB — LIPID PANEL
Chol/HDL Ratio: 2.7 ratio (ref 0.0–5.0)
Cholesterol, Total: 142 mg/dL (ref 100–199)
HDL: 52 mg/dL (ref >39)
LDL Chol Calc (NIH): 77 mg/dL (ref 0–99)
Triglycerides: 62 mg/dL (ref 0–149)
VLDL Cholesterol Cal: 13 mg/dL (ref 5–40)

## 2023-12-05 ENCOUNTER — Other Ambulatory Visit: Payer: Self-pay | Admitting: Internal Medicine

## 2023-12-09 ENCOUNTER — Ambulatory Visit: Payer: Self-pay | Admitting: Emergency Medicine

## 2023-12-10 NOTE — Addendum Note (Signed)
 Addended by: Ralene Burger on: 12/10/2023 11:45 AM   Modules accepted: Orders

## 2023-12-12 ENCOUNTER — Other Ambulatory Visit: Payer: Self-pay | Admitting: Cardiology

## 2023-12-12 ENCOUNTER — Encounter: Payer: Self-pay | Admitting: Cardiology

## 2023-12-12 DIAGNOSIS — I2510 Atherosclerotic heart disease of native coronary artery without angina pectoris: Secondary | ICD-10-CM

## 2023-12-15 ENCOUNTER — Encounter (HOSPITAL_COMMUNITY): Payer: Self-pay

## 2023-12-16 ENCOUNTER — Ambulatory Visit (HOSPITAL_COMMUNITY)
Admission: RE | Admit: 2023-12-16 | Discharge: 2023-12-16 | Disposition: A | Discharge status: Home / Self Care | Source: Ambulatory Visit | Attending: Cardiology | Admitting: Cardiology

## 2023-12-16 DIAGNOSIS — I2510 Atherosclerotic heart disease of native coronary artery without angina pectoris: Secondary | ICD-10-CM

## 2023-12-16 MED ORDER — TECHNETIUM TC 99M TETROFOSMIN IV KIT
10.3000 | PACK | Freq: Once | INTRAVENOUS | Status: AC | PRN
  Administered 2023-12-16: 10.3 via INTRAVENOUS

## 2023-12-16 MED ORDER — REGADENOSON 0.4 MG/5ML IV SOLN
INTRAVENOUS | Status: AC
  Filled 2023-12-16: qty 5

## 2023-12-16 MED ORDER — REGADENOSON 0.4 MG/5ML IV SOLN
0.4000 mg | Freq: Once | INTRAVENOUS | Status: AC
Start: 2023-12-16 — End: 2023-12-16
  Administered 2023-12-16: 0.4 mg via INTRAVENOUS

## 2023-12-16 MED ORDER — TECHNETIUM TC 99M TETROFOSMIN IV KIT
31.1000 | PACK | Freq: Once | INTRAVENOUS | Status: AC | PRN
  Administered 2023-12-16: 31.1 via INTRAVENOUS

## 2023-12-17 LAB — MYOCARDIAL PERFUSION IMAGING
LV dias vol: 122 mL (ref 62–150)
LV sys vol: 52 mL
Nuc Stress EF: 55 %
Peak HR: 105 {beats}/min
Rest HR: 70 {beats}/min
Rest Nuclear Isotope Dose: 10.3 mCi
SDS: 0
SRS: 7
SSS: 4
ST Depression (mm): 0 mm
Stress Nuclear Isotope Dose: 31.1 mCi
TID: 0.99

## 2023-12-22 ENCOUNTER — Ambulatory Visit: Payer: Self-pay | Admitting: Cardiology

## 2023-12-24 NOTE — Progress Notes (Signed)
 Otolaryngology Office Visit Note  Seen at the kind request of No ref. provider found for evaluation of: Ear Symptoms  Assessment and Plan Problem List Items Addressed This Visit     Episodic tension-type headache, not intractable - Primary   Relevant Medications   topiramate (TOPAMAX) 25 mg tablet   Cervicalgia   Relevant Orders   Ambulatory referral to Physical Therapy   Referred otalgia of both ears   Relevant Orders   Ambulatory referral to Physical Therapy  Left ear fullness and discomfort. Chronic recurrent in nature.  Has not responded to allergy or steroid therapy.  Thinking he might have some component of migraine.  He is on Topamax that seems to be helping but he wants to increase the dose. EXAM shows normal external canals and tympanic membranes and move well on insufflation.  Weber tuning fork exam did not lateralize. PLAN: Could be a pressure sensation referred from cervical jaw joint regions.  He wanted to pursue physical therapy for this.  Will set up accordingly.  In the meantime increase Topamax to 50 mg at bedtime.  Surgery To Schedule:  HPI Chief Complaint  Patient presents with  . Ear Problem    Pt is an established pt who is here today for Lt ear fullness with occasional pain.  Pt was last seen on 09/04/23 and ear tube was discussed.      Allergies Allergies  Allergen Reactions  . Mite Extract   . Tetracycline Other (See Comments)    Other reaction(s): Other (See Comments), Liver issues, Liver issues  . Trichophyton   . Sulfacetamide Sodium GI Intolerance    Other reaction(s): Nausea And Vomiting  . Trichophyton Mentagrophytes Allergenic Extract Rash    Current Meds  Current Outpatient Medications:  .  aspirin  81 mg EC tablet, Take  by mouth Once Daily., Disp: , Rfl:  .  azelastine  (ASTELIN ) 137 mcg (0.1 %) nasal spray, Administer 2 sprays into each nostril 2 (two) times a day. Use in each nostril as directed, Disp: 49.32 mL, Rfl: 11 .  Azelex 20 %  crea, APP AA BID, Disp: , Rfl: 2 .  esomeprazole  (NexIUM ) 40 mg DR capsule, Take 40 mg by mouth., Disp: , Rfl:  .  ezetimibe  (ZETIA ) 10 mg tablet, Take 10 mg by mouth daily., Disp: , Rfl:  .  fluorouraciL (EFUDEX) 5 % cream, Apply  topically 2 (two) times a day., Disp: 40 g, Rfl: 0 .  fluticasone propionate (FLONASE) 50 mcg/spray nasal spray, Administer 2 sprays into each nostril daily., Disp: 16 g, Rfl: 11 .  levothyroxine  (SYNTHROID ) 112 mcg tablet, Take 112 mcg by mouth Once Daily., Disp: 30 tablet, Rfl: 11 .  losartan  (COZAAR ) 25 mg tablet, Take 25 mg by mouth daily., Disp: , Rfl:  .  magnesium  oxide (MAG-OX) 200 mg magnesium  tab tablet, Take 200 mg by mouth Once Daily., Disp: , Rfl:  .  metroNIDAZOLE  (METROGEL ) 1 % gel, , Disp: , Rfl:  .  multivit-min-ferrous fumarate (Complete Multivitamin-Mineral) 9 mg iron/15 mL liqd, Take  by mouth., Disp: , Rfl:  .  omega 3-dha-epa-fish oil (OMEGA 3) 1,000 mg capsule, Take 3,800 mg by mouth Once Daily., Disp: , Rfl:  .  pravastatin  (PRAVACHOL ) 40 mg tablet, , Disp: , Rfl:  .  Xdemvy 0.25 % drop, INSTILL 1 DROP IN EACH EYE TWICE DAILY FOR 6 WEEKS, Disp: , Rfl:  .  zolpidem  3.5 mg subl, PLACE 1 TABLET UNDER TONGUE AT BEDTIME, Disp: 30 tablet, Rfl: 5 .  topiramate (TOPAMAX) 25 mg tablet, Take 2 tablets (50 mg total) by mouth nightly., Disp: 30 tablet, Rfl: 2  Active Problems Patient Active Problem List  Diagnosis  . Actinic keratosis  . Essential hypertension, benign  . Acquired hypothyroidism  . Allergic rhinitis  . Chronic insomnia  . Palpitations  . Encounter for colonoscopy due to history of adenomatous colonic polyps  . Family history of colonic polyps  . Contracture of palmar fascia  . Acute pain of left knee  . Strain of calf muscle  . 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 Pain in left finger(s)  . Episodic tension-type headache, not intractable  . Dysfunction of both eustachian tubes  . Postnasal drip  .  Dysfunction of left eustachian tube  . Cervicalgia  . Referred otalgia of both ears    PSH Past Surgical History:  Procedure Laterality Date  . CATARACT EXTRACTION     Procedure: CATARACT EXTRACTION  . DUPUYTREN CONTRACTURE RELEASE  2017   Procedure: DUPUYTREN CONTRACTURE RELEASE  . FUNCTIONAL ENDOSCOPIC SINUS SURGERY     Procedure: FUNCTIONAL ENDOSCOPIC SINUS SURGERY  . NASAL SEPTUM SURGERY (AKA DEVIATED SEPTUM)     Procedure: NASAL SEPTUM SURGERY  . OTHER SURGICAL HISTORY     Procedure: OTHER SURGICAL HISTORY (cyst toe)  . SKIN BIOPSY     Procedure: SKIN BIOPSY  . VITRECTOMY     Procedure: VITRECTOMY    Family Hx Family History  Problem Relation Name Age of Onset  . Cancer Mother    . Parkinsonism Father    . Rashes / Skin problems Neg Hx    . Eczema Neg Hx    . Psoriasis Neg Hx    . Diabetes Neg Hx    . Stroke Neg Hx    . Clotting disorder Neg Hx    . Allergy (severe) Neg Hx      Social Hx Social History   Socioeconomic History  . Marital status: Married    Spouse name: Not on file  . Number of children: Not on file  . Years of education: Not on file  . Highest education level: Not on file  Occupational History  . Not on file  Tobacco Use  . Smoking status: Never    Passive exposure: Never  . Smokeless tobacco: Never  Substance and Sexual Activity  . Alcohol use: Not Currently    Comment: Social  . Drug use: No  . Sexual activity: Not on file  Other Topics Concern  . Not on file  Social History Narrative   Lives w/ wife.  2 Grown kids (son & dtr, both in [place]).  Exercises 4x/week (running, biking, weight training).  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   Social Drivers of Health   Food Insecurity: No Food Insecurity (03/03/2019)   Received from [facility] visits prior to 09/28/2022.   Food vital sign   . Within the past 12 months, you worried that your food would run out before you got money to buy more:  Never true   . Within the past 12 months, the food you bought just didn't last and you didn't have money to get more: Never true  Transportation Needs: No Transportation Needs (03/03/2019)   Received from [facility] visits prior to 09/28/2022., [facility] visits prior to 09/28/2022.   PRAPARE - Transportation   . Lack of Transportation (Medical): No   . Lack of Transportation (  Non-Medical): No  Safety: Unknown (11/02/2021)   Received [facility]   HITS   . Physically Hurt: Not on file   . Insult or Talk Down To: Not on file   . Threaten Physical Harm: Not on file   . Scream or Curse: Not on file  Living Situation: Not on file    Vital Signs Vitals:   12/24/23 1544  BP: 138/88  Pulse: 79  Temp: 97.7 F (36.5 C)    Physical Exam   EXAMINATION  Constitutional General Appearance: Well appearing in no acute distress Ability to Communicate: Age appropriate, Voice clear without stridor Pain: None at this time  Head & Face Inspection:  Normocephalic  Eyes Ocular Motility: Extraocular muscles intact  Ears See Assessment and Plan   Nose Ext Nose: Dorsum midline Intranasal: No polyps or purulence  Oral Cavity, Mouth  Pharynx Lips/Teeth/Gums: No lesions, dentition age appropriate Oral Mucosa: Moist, pink Tongue/Floor of Mouth: Tongue: intact, full range of motion; Floor of mouth: no lesions Palate/Uvula: Intact, mobile Tonsils: Unremarkable Posterior Pharynx: Normal Pharyngeal Walls: Intact, mobile Larynx:  Nasopharynx:   Cervical Neck: No palpable masses Salivary Glands: No masses, tenderness or enlargement Thyroid : No palpable masses  Lymphatic Neck Nodes: No palpable nodes   Respiratory/Cardiac      Neuro/Psych Cranial Nerves:  Orientation: Age appropriate Mood & Affect: Age appropriate  GI/GU   Skin No facial lesions  Musculoskeletal   Extremities    PROCEDURE: Procedures  Alm Ada,  MD [phone]

## 2023-12-25 ENCOUNTER — Ambulatory Visit (INDEPENDENT_AMBULATORY_CARE_PROVIDER_SITE_OTHER): Admitting: Family Medicine

## 2023-12-25 VITALS — BP 110/78 | Ht 74.0 in | Wt 183.0 lb

## 2023-12-25 DIAGNOSIS — S76311A Strain of muscle, fascia and tendon of the posterior muscle group at thigh level, right thigh, initial encounter: Secondary | ICD-10-CM | POA: Diagnosis present

## 2023-12-25 NOTE — Progress Notes (Cosign Needed)
 PCP: Ezell Hollow, MD  Subjective:   HPI: Patient is a 63 y.o. male here for right hip pain.  Reports pain has been ongoing for at least a year.  Pain primarily posterior right hip.  No inciting injury or trauma.  Has been going to PT x 4 months with some improvement.  Regularly works out at [employer], has been unable to do exercises such as split lunges.  Occasionally takes ibuprofen but does not like to take medication.  Has been icing the area.  No numbness/tingling or radiation.  Past Medical History:  Diagnosis Date   Acquired hypothyroidism 11/18/2014   Annual physical exam 03/01/2020   Anxiety    Benign essential hypertension 11/18/2014   CAD (coronary artery disease)    CAD in native artery 10/23/2018   Chronic insomnia 11/18/2014   Dupuytren's contracture of left hand 11/25/2019   GERD (gastroesophageal reflux disease)    GERD without esophagitis 10/23/2018   Hyperlipidemia    Hypertension    no meds now   Hypertensive urgency 09/28/2013   Hypothyroidism    LBBB (left bundle branch block) 09/29/2013   Left bundle branch block    OSA (obstructive sleep apnea)    PCP NOTES >>>>>>>>>>>>>> 03/02/2020   Retinal detachment 06/2018   Right   Retinal detachment, right 11/25/2019   SCC (squamous cell carcinoma)    Seasonal allergies    Thrombocytopenia (HCC)--history of, reports extensive w/u (early 2000, ITP?) 11/29/2020   Thyroid  disease     Current Outpatient Medications on File Prior to Visit  Medication Sig Dispense Refill   aspirin  EC 81 MG tablet Take 81 mg by mouth daily.     azelastine  (ASTELIN ) 0.1 % nasal spray Place 2 sprays into both nostrils at bedtime as needed for rhinitis or allergies. Use in each nostril as directed 30 mL 12   Cholecalciferol (VITAMIN D) 125 MCG (5000 UT) CAPS Take 5,000 Units by mouth daily.     esomeprazole  (NEXIUM ) 40 MG capsule Take 1 capsule (40 mg total) by mouth daily. Due for appt 12/2023 30 capsule 1   ezetimibe   (ZETIA ) 10 MG tablet Take 1 tablet (10 mg total) by mouth daily. 90 tablet 3   fluorouracil (EFUDEX) 5 % cream Apply 1 Application topically daily.     fluticasone (FLONASE) 50 MCG/ACT nasal spray Place 1 spray into both nostrils as needed for allergies or rhinitis.     hydrocortisone 2.5 % lotion Apply 1 application topically as needed (rocesea).     ketoconazole (NIZORAL) 2 % cream Apply 1 Application topically daily.     levothyroxine  (SYNTHROID ) 112 MCG tablet Take 1 tablet (112 mcg total) by mouth daily before breakfast. 90 tablet 1   magnesium  oxide (MAG-OX) 400 MG tablet Take 400 mg by mouth daily.     metroNIDAZOLE  (METROGEL ) 0.75 % gel Apply 1 application. topically as needed (rash).     Multiple Vitamin (MULTIVITAMIN WITH MINERALS) TABS tablet Take 1 tablet by mouth daily. Unknown stregnth     Omega-3 1000 MG CAPS Take 3,800 mg by mouth daily.     pravastatin  (PRAVACHOL ) 40 MG tablet Take 1 tablet (40 mg total) by mouth at bedtime. 30 tablet 5   zolpidem  (AMBIEN  CR) 12.5 MG CR tablet Take 1 tablet (12.5 mg total) by mouth at bedtime as needed. for sleep (Patient taking differently: Take 12.5 mg by mouth at bedtime as needed for sleep. for sleep) 90 tablet 1   No current facility-administered  medications on file prior to visit.    Past Surgical History:  Procedure Laterality Date   BASAL CELL CARCINOMA EXCISION N/A 10/30/2020   Procedure: Excision of scalp squamous cell carcinoma with frozen section, application of integra;  Surgeon: Barb Bonito, MD;  Location: [facility];  Service: Plastics;  Laterality: N/A;  1 hour, please   COLONOSCOPY  01/15/2013   .   COLONOSCOPY  11/27/2015   [facility]. Loyal Ruffing, MD. Diverticulosis of the whole colon, ascending colon and descending colon. Grade 2 internal hemorrhoids. Repeat in 5 years.   DUPUYTREN CONTRACTURE RELEASE Left 05/2016   FASCIECTOMY Left 07/11/2016   Procedure: FASCIECTOMY left index possible  joint release;  Surgeon: Lyanne Sample, MD;  Location: [facility];  Service: Orthopedics;  Laterality: Left;  FASCIECTOMY left index possible joint release   KNEE SURGERY     Left eye retna repaired      MOHS SURGERY  2017   NASAL FRACTURE SURGERY     RETINAL DETACHMENT SURGERY Right 06/2018   TOE SURGERY     TONSILLECTOMY      Allergies  Allergen Reactions   Dust Mite Extract    Mold Extract [Trichophyton]    Tetracyclines & Related Other (See Comments)    Caused liver enzymes to go up   Sulfa Antibiotics Nausea And Vomiting and Nausea Only    BP 110/78   Ht 6\' 2"  (1.88 m)   Wt 183 lb (83 kg)   BMI 23.50 kg/m       No data to display              No data to display              Objective:  Physical Exam:  Gen: NAD, comfortable in exam room MSK: Low back\right leg: -No erythema, ecchymosis or deformity -Nontender to palpation over lumbar spinous process -TTP over ischial tuberosity and piriformis/external rotators -Mild discomfort in groin with internal/external rotation of right leg -5/5 hip flexion and knee flexion/extension bilaterally -Negative Thomas, FABER and FADIR -Mild pain with resisted knee flexion at 90 and 30 degrees   Assessment & Plan:  1.  Right gluteal pain: Secondary to proximal hamstring strain, grade 1 with likely compensatory involvement of hip external rotators. Persistent symptoms despite 4 months of formal PT with modalities, will provide targeted home exercises including Askling protocol. Discussed options, patient agreeable to shockwave therapy over both proximal hamstring and external rotators.  Anticipate 4-6 sessions, follow-up in 1 week for repeat shockwave.  Procedure: ECSWT Indications: Right hamstring strain   Procedure Details Consent: Risks of procedure as well as the alternatives and risks of each were explained to the patient.  Written consent for procedure obtained. Time Out: Verified patient  identification, verified procedure, site was marked, verified correct patient position, medications/allergies/relevent history reviewed.  The area was cleaned with alcohol swab.     The right hip external rotators and proximal hamstring was targeted for Extracorporeal shockwave therapy.    Preset: S/p muscle injury Power Level: 100  Frequency: 14 hz Impulse/cycles: 3000 Head size: Large   Patient tolerated procedure well without immediate complications

## 2023-12-25 NOTE — Patient Instructions (Signed)
 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 We started shockwave therapy today. Continue working with [place]. Do the home exercises daily.

## 2023-12-26 ENCOUNTER — Telehealth: Payer: Self-pay

## 2023-12-26 DIAGNOSIS — K219 Gastro-esophageal reflux disease without esophagitis: Secondary | ICD-10-CM

## 2023-12-26 NOTE — Telephone Encounter (Signed)
 Referral to GI per Dr. Krasowski.

## 2024-01-01 ENCOUNTER — Ambulatory Visit (INDEPENDENT_AMBULATORY_CARE_PROVIDER_SITE_OTHER): Payer: Self-pay | Admitting: Family Medicine

## 2024-01-01 DIAGNOSIS — S76311D Strain of muscle, fascia and tendon of the posterior muscle group at thigh level, right thigh, subsequent encounter: Secondary | ICD-10-CM

## 2024-01-01 DIAGNOSIS — M25551 Pain in right hip: Secondary | ICD-10-CM

## 2024-01-02 ENCOUNTER — Encounter: Payer: Self-pay | Admitting: Family Medicine

## 2024-01-02 NOTE — Progress Notes (Signed)
 Patient returns for second shockwave treatment. He has felt good improvement so far in his pain with first treatment.  Procedure: ECSWT Indications:  right hamstring strain, external rotator strain   Procedure Details Consent: Risks of procedure as well as the alternatives and risks of each were explained to the patient.  Written consent for procedure obtained. Time Out: Verified patient identification, verified procedure, site was marked, verified correct patient position, medications/allergies/relevent history reviewed.  The area was cleaned with alcohol swab.     The right hip external rotators and proximal hamstring was targeted for Extracorporeal shockwave therapy.    Preset: trochanteric bursitis Power Level: 110 Frequency: 14hz  Impulse/cycles: 3000 Head size: large   Patient tolerated procedure well without immediate complications

## 2024-01-05 ENCOUNTER — Other Ambulatory Visit: Payer: Self-pay

## 2024-01-05 NOTE — Telephone Encounter (Signed)
LVM per DPR- per Dr. Vanetta Shawl note regarding normal Stress test results. Encouraged to call with any questions. Routed to PCP.

## 2024-01-08 ENCOUNTER — Encounter: Payer: Self-pay | Admitting: Family Medicine

## 2024-01-08 ENCOUNTER — Ambulatory Visit (INDEPENDENT_AMBULATORY_CARE_PROVIDER_SITE_OTHER): Payer: Self-pay | Admitting: Family Medicine

## 2024-01-08 VITALS — Ht 74.0 in

## 2024-01-08 DIAGNOSIS — S76311D Strain of muscle, fascia and tendon of the posterior muscle group at thigh level, right thigh, subsequent encounter: Secondary | ICD-10-CM

## 2024-01-08 DIAGNOSIS — M25551 Pain in right hip: Secondary | ICD-10-CM

## 2024-01-08 NOTE — Progress Notes (Signed)
 Patient returns for third shockwave treatment. He continues to improve. Doing his home exercises - improving overall but does feel temporarily more pain while doing them.  Procedure: ECSWT Indications:  right hamstring strain, external rotator strain   Procedure Details Consent: Risks of procedure as well as the alternatives and risks of each were explained to the patient.  Written consent for procedure obtained. Time Out: Verified patient identification, verified procedure, site was marked, verified correct patient position, medications/allergies/relevent history reviewed.  The area was cleaned with alcohol swab.     The right hip external rotators and proximal hamstring were targeted for Extracorporeal shockwave therapy.    Preset: status post muscle injury Power Level: 120 Frequency: 16 Impulse/cycles: 3000 Head size: large   Patient tolerated procedure well without immediate complications

## 2024-01-20 ENCOUNTER — Ambulatory Visit (INDEPENDENT_AMBULATORY_CARE_PROVIDER_SITE_OTHER): Payer: Self-pay | Admitting: Family Medicine

## 2024-01-20 ENCOUNTER — Encounter: Payer: Self-pay | Admitting: Family Medicine

## 2024-01-20 DIAGNOSIS — S76311D Strain of muscle, fascia and tendon of the posterior muscle group at thigh level, right thigh, subsequent encounter: Secondary | ICD-10-CM

## 2024-01-20 NOTE — Progress Notes (Signed)
 FOLLOW-UP VISIT:  Shockwave Therapy Procedure   NAME:  Peter Spencer DOB:  [birth year] MR#: [id number] DATE OF VISIT:  01/20/2024  Encounter:   Peter Spencer comes in for a follow up evaluation and 4th Radial Extracorporeal Shockwave therapy (ESWT) treatment to the  Rt hamstring.  Peter Spencer reports feeling about 90% improved overall  He has been active with home exercises   ESWT Procedure Note: Treatment #4  Diagnosis: Rt hamstring strain  Procedure Details  Consent for the procedure was obtained. Time out was performed. The anatomical landmarks and target areas for the procedure were identified.   PROCEDURE: ESWT was discussed with the patient in detail.  The risk and benefits were explained.  The point of maximal tenderness was identified and the oscillator probe was applied with moderate pressure. Treatment adjusted as mediated by patient feedback/pain.  Rt hip external rotators, proximal hamstring was targeted for ESWT  Preset: status post muscle injury Power level: 120 MJ Frequency: 16 Hz Impulses: 3000 Head size: large   Complications:  None; patient tolerated the procedure well.  ASSESSMENT/PLAN: 1. Strain of right hamstring, subsequent encounter  Plan:   ESWT completed today as noted above Return in 1-2 weeks for ESWT procedure #5 - he will see how he is feeling over the next week or so and decide if he wants to try another session.  Discussed average treatment is 4-6 sessions for his condition. Procedure aftercare reviewed Recommended avoiding strenuous activities and using OTC analgesia prn.

## 2024-01-20 NOTE — Patient Instructions (Signed)

## 2024-01-27 ENCOUNTER — Ambulatory Visit (INDEPENDENT_AMBULATORY_CARE_PROVIDER_SITE_OTHER): Payer: Self-pay | Admitting: Family Medicine

## 2024-01-27 ENCOUNTER — Encounter: Payer: Self-pay | Admitting: Family Medicine

## 2024-01-27 VITALS — Ht 74.0 in

## 2024-01-27 DIAGNOSIS — S76311D Strain of muscle, fascia and tendon of the posterior muscle group at thigh level, right thigh, subsequent encounter: Secondary | ICD-10-CM

## 2024-01-27 NOTE — Progress Notes (Signed)
 FOLLOW-UP VISIT:  Shockwave Therapy Procedure   NAME:  Peter Spencer DOB:  1961/04/04 MR#: [id number] DATE OF VISIT:  01/27/2024  Encounter:   Peter Spencer comes in for a follow up evaluation and 5th Radial Extracorporeal Shockwave therapy (ESWT) treatment to the  Rt hamstring.  Peter Spencer reports feeling some improvement from last session He has been active with /home exercises PT.    ESWT Procedure Note: Treatment #5  Diagnosis: Rt hamstring strain  Procedure Details  Consent for the procedure was obtained. Time out was performed. The anatomical landmarks and target areas for the procedure were identified.   PROCEDURE: ESWT was discussed with the patient in detail.  The risk and benefits were explained.  The point of maximal tenderness was identified and the oscillator probe was applied with moderate pressure. Treatment adjusted as mediated by patient feedback/pain.  Right hip external rotators, proximal hamstring was targeted for ESWT  Preset: Status post muscle injury Power level: 120 MJ Frequency: 16 hz Impulses: 3000 Head size: Large   Complications:  None; patient tolerated the procedure well.  Assessment/plan: 1. Strain of right hamstring, subsequent encounter   Plan:   ESWT completed today as noted above Patient completed his fifth treatment today.  He plans to go to the beach over the next month.  He plans to continue his home exercises and determine if he would like further treatment at that time  Procedure aftercare reviewed Recommended avoiding strenuous activities and using OTC analgesia prn. Follow-up prn for future treatments if pain persists

## 2024-01-27 NOTE — Patient Instructions (Signed)

## 2024-02-12 ENCOUNTER — Encounter: Payer: Self-pay | Admitting: Gastroenterology

## 2024-02-12 ENCOUNTER — Ambulatory Visit: Admitting: Gastroenterology

## 2024-02-12 VITALS — BP 130/70 | HR 94 | Ht 75.0 in | Wt 187.1 lb

## 2024-02-12 DIAGNOSIS — K2100 Gastro-esophageal reflux disease with esophagitis, without bleeding: Secondary | ICD-10-CM

## 2024-02-12 MED ORDER — ESOMEPRAZOLE MAGNESIUM 40 MG PO CPDR: 40.0000 mg | DELAYED_RELEASE_CAPSULE | Freq: Every day | ORAL | 1 refills | Status: DC

## 2024-02-12 NOTE — Progress Notes (Signed)
 Chief Complaint:GERD Primary GI Doctor:Dr. 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  Last seen in GI office on 11/12/22 by Dr. Jerie for constipation and change in bowel habits.   Interval History    Patient has history of GERD and taking esomeprazole  40 mg po daily. He reports he ran out of medication 3 weeks ago and has not had any issues. He reports he was having epigastric pain and discussed with his cardiologist Dr. Bernie who did cardiac workup to make sure not cardiac related. The episodes would occur about once a month.  Patient denies dysphagia. Patient does have LPR symptoms of hoarseness and clearing of throat. He cut out alcohol and fried foods as this made his symptoms worse. He wanted to discuss today pros and cons of long term PPI medication. He felt the pantoprazole  given after EGD did not help as much as the esomeprazole  he recently was switched to.  Wt Readings from Last 3 Encounters:  02/12/24 187 lb 2 oz (84.9 kg)  12/25/23 183 lb (83 kg)  12/03/23 183 lb 9.6 oz (83.3 kg)    Past Medical History:  Diagnosis Date   Acquired hypothyroidism 11/18/2014   Annual physical exam 03/01/2020   Anxiety    Benign essential hypertension 11/18/2014   CAD (coronary artery disease)    CAD in native artery 10/23/2018   Chronic insomnia 11/18/2014   Dupuytren's contracture of left hand 11/25/2019   GERD (gastroesophageal reflux disease)    GERD without esophagitis 10/23/2018   Hyperlipidemia    Hypertension    no meds now   Hypertensive urgency 09/28/2013   Hypothyroidism    LBBB (left bundle branch block) 09/29/2013   Left bundle branch block    OSA (obstructive sleep apnea)    PCP NOTES >>>>>>>>>>>>>> 03/02/2020   Retinal detachment 06/2018   Right   Retinal detachment, right 11/25/2019   SCC (squamous cell carcinoma)    Seasonal  allergies    Thrombocytopenia (HCC)--history of, reports extensive w/u (early 2000, ITP?) 11/29/2020   Thyroid  disease     Past Surgical History:  Procedure Laterality Date   BASAL CELL CARCINOMA EXCISION N/A 10/30/2020   Procedure: Excision of scalp squamous cell carcinoma with frozen section, application of integra;  Surgeon: Elisabeth Craig RAMAN, MD;  Location: [facility];  Service: Plastics;  Laterality: N/A;  1 hour, please   COLONOSCOPY  01/15/2013   .   COLONOSCOPY  11/27/2015   [facility]. Gunnar Daniels, MD. Diverticulosis of the whole colon, ascending colon and descending colon. Grade 2 internal hemorrhoids. Repeat in 5 years.   DUPUYTREN CONTRACTURE RELEASE Left 05/2016   FASCIECTOMY Left 07/11/2016   Procedure: FASCIECTOMY left index possible joint release;  Surgeon: Arley Curia, MD;  Location: [facility];  Service: Orthopedics;  Laterality: Left;  FASCIECTOMY left index possible joint release   KNEE SURGERY     Left eye retna repaired      MOHS SURGERY  2017   NASAL FRACTURE SURGERY     RETINAL DETACHMENT SURGERY Right 06/2018   TOE SURGERY     TONSILLECTOMY      Current Outpatient Medications  Medication Sig Dispense Refill   aspirin  EC 81 MG tablet Take 81 mg by mouth daily.     azelastine  (ASTELIN ) 0.1 % nasal spray Place 2 sprays into both nostrils at  bedtime as needed for rhinitis or allergies. Use in each nostril as directed 30 mL 12   Cholecalciferol (VITAMIN D) 125 MCG (5000 UT) CAPS Take 5,000 Units by mouth daily.     ezetimibe  (ZETIA ) 10 MG tablet Take 1 tablet (10 mg total) by mouth daily. 90 tablet 3   fluorouracil (EFUDEX) 5 % cream Apply 1 Application topically daily.     fluticasone (FLONASE) 50 MCG/ACT nasal spray Place 1 spray into both nostrils as needed for allergies or rhinitis.     ketoconazole (NIZORAL) 2 % cream Apply 1 Application topically daily.     levothyroxine  (SYNTHROID ) 112 MCG tablet Take 1 tablet (112  mcg total) by mouth daily before breakfast. 90 tablet 1   magnesium  oxide (MAG-OX) 400 MG tablet Take 400 mg by mouth daily.     metroNIDAZOLE  (METROGEL ) 0.75 % gel Apply 1 application. topically as needed (rash).     Omega-3 1000 MG CAPS Take 3,800 mg by mouth daily.     pravastatin  (PRAVACHOL ) 40 MG tablet Take 1 tablet (40 mg total) by mouth at bedtime. 30 tablet 5   zolpidem  (AMBIEN  CR) 12.5 MG CR tablet Take 1 tablet (12.5 mg total) by mouth at bedtime as needed. for sleep (Patient taking differently: Take 12.5 mg by mouth at bedtime as needed for sleep. for sleep) 90 tablet 1   esomeprazole  (NEXIUM ) 40 MG capsule Take 1 capsule (40 mg total) by mouth daily. Due for appt 12/2023 30 capsule 1   hydrocortisone 2.5 % lotion Apply 1 application topically as needed (rocesea). (Patient not taking: Reported on 02/12/2024)     Multiple Vitamin (MULTIVITAMIN WITH MINERALS) TABS tablet Take 1 tablet by mouth daily. Unknown stregnth (Patient not taking: Reported on 02/12/2024)     No current facility-administered medications for this visit.    Allergies as of 02/12/2024 - Review Complete 02/12/2024  Allergen Reaction Noted   Dust mite extract  06/10/2019   Mold extract [trichophyton]  06/10/2019   Tetracyclines & related Other (See Comments) 07/05/2016   Sulfa antibiotics Nausea And Vomiting and Nausea Only 09/29/2013    Family History  Problem Relation Age of Onset   Pancreatic cancer Mother    Atrial fibrillation Father    Dementia Father    Parkinsonism Father    CAD Other    Stroke Neg Hx    Diabetes Mellitus II Neg Hx    Colon cancer Neg Hx    Prostate cancer Neg Hx    Liver disease Neg Hx    Esophageal cancer Neg Hx     Review of Systems:    Constitutional: No weight loss, fever, chills, weakness or fatigue HEENT: Eyes: No change in vision               Ears, Nose, Throat:  No change in hearing or congestion Skin: No rash or itching Cardiovascular: No chest pain, chest pressure  or palpitations   Respiratory: No SOB or cough Gastrointestinal: See HPI and otherwise negative Genitourinary: No dysuria or change in urinary frequency Neurological: No headache, dizziness or syncope Musculoskeletal: No new muscle or joint pain Hematologic: No bleeding or bruising Psychiatric: No history of depression or anxiety    Physical Exam:  Vital signs: BP 130/70 (BP Location: Left Arm, Patient Position: Sitting, Cuff Size: Normal)   Pulse 94   Ht 6' 3 (1.905 m)   Wt 187 lb 2 oz (84.9 kg)   BMI 23.39 kg/m   Constitutional: Pleasant male appears to  be in NAD, Well developed, Well nourished, alert and cooperative Throat: Oral cavity and pharynx without inflammation, swelling or lesion.  Respiratory: Respirations even and unlabored. Lungs clear to auscultation bilaterally.   No wheezes, crackles, or rhonchi.  Cardiovascular: Normal S1, S2. Regular rate and rhythm. No peripheral edema, cyanosis or pallor.  Gastrointestinal:  Soft, nondistended, nontender. No rebound or guarding. Normal bowel sounds. No appreciable masses or hepatomegaly. Rectal:  Not performed.  Msk:  Symmetrical without gross deformities. Without edema, no deformity or joint abnormality.  Neurologic:  Alert and  oriented x4;  grossly normal neurologically.  Skin:   Dry and intact without significant lesions or rashes. Psychiatric: Oriented to person, place and time. Demonstrates good judgement and reason without abnormal affect or behaviors.  RELEVANT LABS AND IMAGING: CBC    Latest Ref Rng & Units 03/01/2023    4:03 PM 08/27/2022    5:01 PM 07/05/2021    8:18 AM  CBC  WBC 4.0 - 10.5 K/uL 4.3  4.1  3.5   Hemoglobin 13.0 - 17.0 g/dL 86.2  85.5  86.2   Hematocrit 39.0 - 52.0 % 38.2  40.5  40.6   Platelets 150 - 400 K/uL 112  130.0  114.0      CMP     Latest Ref Rng & Units 12/03/2023   11:00 AM 09/26/2023    9:31 AM 03/01/2023    4:03 PM  CMP  Glucose 70 - 99 mg/dL  87  96   BUN 6 - 23 mg/dL  20  24    Creatinine 9.59 - 1.50 mg/dL  8.93  8.99   Sodium 864 - 145 mEq/L  137  132   Potassium 3.5 - 5.1 mEq/L  4.4  4.1   Chloride 96 - 112 mEq/L  104  98   CO2 19 - 32 mEq/L  27  25   Calcium  8.4 - 10.5 mg/dL  8.5  8.5   Total Protein 6.0 - 8.3 g/dL  6.3    Total Bilirubin 0.2 - 1.2 mg/dL  0.7    Alkaline Phos 39 - 117 U/L  54    AST 0 - 40 IU/L 17  16    ALT 0 - 44 IU/L 21  17       Lab Results  Component Value Date   TSH 2.68 09/26/2023  12/24 echo-  Left ventricular ejection fraction, by estimation, is 45 to 50%.  12/09/2022 colonoscopy, 11/2025 - Hemorrhoids found on perianal exam. - Two 3 to 5 mm polyps in the ascending colon, removed with a cold snare. Resected and retrieved. - Two 2 to 6 mm polyps in the descending colon, removed with a cold snare. Resected and retrieved. - Two 2 to 3 mm polyps at the recto- sigmoid colon, removed with a cold snare. Resected and retrieved. - Diverticulosis in the entire examined colon. - Non- bleeding internal hemorrhoids. Path:  Diagnosis 1. Surgical [P], gastritis bx - ANTRAL MUCOSA WITH FEATURES OF BOTH MILD CHRONIC INACTIVE GASTRITIS AND CHEMICAL/REACTIVE CHANGE. - OXYNTIC MUCOSA WITH NO SIGNIFICANT PATHOLOGY. - NO HELICOBACTER PYLORI ORGANISMS IDENTIFIED ON H&E STAINED SLIDE. 2. Surgical [P], colon, ascending, polyp (2) - TUBULAR ADENOMA, FRAGMENTS. 3. Surgical [P], colon, descending, recto-sigmoid, polyp (4) - SESSILE SERRATED POLYP WITHOUT DYSPLASIA, 1 FRAGMENT. - SEPARATE FRAGMENTS OF COLONIC MUCOSA WITH MILD SUPERFICIAL HYPERPLASTIC CHANGE AND SMALL LYMPHOID AGGREGATE 12/09/22 EGD - LA Grade A reflux esophagitis with no bleeding. - Gastroesophageal flap valve classified as Hill Grade II (  fold present, opens with respiration) . - Gastritis. Biopsied. - Normal gastric fundus, gastric body and antrum. - Normal examined duodenum. 04/04/22 colonoscopy Medium hemorrhoids Otherwise normal exam, prep is fair.  Assessment: Encounter Diagnosis   Name Primary?   Gastroesophageal reflux disease with esophagitis without hemorrhage Yes     63 year male patient with history of GERD complicated with esophagitis grade A, patient has atypical symptoms of hoarseness and globus sensation as well as epigastric pain that is well-managed if he follows strict GERD diet and on PPI therapy daily.  The symptoms are not daily however when they do occur they are severe and disruptive.  We discussed patient restarting esomeprazole  daily along with continuing a strict GERD diet.  Patient agrees to plan.  If the medication is not cost effective he will let me know and we can discuss switching to something else.    Patient up-to-date on colonoscopy with history of polyps, next colonoscopy due 11/2025.  Plan: -Continue Esomeprazole  40 mg po daily , refilled - Continue GERD diet, no late meals 3-4 hours before lying down - Recall colonoscopy 11/2025  Thank you for the courtesy of this consult. Please call me with any questions or concerns.   Yocheved Depner, FNP-C [facility] 02/12/2024, 11:51 AM  Cc: Amon Aloysius BRAVO, MD

## 2024-02-12 NOTE — Patient Instructions (Signed)
 Recommend GERD diet, no late meals 3-4 hours before bed Restart Nexium  40 mg 1 tablet take 45 mins before breakfast

## 2024-02-19 ENCOUNTER — Other Ambulatory Visit: Payer: Self-pay | Admitting: Internal Medicine

## 2024-03-07 ENCOUNTER — Other Ambulatory Visit: Payer: Self-pay | Admitting: Gastroenterology

## 2024-03-07 DIAGNOSIS — K2100 Gastro-esophageal reflux disease with esophagitis, without bleeding: Secondary | ICD-10-CM

## 2024-03-08 ENCOUNTER — Other Ambulatory Visit: Payer: Self-pay

## 2024-03-08 DIAGNOSIS — K2100 Gastro-esophageal reflux disease with esophagitis, without bleeding: Secondary | ICD-10-CM

## 2024-03-08 MED ORDER — ESOMEPRAZOLE MAGNESIUM 40 MG PO CPDR: 40.0000 mg | DELAYED_RELEASE_CAPSULE | Freq: Every day | ORAL | 0 refills | Status: DC

## 2024-03-08 NOTE — Progress Notes (Signed)
 Rx refill

## 2024-03-15 ENCOUNTER — Other Ambulatory Visit: Payer: Self-pay | Admitting: Internal Medicine

## 2024-03-17 HISTORY — PX: DUPUYTREN CONTRACTURE RELEASE: SHX1478

## 2024-03-22 ENCOUNTER — Telehealth: Payer: Self-pay | Admitting: Internal Medicine

## 2024-03-23 MED ORDER — ZOLPIDEM TARTRATE ER 12.5 MG PO TBCR: 12.5000 mg | EXTENDED_RELEASE_TABLET | Freq: Every evening | ORAL | 0 refills | Status: DC | PRN

## 2024-03-23 NOTE — Telephone Encounter (Signed)
 Requesting: Ambien   Contract: 11/26/22 UDS: Ambien  only Last Visit: 09/22/23 Next Visit: None Last Refill: 09/22/23 #90 and 1RF  Message sent to Pt on 03/15/24 informing he is due for appt  Please Advise

## 2024-03-23 NOTE — Telephone Encounter (Signed)
 PDMP okay, Rx sent

## 2024-04-16 ENCOUNTER — Other Ambulatory Visit: Payer: Self-pay | Admitting: Internal Medicine

## 2024-05-03 ENCOUNTER — Ambulatory Visit (INDEPENDENT_AMBULATORY_CARE_PROVIDER_SITE_OTHER): Admitting: Internal Medicine

## 2024-05-03 ENCOUNTER — Encounter: Payer: Self-pay | Admitting: Internal Medicine

## 2024-05-03 VITALS — BP 122/66 | HR 81 | Temp 98.0°F | Resp 16 | Ht 74.0 in | Wt 186.0 lb

## 2024-05-03 DIAGNOSIS — B070 Plantar wart: Secondary | ICD-10-CM

## 2024-05-03 DIAGNOSIS — Z125 Encounter for screening for malignant neoplasm of prostate: Secondary | ICD-10-CM | POA: Diagnosis not present

## 2024-05-03 DIAGNOSIS — I10 Essential (primary) hypertension: Secondary | ICD-10-CM | POA: Diagnosis not present

## 2024-05-03 DIAGNOSIS — Z23 Encounter for immunization: Secondary | ICD-10-CM | POA: Diagnosis not present

## 2024-05-03 DIAGNOSIS — R5383 Other fatigue: Secondary | ICD-10-CM

## 2024-05-03 DIAGNOSIS — E039 Hypothyroidism, unspecified: Secondary | ICD-10-CM

## 2024-05-03 DIAGNOSIS — F439 Reaction to severe stress, unspecified: Secondary | ICD-10-CM

## 2024-05-03 DIAGNOSIS — K2100 Gastro-esophageal reflux disease with esophagitis, without bleeding: Secondary | ICD-10-CM

## 2024-05-03 MED ORDER — ESOMEPRAZOLE MAGNESIUM 40 MG PO CPDR: 40.0000 mg | DELAYED_RELEASE_CAPSULE | Freq: Two times a day (BID) | ORAL | 0 refills | Status: DC

## 2024-05-03 NOTE — Progress Notes (Signed)
 "  Subjective:    Patient ID: Peter Spencer, male    DOB: 02/06/1961, 63 y.o.   MRN: [id number]  DOS:  05/03/2024 Discussed the use of AI scribe software for clinical note transcription with the patient, who gave verbal consent to proceed.  History of Present Illness Follow-up  Notes from cardiology, ENT and orthopedics reviewed  Plantar lesion and foot pain - Painful plantar wart on the left foot - Describes pain as severe ('killing me') - No prior plantar warts since high school - Seeking evaluation and treatment  Gastroesophageal reflux symptoms - Takes esomeprazole  40 mg, two tablets in the morning for symptom control - Stopped alcohol consumption to help manage symptoms  Fatigue and exercise intolerance - Fatigue present for the past three to five months - Feels unusually tired at the end of the day -No shortness of breath with normal activities, not able to exercise much due to hip issues, unknown if he has DOE. - No leg swelling - Recent stress test negative for heart disease  Right hip pain and functional limitation - Undergoing physical therapy for right hip pain - Hip pain is 90% improved - Hip issue limits ability to perform cardio exercises  Chronic thrombocytopenia, h/o   Hypothyroidism - Takes levothyroxine  for hypothyroidism  Chronic constipation - Takes Miralax daily for constipation   Review of Systems See above   Past Medical History:  Diagnosis Date   Acquired hypothyroidism 11/18/2014   Annual physical exam 03/01/2020   Anxiety    Benign essential hypertension 11/18/2014   CAD (coronary artery disease)    CAD in native artery 10/23/2018   Chronic insomnia 11/18/2014   Dupuytren's contracture of left hand 11/25/2019   GERD (gastroesophageal reflux disease)    GERD without esophagitis 10/23/2018   Hyperlipidemia    Hypertension    no meds now   Hypertensive urgency 09/28/2013   Hypothyroidism    LBBB (left bundle branch block)  09/29/2013   Left bundle branch block    OSA (obstructive sleep apnea)    PCP NOTES >>>>>>>>>>>>>> 03/02/2020   Retinal detachment 06/2018   Right   Retinal detachment, right 11/25/2019   SCC (squamous cell carcinoma)    Seasonal allergies    Thrombocytopenia (HCC)--history of, reports extensive w/u (early 2000, ITP?) 11/29/2020   Thyroid  disease     Past Surgical History:  Procedure Laterality Date   BASAL CELL CARCINOMA EXCISION N/A 10/30/2020   Procedure: Excision of scalp squamous cell carcinoma with frozen section, application of integra;  Surgeon: Elisabeth Craig RAMAN, MD;  Location: [facility];  Service: Plastics;  Laterality: N/A;  1 hour, please   COLONOSCOPY  01/15/2013   .   COLONOSCOPY  11/27/2015   [facility]. Gunnar Daniels, MD. Diverticulosis of the whole colon, ascending colon and descending colon. Grade 2 internal hemorrhoids. Repeat in 5 years.   DUPUYTREN CONTRACTURE RELEASE Left 05/2016   DUPUYTREN CONTRACTURE RELEASE Left 03/17/2024   FASCIECTOMY Left 07/11/2016   Procedure: FASCIECTOMY left index possible joint release;  Surgeon: Arley Curia, MD;  Location:  [facility]ERY [facility];  Service: Orthopedics;  Laterality: Left;  FASCIECTOMY left index possible joint release   KNEE SURGERY     Left eye retna repaired      MOHS SURGERY  2017   NASAL FRACTURE SURGERY     RETINAL DETACHMENT SURGERY Right 06/2018   TOE SURGERY     TONSILLECTOMY      Current Outpatient Medications  Medication  Instructions   aspirin  EC 81 mg, Daily   azelastine  (ASTELIN ) 0.1 % nasal spray 2 sprays, Each Nare, At bedtime PRN, Use in each nostril as directed   esomeprazole  (NEXIUM ) 40 mg, Oral, 2 times daily before meals   ezetimibe  (ZETIA ) 10 mg, Oral, Daily   fluorouracil (EFUDEX) 5 % cream 1 Application, Daily   fluticasone (FLONASE) 50 MCG/ACT nasal spray 1 spray, As needed   hydrocortisone 2.5 % lotion 1 application , As needed   ketoconazole (NIZORAL) 2 %  cream 1 Application, Daily   levothyroxine  (SYNTHROID ) 112 mcg, Oral, Daily before breakfast   magnesium  oxide (MAG-OX) 400 mg, Daily   metroNIDAZOLE  (METROGEL ) 0.75 % gel 1 application , As needed   Multiple Vitamin (MULTIVITAMIN WITH MINERALS) TABS tablet 1 tablet, Daily   Omega-3 3,800 mg, Daily   pravastatin  (PRAVACHOL ) 40 MG tablet TAKE 1 TABLET BY MOUTH EVERYDAY AT BEDTIME   Vitamin D  5,000 Units, Daily   zolpidem  (AMBIEN  CR) 12.5 mg, Oral, At bedtime PRN       Objective:   Physical Exam BP 122/66   Pulse 81   Temp 98 F (36.7 C) (Oral)   Resp 16   Ht 6' 2 (1.88 m)   Wt 186 lb (84.4 kg)   SpO2 98%   BMI 23.88 kg/m  General:   Well developed, NAD, BMI noted. HEENT:  Normocephalic . Face symmetric, atraumatic Lungs:  CTA B Normal respiratory effort, no intercostal retractions, no accessory muscle use. Heart: RRR,  no murmur.  Lower extremities: Left plantar area has a ingrown plantar wart without redness or discharge Skin: Not pale. Not jaundice Neurologic:  alert & oriented X3.  Speech normal, gait appropriate for age and unassisted Psych--  Cognition and judgment appear intact.  Cooperative with normal attention span and concentration.  Behavior appropriate. No anxious or depressed appearing.      Assessment     ASSESSMENT (new patient 11/2019) HTN, history of hypertensive urgency High cholesterol Thyroid  disease CAD, LBBB (+ LAD disease per coronary CT, neg a stress test 2017) OSA - borderline per pt  Dupuytren's Right retinal detachment 2019 [facility] Rosacea dx ~ 2017, seen @ [place] [facility], increased LFTs w/ tetracycline per chart review Retinal detachment , surgery R 22019, L 05-2020 Thrombocytopenia: w/u in [facility] ~ 1990s, early 2000s, including a BM Bx , ITP ?    Assessment & Plan CAD, LBBB: Saw cardiology 12/03/2023, atypical symptoms, Rx Lexiscan  done 11/2023 - neg High cholesterol: Since the last visit, was on Pravachol , Zetia  was added, follow-up LDL 77,  acceptable levels per cardiology. Dupuytren's contracture: Per Ortho  GERD: LOV GI 02/12/2024, Rx PPIs once daily, sxs  continue and he is taking 2 tabs qd, request a refill.  I agreed to refill it, to take 1 tablet twice a day (before breakfast and at bedtime) and recommend to call GI if symptoms persist. Fatigue,   Fatigue present for 3-5 months, as described above.  Recent stress test negative, possibly multifactorial. ROS benign. Plan: Blood tests to evaluate for anemia, kidney function, potassium levels, B12, folic acid , and thyroid  function. Stress: Related to his daughter's wedding.  Listening therapy provided. Hypothyroidism Managed with levothyroxine .  Check TSH. Constipation Managed with daily Miralax. Plantar wart, left foot Significant pain from plantar wart on left foot.  Referred to podiatrist. Preventive care: Vaccine advice: Flu shot today, recommend a COVID booster.  He thinks he got a pneumonia shot before. Also check a PSA RTC 4 months       "

## 2024-05-03 NOTE — Patient Instructions (Addendum)
 GO TO THE LAB :  Get the blood work    Then, go to the front desk for the checkout Please make an appointment for a checkup in 4 months   STOP BY THE FIRST FLOOR:  get the XR   Will refer you to a podiatrist  You got a flu shot today please consider COVID-vaccine    Please read more detailed instructions below   PLANTAR WART: You have a painful plantar wart on your left foot. -We are referring you to a podiatrist for evaluation and management of the plantar wart.  GASTROESOPHAGEAL REFLUX DISEASE (GERD): You have persistent symptoms of GERD. -Continue taking esomeprazole  40 mg twice daily, once in the morning before breakfast and once at night before bedtime. -Contact a gastroenterologist if your symptoms persist despite the current regimen.  FATIGUE: You have been experiencing fatigue for the past 3-5 months. -We will order blood tests to evaluate for anemia, kidney function, potassium levels, B12, folic acid, and thyroid  function.  HYPOTHYROIDISM: You are managing hypothyroidism with levothyroxine . -We will check your TSH levels to assess your thyroid  function.  CHRONIC CONSTIPATION: You have chronic constipation managed with daily Miralax. -Continue taking Miralax daily as prescribed.                      Contains text generated by [employer].                                 Contains text generated by [employer].

## 2024-05-04 LAB — CBC WITH DIFFERENTIAL/PLATELET
Basophils Absolute: 0.1 K/uL (ref 0.0–0.1)
Basophils Relative: 1.3 % (ref 0.0–3.0)
Eosinophils Absolute: 0.1 K/uL (ref 0.0–0.7)
Eosinophils Relative: 2.3 % (ref 0.0–5.0)
HCT: 43.2 % (ref 39.0–52.0)
Hemoglobin: 14.7 g/dL (ref 13.0–17.0)
Lymphocytes Relative: 24.8 % (ref 12.0–46.0)
Lymphs Abs: 1 K/uL (ref 0.7–4.0)
MCHC: 34 g/dL (ref 30.0–36.0)
MCV: 91.9 fl (ref 78.0–100.0)
Monocytes Absolute: 0.4 K/uL (ref 0.1–1.0)
Monocytes Relative: 9.7 % (ref 3.0–12.0)
Neutro Abs: 2.5 K/uL (ref 1.4–7.7)
Neutrophils Relative %: 61.9 % (ref 43.0–77.0)
Platelets: 120 K/uL — ABNORMAL LOW (ref 150.0–400.0)
RBC: 4.7 Mil/uL (ref 4.22–5.81)
RDW: 13.7 % (ref 11.5–15.5)
WBC: 4 K/uL (ref 4.0–10.5)

## 2024-05-04 LAB — BASIC METABOLIC PANEL WITH GFR
BUN: 19 mg/dL (ref 6–23)
CO2: 28 meq/L (ref 19–32)
Calcium: 8.8 mg/dL (ref 8.4–10.5)
Chloride: 103 meq/L (ref 96–112)
Creatinine, Ser: 1.02 mg/dL (ref 0.40–1.50)
GFR: 77.02 mL/min
Glucose, Bld: 95 mg/dL (ref 70–99)
Potassium: 4.5 meq/L (ref 3.5–5.1)
Sodium: 138 meq/L (ref 135–145)

## 2024-05-04 LAB — B12 AND FOLATE PANEL
Folate: >22.9 ng/mL (ref >5.9)
Vitamin B-12: 352 pg/mL (ref 211–911)

## 2024-05-04 LAB — VITAMIN D 25 HYDROXY (VIT D DEFICIENCY, FRACTURES): VITD: 47.65 ng/mL (ref 30.00–100.00)

## 2024-05-04 LAB — PSA: PSA: 0.53 ng/mL (ref 0.10–4.00)

## 2024-05-04 LAB — TSH: TSH: 2.61 u[IU]/mL (ref 0.35–5.50)

## 2024-05-04 NOTE — Assessment & Plan Note (Signed)
 CAD, LBBB: Saw cardiology 12/03/2023, atypical symptoms, Rx Lexiscan  done 11/2023 - neg High cholesterol: Since the last visit, was on Pravachol , Zetia  was added, follow-up LDL 77, acceptable levels per cardiology. Dupuytren's contracture: Per Ortho  GERD: LOV GI 02/12/2024, Rx PPIs once daily, sxs  continue and he is taking 2 tabs qd, request a refill.  I agreed to refill it, to take 1 tablet twice a day (before breakfast and at bedtime) and recommend to call GI if symptoms persist. Fatigue,   Fatigue present for 3-5 months, as described above.  Recent stress test negative, possibly multifactorial. ROS benign. Plan: Blood tests to evaluate for anemia, kidney function, potassium levels, B12, folic acid, and thyroid  function. Stress: Related to his daughter's wedding.  Listening therapy provided. Hypothyroidism Managed with levothyroxine .  Check TSH. Constipation Managed with daily Miralax. Plantar wart, left foot Significant pain from plantar wart on left foot.  Referred to podiatrist. Preventive care: Vaccine advice: Flu shot today, recommend a COVID booster.  He thinks he got a pneumonia shot before. Also check a PSA RTC 4 months

## 2024-05-05 ENCOUNTER — Ambulatory Visit: Admitting: Podiatry

## 2024-05-05 ENCOUNTER — Ambulatory Visit: Payer: Self-pay | Admitting: Internal Medicine

## 2024-05-05 DIAGNOSIS — B070 Plantar wart: Secondary | ICD-10-CM | POA: Diagnosis not present

## 2024-05-05 DIAGNOSIS — M778 Other enthesopathies, not elsewhere classified: Secondary | ICD-10-CM

## 2024-05-05 NOTE — Progress Notes (Signed)
 Patient presents with complaint of painful lesion plantar lateral aspect of the foot left.  Thinks he might be a wart.  This bother him the past month or 2.  Does not recall any injury to the area.  Painful with walking and wearing shoes.   Physical exam:  General appearance: Pleasant, and in no acute distress. AOx3.  Vascular: Pedal pulses: DP 2/4 bilaterally, PT 2/4 bilaterally. Mild edema lower legs bilaterally. Capillary fill time immediate b/l.  Neurological: Light touch intact feet bilaterally.  Normal Achilles reflex bilaterally.  No clonus or spasticity noted.   Dermatologic:   Verrucous lesion left foot subfifth MTP left.  Skin lines go around the lesion with pinpoint hemorrhages and tenderness on lateral pressure of the lesion.  Skin normal temperature bilaterally.  Skin normal color, tone, and texture bilaterally.   Musculoskeletal: Tenderness plantar lateral aspect fifth MTP left.  No tenderness with range of motion of the fifth MTP.  Tailor's bunion deformity left.  Normal muscle strength lower extremity bilaterally  Diagnosis: 1.Capsulitis mtp 5 LT 2. Plantar verruca LT  Plan: -New patient office visit for evaluation management Level 3.  Modifier 25. -I discussed the plantar verruca treatment and etiology.  Discussed viral etiology.  Discussed possible differential diagnosis of poor keratoma. -Discussed pain around fifth metatarsal phalangeal joint and tailor's bunion deformity left.  Discussed etiology and treatment.  Wear good comfortable shoes with good padding and support.  Sometimes it becomes more painful surgery is required.  Tenderness of the fifth MTP plantar laterally left.  Tailor's bunion deformity left.  Normal muscle strength lower extremity bilaterally -Applied Salinocaine compound to lesion(s) as noted in physical exam after debriding lesions to pinpoint bleeding.  Salinocaine applied to lesion(s) and covered with an occlusive dressing with Coban wrap.   Written and oral instructions given to patient. -Return in 2 weeks for follow-up and reevaluation.

## 2024-05-18 ENCOUNTER — Encounter: Payer: Self-pay | Admitting: Podiatry

## 2024-05-18 ENCOUNTER — Ambulatory Visit (INDEPENDENT_AMBULATORY_CARE_PROVIDER_SITE_OTHER): Admitting: Podiatry

## 2024-05-18 DIAGNOSIS — B070 Plantar wart: Secondary | ICD-10-CM

## 2024-05-18 NOTE — Progress Notes (Signed)
 Patient presents follow-up verrucous lesion foot left.  Not as much pain as before.   Physical exam:  General appearance: Pleasant, and in no acute distress. AOx3.  Vascular: Pedal pulses: DP 2/4 bilaterally, PT 2/4 bilaterally. Mild edema lower legs bilaterally. Capillary fill time immediate.  Neurological: Grossly intact bilaterally  Dermatologic:   Verrucous lesion plantar fifth MTP left foot improved.  Skin lines still going around the lesion with some pinpoint hemorrhages.  Skin normal temperature bilaterally.  Skin normal color, tone, and texture bilaterally.     Diagnosis: Plantar verruca left   Plan: -Verrucous lesion improved will try second Solik with Salinocaine application today.  -Applied Salinocaine compound to lesion(s) as noted in physical exam after debriding lesions to pinpoint bleeding.  Salinocaine applied to lesion(s) and covered with an occlusive dressing with Coban wrap.  Written and oral instructions given to patient. -Return in 2 weeks for follow-up and reevaluation.   Return 2 weeks follow-up wart left

## 2024-06-14 ENCOUNTER — Ambulatory Visit: Admitting: Podiatry

## 2024-06-14 ENCOUNTER — Ambulatory Visit (INDEPENDENT_AMBULATORY_CARE_PROVIDER_SITE_OTHER): Admitting: Podiatry

## 2024-06-14 DIAGNOSIS — M7752 Other enthesopathy of left foot: Secondary | ICD-10-CM

## 2024-06-14 NOTE — Progress Notes (Signed)
 Patient presents follow-up plantar wart left foot.  No really significant pain at the area now.  Says it is a full sensation but it is somewhat swollen.  Doing much better   Physical exam:  General appearance: Pleasant, and in no acute distress. AOx3.  Vascular: Pedal pulses: DP 2/4 bilaterally, PT 2/4 bilaterally. Mild edema lower legs bilaterally. Capillary fill time needed bilaterally.  Neurological: Intact bilaterally  Dermatologic:   Verrucous lesion appears to be resolved with normal skin lines.  Some thickening of the skin in the area.  Musculoskeletal: Slight soreness palpation at the plantar lateral aspect of fifth MTP left tailor bunion deformity left    Diagnosis: 1.  Capsulitis fifth MTP left  Plan: -established office visit for evaluation management level 3. -Discussed with him that the verrucous lesion appears are resolved.  Discussed possibly recurrence.  I think some of the discomfort he is going now is is coming from the tailor's bunion deformity.  Discussed wearing good shoes with good padding.   Return as needed

## 2024-06-20 ENCOUNTER — Encounter: Payer: Self-pay | Admitting: Cardiology

## 2024-06-20 ENCOUNTER — Encounter: Payer: Self-pay | Admitting: Internal Medicine

## 2024-06-20 ENCOUNTER — Other Ambulatory Visit: Payer: Self-pay | Admitting: Cardiology

## 2024-06-21 ENCOUNTER — Other Ambulatory Visit: Payer: Self-pay | Admitting: Family

## 2024-06-21 MED ORDER — ZOLPIDEM TARTRATE ER 12.5 MG PO TBCR: 12.5000 mg | EXTENDED_RELEASE_TABLET | Freq: Every evening | ORAL | 0 refills | Status: AC | PRN

## 2024-06-21 NOTE — Telephone Encounter (Signed)
 Requesting: Ambien  CR 12.5mg   Contract: 11/26/22 UDS: Ambien  only Last Visit: 05/03/24 Next Visit: 09/07/24 Last Refill: 03/23/24 #90 and 0RF   Please Advise

## 2024-06-28 ENCOUNTER — Encounter: Payer: Self-pay | Admitting: Cardiology

## 2024-06-28 ENCOUNTER — Ambulatory Visit: Attending: Cardiology | Admitting: Cardiology

## 2024-06-28 VITALS — BP 140/90 | HR 80 | Ht 74.0 in | Wt 189.0 lb

## 2024-06-28 DIAGNOSIS — E782 Mixed hyperlipidemia: Secondary | ICD-10-CM | POA: Insufficient documentation

## 2024-06-28 DIAGNOSIS — I2510 Atherosclerotic heart disease of native coronary artery without angina pectoris: Secondary | ICD-10-CM | POA: Diagnosis not present

## 2024-06-28 DIAGNOSIS — I447 Left bundle-branch block, unspecified: Secondary | ICD-10-CM | POA: Insufficient documentation

## 2024-06-28 DIAGNOSIS — G4733 Obstructive sleep apnea (adult) (pediatric): Secondary | ICD-10-CM | POA: Insufficient documentation

## 2024-06-28 DIAGNOSIS — I10 Essential (primary) hypertension: Secondary | ICD-10-CM | POA: Diagnosis not present

## 2024-06-28 NOTE — Patient Instructions (Signed)

## 2024-06-28 NOTE — Progress Notes (Unsigned)
 Cardiology Office Note:    Date:  06/28/2024   ID:  SALAHUDDIN ARISMENDEZ, DOB [birth year], MRN [id number]  PCP:  Amon Aloysius BRAVO, MD  Cardiologist:  [name] Fitch, MD    Referring MD: Amon Aloysius BRAVO, MD   Chief Complaint  Patient presents with   Follow-up    History of Present Illness:    Peter Spencer is a 63 y.o. male past medical history significant for coronary artery disease he did have coronary scans which showed moderate stenosis of LAD, stress test done after that was negative, he does have chronic left bundle branch block ejection fraction 40 to 45%, dyslipidemia.  Comes today to months for follow-up overall doing fine.  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  He said he is very close to retiring.  Denies have any chest pain tightness squeezing pressure mid chest, still described heartburn did typically happen after he eats certain food  Past Medical History:  Diagnosis Date   Acquired hypothyroidism 11/18/2014   Annual physical exam 03/01/2020   Anxiety    Benign essential hypertension 11/18/2014   CAD (coronary artery disease)    CAD in native artery 10/23/2018   Chronic insomnia 11/18/2014   Dupuytren's contracture of left hand 11/25/2019   GERD (gastroesophageal reflux disease)    GERD without esophagitis 10/23/2018   Hyperlipidemia    Hypertension    no meds now   Hypertensive urgency 09/28/2013   Hypothyroidism    LBBB (left bundle branch block) 09/29/2013   Left bundle branch block    OSA (obstructive sleep apnea)    PCP NOTES >>>>>>>>>>>>>> 03/02/2020   Retinal detachment 06/2018   Right   Retinal detachment, right 11/25/2019   SCC (squamous cell carcinoma)    Seasonal allergies    Thrombocytopenia (HCC)--history of, reports extensive w/u (early 2000, ITP?) 11/29/2020   Thyroid  disease     Past Surgical History:  Procedure Laterality Date   BASAL CELL CARCINOMA EXCISION N/A 10/30/2020   Procedure:  Excision of scalp squamous cell carcinoma with frozen section, application of integra;  Surgeon: Elisabeth Craig RAMAN, MD;  Location: [facility];  Service: Plastics;  Laterality: N/A;  1 hour, please   COLONOSCOPY  01/15/2013   .   COLONOSCOPY  11/27/2015   [facility]. Gunnar Daniels, MD. Diverticulosis of the whole colon, ascending colon and descending colon. Grade 2 internal hemorrhoids. Repeat in 5 years.   DUPUYTREN CONTRACTURE RELEASE Left 05/2016   DUPUYTREN CONTRACTURE RELEASE Left 03/17/2024   FASCIECTOMY Left 07/11/2016   Procedure: FASCIECTOMY left index possible joint release;  Surgeon: Arley Curia, MD;  Location: [facility];  Service: Orthopedics;  Laterality: Left;  FASCIECTOMY left index possible joint release   KNEE SURGERY     Left eye retna repaired      MOHS SURGERY  2017   NASAL FRACTURE SURGERY     RETINAL DETACHMENT SURGERY Right 06/2018   TOE SURGERY     TONSILLECTOMY      Current Medications: Current Meds  Medication Sig   aspirin  EC 81 MG tablet Take 81 mg by mouth daily.   azelastine  (ASTELIN ) 0.1 % nasal spray Place 2 sprays into both nostrils at bedtime as needed for rhinitis or allergies. Use in each nostril as directed   Cholecalciferol (VITAMIN D ) 125 MCG (5000 UT) CAPS Take 5,000 Units by mouth daily.   doxycycline  (VIBRAMYCIN ) 100 MG capsule Take 100 mg  by mouth daily.   esomeprazole  (NEXIUM ) 40 MG capsule Take 1 capsule (40 mg total) by mouth 2 (two) times daily before a meal.   ezetimibe  (ZETIA ) 10 MG tablet Take 1 tablet (10 mg total) by mouth daily.   fluorouracil (EFUDEX) 5 % cream Apply 1 Application topically daily.   fluticasone (FLONASE) 50 MCG/ACT nasal spray Place 1 spray into both nostrils as needed for allergies or rhinitis.   ketoconazole (NIZORAL) 2 % cream Apply 1 Application topically daily.   levothyroxine  (SYNTHROID ) 112 MCG tablet Take 1 tablet (112 mcg total) by mouth daily before breakfast.    magnesium  oxide (MAG-OX) 400 MG tablet Take 400 mg by mouth daily.   metroNIDAZOLE  (METROGEL ) 0.75 % gel Apply 1 application. topically as needed (rash).   Omega-3 1000 MG CAPS Take 3,800 mg by mouth daily.   pravastatin  (PRAVACHOL ) 40 MG tablet TAKE 1 TABLET BY MOUTH EVERYDAY AT BEDTIME   topiramate (TOPAMAX) 25 MG tablet Take 50 mg by mouth at bedtime.   TYRVAYA 0.03 MG/ACT SOLN Place 1 spray into the nose 2 (two) times daily.   zolpidem  (AMBIEN  CR) 12.5 MG CR tablet Take 1 tablet (12.5 mg total) by mouth at bedtime as needed for sleep.     Allergies:   Dust mite extract, Mold extract [trichophyton], Tetracyclines & related, and Sulfa antibiotics   Social History   Socioeconomic History   Marital status: Married    Spouse name: Not on file   Number of children: 2   Years of education: Not on file   Highest education level: Not on file  Occupational History   Occupation: self employed    Occupation: Facilities Manager, [employer]  Tobacco Use   Smoking status: Never   Smokeless tobacco: Never  Vaping Use   Vaping status: Never Used  Substance and Sexual Activity   Alcohol use: Yes   Drug use: No   Sexual activity: Not on file  Other Topics Concern   Not on file  Social History Narrative   Household: pt and wife   Social Drivers of Corporate Investment Banker Strain: Low Risk  (03/03/2019)   Received from [facility] visits prior to 09/28/2022.   Overall Financial Resource Strain (CARDIA)    Difficulty of Paying Living Expenses: Not hard at all  Food Insecurity: No Food Insecurity (03/03/2019)   Received from [facility] visits prior to 09/28/2022.   Hunger Vital Sign    Within the past 12 months, you worried that your food would run out before you got the money to buy more.: Never true    Within the past 12 months, the food you bought just didn't last and you didn't have money to get more.: Never true  Transportation Needs: No Transportation  Needs (03/03/2019)   Received from [facility] visits prior to 09/28/2022.   PRAPARE - Administrator, Civil Service (Medical): No    Lack of Transportation (Non-Medical): No  Physical Activity: Not on file  Stress: No Stress Concern Present (03/03/2019)   Received from [facility] visits prior to 09/28/2022.   [employer] of Occupational Health - Occupational Stress Questionnaire    Feeling of Stress : Not at all  Social Connections: Unknown (12/11/2021)   Received from [facility]   Social Network    Social Network: Not on file     Family History: The patient's family history includes Atrial fibrillation  in his father; CAD in an other family member; Dementia in his father; Pancreatic cancer in his mother; Parkinsonism in his father. There is no history of Stroke, Diabetes Mellitus II, Colon cancer, Prostate cancer, Liver disease, or Esophageal cancer. ROS:   Please see the history of present illness.    All 14 point review of systems negative except as described per history of present illness  EKGs/Labs/Other Studies Reviewed:    EKG Interpretation Date/Time:  Monday June 28 2024 16:10:57 EST Ventricular Rate:  80 PR Interval:  186 QRS Duration:  160 QT Interval:  434 QTC Calculation: 500 R Axis:   43  Text Interpretation: Normal sinus rhythm Left bundle branch block When compared with ECG of 14-Mar-2023 09:04, Nonspecific T wave abnormality no longer evident in Inferior leads Confirmed by Bernie Charleston [id number]) on 06/28/2024 4:14:52 PM    Recent Labs: 12/03/2023: ALT 21 05/03/2024: BUN 19; Creatinine, Ser 1.02; Hemoglobin 14.7; Platelets 120.0; Potassium 4.5; Sodium 138; TSH 2.61  Recent Lipid Panel    Component Value Date/Time   CHOL 142 12/03/2023 1100   TRIG 62 12/03/2023 1100   HDL 52 12/03/2023 1100   CHOLHDL 2.7 12/03/2023 1100   CHOLHDL 3 09/26/2023 0931   VLDL 8.2 09/26/2023 0931   LDLCALC 77  12/03/2023 1100    Physical Exam:    VS:  BP (!) 140/90   Pulse 80   Ht 6' 2 (1.88 m)   Wt 189 lb (85.7 kg)   SpO2 99%   BMI 24.27 kg/m     Wt Readings from Last 3 Encounters:  06/28/24 189 lb (85.7 kg)  05/03/24 186 lb (84.4 kg)  02/12/24 187 lb 2 oz (84.9 kg)     GEN:  Well nourished, well developed in no acute distress HEENT: Normal NECK: No JVD; No carotid bruits LYMPHATICS: No lymphadenopathy CARDIAC: RRR, no murmurs, no rubs, no gallops RESPIRATORY:  Clear to auscultation without rales, wheezing or rhonchi  ABDOMEN: Soft, non-tender, non-distended MUSCULOSKELETAL:  No edema; No deformity  SKIN: Warm and dry LOWER EXTREMITIES: no swelling NEUROLOGIC:  Alert and oriented x 3 PSYCHIATRIC:  Normal affect   ASSESSMENT:    1. Benign essential hypertension   2. Coronary artery disease involving native coronary artery of native heart without angina pectoris   3. LBBB (left bundle branch block)   4. OSA (obstructive sleep apnea)   5. Mixed hyperlipidemia    PLAN:    In order of problems listed above:  Essential hypertension blood pressure slightly on the higher side advised him to check blood pressure within next week every day and then send results to me. Left bundle branch block, chronic, will continue present management his echocardiogram will be check during next visit. Obstructive sleep apnea followed by internal medicine team. Mixed dyslipidemia stable   Medication Adjustments/Labs and Tests Ordered: Current medicines are reviewed at length with the patient today.  Concerns regarding medicines are outlined above.  Orders Placed This Encounter  Procedures   EKG 12-Lead   Medication changes: No orders of the defined types were placed in this encounter.   Signed, [place] DOROTHA Bernie, MD, [facility] 06/28/2024 4:54 PM    [facility]

## 2024-07-11 ENCOUNTER — Other Ambulatory Visit: Payer: Self-pay | Admitting: Internal Medicine

## 2024-07-19 ENCOUNTER — Other Ambulatory Visit (HOSPITAL_BASED_OUTPATIENT_CLINIC_OR_DEPARTMENT_OTHER): Payer: Self-pay | Admitting: Otolaryngology

## 2024-07-19 DIAGNOSIS — C4442 Squamous cell carcinoma of skin of scalp and neck: Secondary | ICD-10-CM

## 2024-07-19 DIAGNOSIS — M952 Other acquired deformity of head: Secondary | ICD-10-CM

## 2024-07-25 ENCOUNTER — Other Ambulatory Visit: Payer: Self-pay | Admitting: Internal Medicine

## 2024-07-25 DIAGNOSIS — K2100 Gastro-esophageal reflux disease with esophagitis, without bleeding: Secondary | ICD-10-CM

## 2024-08-02 DIAGNOSIS — J189 Pneumonia, unspecified organism: Secondary | ICD-10-CM

## 2024-08-02 MED ORDER — ALBUTEROL SULFATE HFA 108 (90 BASE) MCG/ACT IN AERS: 2.0000 | INHALATION_SPRAY | Freq: Four times a day (QID) | RESPIRATORY_TRACT | 0 refills | Status: AC | PRN

## 2024-08-02 MED ORDER — AMOXICILLIN-POT CLAVULANATE 875-125 MG PO TABS: 1.0000 | ORAL_TABLET | Freq: Two times a day (BID) | ORAL | 0 refills | Status: DC

## 2024-08-02 MED ORDER — PREDNISONE 20 MG PO TABS: 40.0000 mg | ORAL_TABLET | Freq: Every day | ORAL | 0 refills | Status: AC

## 2024-08-02 NOTE — Progress Notes (Signed)
 " Virtual Visit Consent   Peter Spencer, you are scheduled for a virtual visit with a [facility] provider today. Just as with appointments in the office, your consent must be obtained to participate. Your consent will be active for this visit and any virtual visit you may have with one of our providers in the next 365 days. If you have a MyChart account, a copy of this consent can be sent to you electronically.  As this is a virtual visit, video technology does not allow for your provider to perform a traditional examination. This may limit your provider's ability to fully assess your condition. If your provider identifies any concerns that need to be evaluated in person or the need to arrange testing (such as labs, EKG, etc.), we will make arrangements to do so. Although advances in technology are sophisticated, we cannot ensure that it will always work on either your end or our end. If the connection with a video visit is poor, the visit may have to be switched to a telephone visit. With either a video or telephone visit, we are not always able to ensure that we have a secure connection.  By engaging in this virtual visit, you consent to the provision of healthcare and authorize for your insurance to be billed (if applicable) for the services provided during this visit. Depending on your insurance coverage, you may receive a charge related to this service.  I need to obtain your verbal consent now. Are you willing to proceed with your visit today? Peter Spencer Howatt has provided verbal consent on 08/02/2024 for a virtual visit (video or telephone). Peter HERO Belt, NP  Date: 08/02/2024 6:51 PM   Virtual Visit via Video Note   I, Peter Spencer, connected with  Peter Spencer  ([id number], [birth year]) on 08/02/2024 at  6:45 PM EST by a video-enabled telemedicine application and verified that I am speaking with the correct person using two identifiers.  Location: Patient: Virtual Visit Location  Patient: Home Provider: Virtual Visit Location Provider: Home Office   I discussed the limitations of evaluation and management by telemedicine and the availability of in person appointments. The patient expressed understanding and agreed to proceed.    History of Present Illness: Peter Spencer is a 64 y.o. who identifies as a male who was assigned male at birth, and is being seen today for sick for 10-12 days. Initially was sinus pressure, it settled into chest about 5 days ago. He is coughing up stuff. Blowing similar stuff from his nose. Feels very tired. No wheezing or SOB at this moment but has used an inhaler in the past and feels like he would benefit from one now.   No fever.   Has been taking doxycycline  for last 2 weeks for derm reasons. WAs taking once daily but started taking twice daily on 07/29/24; has 8 more pills to go. STill feels very sick but does feel improved today compared to when he started doxy BID.     HPI: HPI  Problems:  Patient Active Problem List   Diagnosis Date Noted   SCC (squamous cell carcinoma) 11/04/2022   Dysfunction of both eustachian tubes 08/31/2021   Episodic tension-type headache, not intractable 08/30/2021   Thrombocytopenia (HCC)--history of, reports extensive w/u (early 2000, ITP?) 11/29/2020   Seasonal allergies    Hypothyroidism    Hypertension    GERD (gastroesophageal reflux disease)    CAD (coronary artery disease)    Anxiety  PCP NOTES >>>>>>>>>>>>>> 03/02/2020   Annual physical exam 03/01/2020   Retinal detachment, right 11/25/2019   Dupuytren's contracture of left hand 11/25/2019   Hyperlipidemia 10/23/2018   OSA (obstructive sleep apnea) 01/12/2018   Acquired hypothyroidism 11/18/2014   Chronic insomnia 11/18/2014   LBBB (left bundle branch block) 09/29/2013    Allergies: Allergies[1] Medications: Current Medications[2]  Observations/Objective: Patient is well-developed, well-nourished in no acute distress.   Resting comfortably  at home.  Head is normocephalic, atraumatic.  No labored breathing.  Speech is clear and coherent with logical content.  Patient is alert and oriented at baseline.    Assessment and Plan: 1. Community acquired pneumonia, unspecified laterality (Primary)  Is already on doxy and still feels poorly. Does feel a little improved after 4.5 days of doxy BID. Will add augmentin . Concern is for possible CAP. Will rx inhaler as requested. He will f/u with PCP if not substantially better in 48 hours.   Follow Up Instructions: I discussed the assessment and treatment plan with the patient. The patient was provided an opportunity to ask questions and all were answered. The patient agreed with the plan and demonstrated an understanding of the instructions.  A copy of instructions were sent to the patient via MyChart unless otherwise noted below.   The patient was advised to call back or seek an in-person evaluation if the symptoms worsen or if the condition fails to improve as anticipated.    Peter CHRISTELLA Belt, NP    [1]  Allergies Allergen Reactions   Dust Mite Extract    Mold Extract [Trichophyton]    Tetracyclines & Related Other (See Comments)    Caused liver enzymes to go up   Sulfa Antibiotics Nausea And Vomiting and Nausea Only  [2]  Current Outpatient Medications:    albuterol  (VENTOLIN  HFA) 108 (90 Base) MCG/ACT inhaler, Inhale 2 puffs into the lungs every 6 (six) hours as needed for wheezing or shortness of breath., Disp: 8 g, Rfl: 0   amoxicillin -clavulanate (AUGMENTIN ) 875-125 MG tablet, Take 1 tablet by mouth 2 (two) times daily., Disp: 14 tablet, Rfl: 0   predniSONE  (DELTASONE ) 20 MG tablet, Take 2 tablets (40 mg total) by mouth daily with breakfast for 5 days., Disp: 10 tablet, Rfl: 0   aspirin  EC 81 MG tablet, Take 81 mg by mouth daily., Disp: , Rfl:    azelastine  (ASTELIN ) 0.1 % nasal spray, Place 2 sprays into both nostrils at bedtime as needed for rhinitis  or allergies. Use in each nostril as directed, Disp: 30 mL, Rfl: 12   Cholecalciferol (VITAMIN D ) 125 MCG (5000 UT) CAPS, Take 5,000 Units by mouth daily., Disp: , Rfl:    doxycycline  (VIBRAMYCIN ) 100 MG capsule, Take 100 mg by mouth daily., Disp: , Rfl:    esomeprazole  (NEXIUM ) 40 MG capsule, Take 1 capsule (40 mg total) by mouth 2 (two) times daily before a meal., Disp: 180 capsule, Rfl: 1   ezetimibe  (ZETIA ) 10 MG tablet, Take 1 tablet (10 mg total) by mouth daily., Disp: 90 tablet, Rfl: 3   fluorouracil (EFUDEX) 5 % cream, Apply 1 Application topically daily., Disp: , Rfl:    fluticasone (FLONASE) 50 MCG/ACT nasal spray, Place 1 spray into both nostrils as needed for allergies or rhinitis., Disp: , Rfl:    ketoconazole (NIZORAL) 2 % cream, Apply 1 Application topically daily., Disp: , Rfl:    levothyroxine  (SYNTHROID ) 112 MCG tablet, TAKE 1 TABLET BY MOUTH DAILY BEFORE BREAKFAST., Disp: 90 tablet, Rfl: 0  magnesium  oxide (MAG-OX) 400 MG tablet, Take 400 mg by mouth daily., Disp: , Rfl:    metroNIDAZOLE  (METROGEL ) 0.75 % gel, Apply 1 application. topically as needed (rash)., Disp: , Rfl:    Omega-3 1000 MG CAPS, Take 3,800 mg by mouth daily., Disp: , Rfl:    pravastatin  (PRAVACHOL ) 40 MG tablet, TAKE 1 TABLET BY MOUTH EVERYDAY AT BEDTIME, Disp: 90 tablet, Rfl: 1   topiramate (TOPAMAX) 25 MG tablet, Take 50 mg by mouth at bedtime., Disp: , Rfl:    TYRVAYA 0.03 MG/ACT SOLN, Place 1 spray into the nose 2 (two) times daily., Disp: , Rfl:    zolpidem  (AMBIEN  CR) 12.5 MG CR tablet, Take 1 tablet (12.5 mg total) by mouth at bedtime as needed for sleep., Disp: 90 tablet, Rfl: 0  "

## 2024-08-02 NOTE — Patient Instructions (Signed)
 " Peter Spencer, thank you for joining [name], NP for today's virtual visit.  While this provider is not your primary care provider (PCP), if your PCP is located in our provider database this encounter information will be shared with them immediately following your visit.   A [place] MyChart account gives you access to today's visit and all your visits, tests, and labs performed at [facility]  click here if you don't have a [place] MyChart account or go to [email or web address]  Consent: (Patient) Peter Spencer provided verbal consent for this virtual visit at the beginning of the encounter.  Current Medications:  Current Outpatient Medications:    albuterol  (VENTOLIN  HFA) 108 (90 Base) MCG/ACT inhaler, Inhale 2 puffs into the lungs every 6 (six) hours as needed for wheezing or shortness of breath., Disp: 8 g, Rfl: 0   amoxicillin -clavulanate (AUGMENTIN ) 875-125 MG tablet, Take 1 tablet by mouth 2 (two) times daily., Disp: 14 tablet, Rfl: 0   predniSONE  (DELTASONE ) 20 MG tablet, Take 2 tablets (40 mg total) by mouth daily with breakfast for 5 days., Disp: 10 tablet, Rfl: 0   aspirin  EC 81 MG tablet, Take 81 mg by mouth daily., Disp: , Rfl:    azelastine  (ASTELIN ) 0.1 % nasal spray, Place 2 sprays into both nostrils at bedtime as needed for rhinitis or allergies. Use in each nostril as directed, Disp: 30 mL, Rfl: 12   Cholecalciferol (VITAMIN D ) 125 MCG (5000 UT) CAPS, Take 5,000 Units by mouth daily., Disp: , Rfl:    doxycycline  (VIBRAMYCIN ) 100 MG capsule, Take 100 mg by mouth daily., Disp: , Rfl:    esomeprazole  (NEXIUM ) 40 MG capsule, Take 1 capsule (40 mg total) by mouth 2 (two) times daily before a meal., Disp: 180 capsule, Rfl: 1   ezetimibe  (ZETIA ) 10 MG tablet, Take 1 tablet (10 mg total) by mouth daily., Disp: 90 tablet, Rfl: 3   fluorouracil (EFUDEX) 5 % cream, Apply 1 Application topically daily., Disp: , Rfl:    fluticasone (FLONASE) 50  MCG/ACT nasal spray, Place 1 spray into both nostrils as needed for allergies or rhinitis., Disp: , Rfl:    ketoconazole (NIZORAL) 2 % cream, Apply 1 Application topically daily., Disp: , Rfl:    levothyroxine  (SYNTHROID ) 112 MCG tablet, TAKE 1 TABLET BY MOUTH DAILY BEFORE BREAKFAST., Disp: 90 tablet, Rfl: 0   magnesium  oxide (MAG-OX) 400 MG tablet, Take 400 mg by mouth daily., Disp: , Rfl:    metroNIDAZOLE  (METROGEL ) 0.75 % gel, Apply 1 application. topically as needed (rash)., Disp: , Rfl:    Omega-3 1000 MG CAPS, Take 3,800 mg by mouth daily., Disp: , Rfl:    pravastatin  (PRAVACHOL ) 40 MG tablet, TAKE 1 TABLET BY MOUTH EVERYDAY AT BEDTIME, Disp: 90 tablet, Rfl: 1   topiramate (TOPAMAX) 25 MG tablet, Take 50 mg by mouth at bedtime., Disp: , Rfl:    TYRVAYA 0.03 MG/ACT SOLN, Place 1 spray into the nose 2 (two) times daily., Disp: , Rfl:    zolpidem  (AMBIEN  CR) 12.5 MG CR tablet, Take 1 tablet (12.5 mg total) by mouth at bedtime as needed for sleep., Disp: 90 tablet, Rfl: 0   Medications ordered in this encounter:  Meds ordered this encounter  Medications   amoxicillin -clavulanate (AUGMENTIN ) 875-125 MG tablet    Sig: Take 1 tablet by mouth 2 (two) times daily.    Dispense:  14 tablet    Refill:  0   albuterol  (VENTOLIN  HFA) 108 (90 Base)  MCG/ACT inhaler    Sig: Inhale 2 puffs into the lungs every 6 (six) hours as needed for wheezing or shortness of breath.    Dispense:  8 g    Refill:  0   predniSONE  (DELTASONE ) 20 MG tablet    Sig: Take 2 tablets (40 mg total) by mouth daily with breakfast for 5 days.    Dispense:  10 tablet    Refill:  0     *If you need refills on other medications prior to your next appointment, please contact your pharmacy*  Follow-Up: Call back or seek an in-person evaluation if the symptoms worsen or if the condition fails to improve as anticipated.  [facility] [phone]  Other Instructions  Use the albuterol  inhaler 2 puffs every 4-6  hours if needed for shortness of breath or wheezing.   I do not expect you to feel 100% better right away but you should be feeling better within 48 hours. If not, please see Dr. Amon. If you are very short of breath or feel much worse in the meantime, please be seen in person, either by Dr. Amon or at an urgent care.    If you have been instructed to have an in-person evaluation today at a local Urgent Care facility, please use the link below. It will take you to a list of all of our available [facility], including address, phone number and hours of operation. Please do not delay care.  [facility]  If you or a family member do not have a primary care provider, use the link below to schedule a visit and establish care. When you choose a [place] [place] primary care physician or advanced practice provider, you gain a long-term partner in health. Find a Primary Care Provider  Learn more about [facility] in-office and virtual care options: [facility] Get Care Now  "

## 2024-08-08 ENCOUNTER — Other Ambulatory Visit: Payer: Self-pay | Admitting: Emergency Medicine

## 2024-08-11 ENCOUNTER — Other Ambulatory Visit: Payer: Self-pay | Admitting: Otolaryngology

## 2024-08-11 NOTE — Progress Notes (Signed)
 Surgical Instructions   Your procedure is scheduled on August 18, 2024. Report to [facility] Main Entrance A at 10:55 A.M., then check in with the Admitting office. Any questions or running late day of surgery: call [phone]  Questions prior to your surgery date: call [phone], Monday-Friday, 8am-4pm. If you experience any cold or flu symptoms such as cough, fever, chills, shortness of breath, etc. between now and your scheduled surgery, please notify us  at the above number.     Remember:  Do not eat after midnight the night before your surgery   You may drink clear liquids until 9:55 the morning of your surgery.   Clear liquids allowed are: Water, Non-Citrus Juices (without pulp), Carbonated Beverages, Clear Tea (no milk, honey, etc.), Black Coffee Only (NO MILK, CREAM OR POWDERED CREAMER of any kind), and Gatorade.    Take these medicines the morning of surgery with A SIP OF WATER  esomeprazole  (NEXIUM )  ezetimibe  (ZETIA )  TYRVAYA  levothyroxine  (SYNTHROID )    May take these medicines IF NEEDED: albuterol  (VENTOLIN  HFA) inhaler  azelastine  (ASTELIN )  fluticasone (FLONASE)   One week prior to surgery, STOP taking any Aspirin  (unless otherwise instructed by your surgeon) Aleve, Naproxen, Ibuprofen, Motrin, Advil, Goody's, BC's, all herbal medications, fish oil, and non-prescription vitamins. THIS INCLUDES YOUR Omega-3.                      Do NOT Smoke (Tobacco/Vaping) for 24 hours prior to your procedure.  If you use a CPAP at night, you may bring your mask/headgear for your overnight stay.   You will be asked to remove any contacts, glasses, piercing's, hearing aid's, dentures/partials prior to surgery. Please bring cases for these items if needed.    Your surgeon will determine if you are to be admitted or discharged the same day.  Patients discharged the day of surgery will not be allowed to drive home, and someone needs to stay with them for 24  hours.  SURGICAL WAITING ROOM VISITATION Patients may have no more than 2 support people in the waiting area - these visitors may rotate.   Pre-op nurse will coordinate an appropriate time for 2 ADULT support persons, who may not rotate, to accompany patient in pre-op.  Children under the age of 65 must have an adult with them who is not the patient and must remain in the main waiting area with an adult.  If the patient needs to stay at the hospital during part of their recovery, the visitor guidelines for inpatient rooms apply.  Please refer to the [facility] website for the visitor guidelines for any additional information.   If you received a COVID test during your pre-op visit  it is requested that you wear a mask when out in public, stay away from anyone that may not be feeling well and notify your surgeon if you develop symptoms. If you have been in contact with anyone that has tested positive in the last 10 days please notify you surgeon.      Pre-operative CHG Bathing Instructions   You can play a key role in reducing the risk of infection after surgery. Your skin needs to be as free of germs as possible. You can reduce the number of germs on your skin by washing with CHG (chlorhexidine  gluconate) soap before surgery. CHG is an antiseptic soap that kills germs and continues to kill germs even after washing.   DO NOT use if you have an allergy  to chlorhexidine /CHG or antibacterial soaps. If your skin becomes reddened or irritated, stop using the CHG and notify one of our RNs at [phone].              TAKE A SHOWER THE NIGHT BEFORE SURGERY   Please keep in mind the following:  DO NOT shave, including legs and underarms, 48 hours prior to surgery.   You may shave your face before/day of surgery.  Place clean sheets on your bed the night before surgery Use a clean washcloth (not used since being washed) for shower. DO NOT sleep with pet's night before surgery.  CHG Shower  Instructions:  Wash your face and private area with normal soap. If you choose to wash your hair, wash first with your normal shampoo.  After you use shampoo/soap, rinse your hair and body thoroughly to remove shampoo/soap residue.  Turn the water OFF and apply half the bottle of CHG soap to a CLEAN washcloth.  Apply CHG soap ONLY FROM YOUR NECK DOWN TO YOUR TOES (washing for 3-5 minutes)  DO NOT use CHG soap on face, private areas, open wounds, or sores.  Pay special attention to the area where your surgery is being performed.  If you are having back surgery, having someone wash your back for you may be helpful. Wait 2 minutes after CHG soap is applied, then you may rinse off the CHG soap.  Pat dry with a clean towel  Put on clean pajamas    Additional instructions for the day of surgery: If you choose, you may shower the morning of surgery with an antibacterial soap.  DO NOT APPLY any lotions, deodorants, cologne, or perfumes.   Do not wear jewelry or makeup Do not wear nail polish, gel polish, artificial nails, or any other type of covering on natural nails (fingers and toes) Do not bring valuables to the hospital. [facility] is not responsible for valuables/personal belongings. Put on clean/comfortable clothes.  Please brush your teeth.  Ask your nurse before applying any prescription medications to the skin.

## 2024-08-12 ENCOUNTER — Encounter (HOSPITAL_COMMUNITY): Payer: Self-pay

## 2024-08-12 ENCOUNTER — Other Ambulatory Visit: Payer: Self-pay

## 2024-08-12 ENCOUNTER — Encounter (HOSPITAL_COMMUNITY)
Admission: RE | Admit: 2024-08-12 | Discharge: 2024-08-12 | Disposition: A | Discharge status: Home / Self Care | Source: Ambulatory Visit | Attending: Otolaryngology | Admitting: Otolaryngology

## 2024-08-12 VITALS — BP 158/86 | HR 84 | Temp 97.7°F | Resp 18 | Ht 74.0 in | Wt 190.3 lb

## 2024-08-12 DIAGNOSIS — Z01818 Encounter for other preprocedural examination: Secondary | ICD-10-CM | POA: Insufficient documentation

## 2024-08-12 DIAGNOSIS — I2510 Atherosclerotic heart disease of native coronary artery without angina pectoris: Secondary | ICD-10-CM | POA: Insufficient documentation

## 2024-08-12 HISTORY — DX: Inflammatory liver disease, unspecified: K75.9

## 2024-08-12 HISTORY — DX: Nausea with vomiting, unspecified: R11.2

## 2024-08-12 HISTORY — DX: Other complications of anesthesia, initial encounter: T88.59XA

## 2024-08-12 HISTORY — DX: Disease of blood and blood-forming organs, unspecified: D75.9

## 2024-08-12 LAB — BASIC METABOLIC PANEL WITH GFR
Anion gap: 8 (ref 5–15)
BUN: 26 mg/dL — ABNORMAL HIGH (ref 8–23)
CO2: 26 mmol/L (ref 22–32)
Calcium: 8.6 mg/dL — ABNORMAL LOW (ref 8.9–10.3)
Chloride: 101 mmol/L (ref 98–111)
Creatinine, Ser: 1.12 mg/dL (ref 0.61–1.24)
GFR, Estimated: >60 mL/min
Glucose, Bld: 107 mg/dL — ABNORMAL HIGH (ref 70–99)
Potassium: 4.2 mmol/L (ref 3.5–5.1)
Sodium: 135 mmol/L (ref 135–145)

## 2024-08-12 LAB — CBC
HCT: 41.6 % (ref 39.0–52.0)
Hemoglobin: 14.6 g/dL (ref 13.0–17.0)
MCH: 31.5 pg (ref 26.0–34.0)
MCHC: 35.1 g/dL (ref 30.0–36.0)
MCV: 89.7 fL (ref 80.0–100.0)
Platelets: 120 K/uL — ABNORMAL LOW (ref 150–400)
RBC: 4.64 MIL/uL (ref 4.22–5.81)
RDW: 12.7 % (ref 11.5–15.5)
WBC: 5.2 K/uL (ref 4.0–10.5)
nRBC: 0 % (ref 0.0–0.2)

## 2024-08-12 NOTE — Progress Notes (Signed)
 Surgical Instructions   Your procedure is scheduled on August 18, 2024. Report to [facility] Main Entrance A at 10:55 A.M., then check in with the Admitting office. Any questions or running late day of surgery: call [phone]  Questions prior to your surgery date: call [phone], Monday-Friday, 8am-4pm. If you experience any cold or flu symptoms such as cough, fever, chills, shortness of breath, etc. between now and your scheduled surgery, please notify us  at the above number.     Remember:  Do not eat after midnight the night before your surgery   You may drink clear liquids until 9:55 the morning of your surgery.   Clear liquids allowed are: Water, Non-Citrus Juices (without pulp), Carbonated Beverages, Clear Tea (no milk, honey, etc.), Black Coffee Only (NO MILK, CREAM OR POWDERED CREAMER of any kind), and Gatorade.    Take these medicines the morning of surgery with A SIP OF WATER  omeprazole  TYRVAYA  levothyroxine  (SYNTHROID )    May take these medicines IF NEEDED: azelastine  (ASTELIN ) nasal spray fluticasone (FLONASE) nasal spray   Follow your surgeon's instructions on when to stop Aspirin .  If no instructions were given by your surgeon then you will need to call the office to get those instructions.     One week prior to surgery, STOP taking any Aleve, Naproxen, Ibuprofen, Motrin, Advil, Goody's, BC's, all herbal medications, fish oil, and non-prescription vitamins. THIS INCLUDES YOUR Omega-3.                      Do NOT Smoke (Tobacco/Vaping) for 24 hours prior to your procedure.  If you use a CPAP at night, you may bring your mask/headgear for your overnight stay.   You will be asked to remove any contacts, glasses, piercing's, hearing aid's, dentures/partials prior to surgery. Please bring cases for these items if needed.    Your surgeon will determine if you are to be admitted or discharged the same day.  Patients discharged the day of surgery will not be  allowed to drive home, and someone needs to stay with them for 24 hours.  SURGICAL WAITING ROOM VISITATION Patients may have no more than 2 support people in the waiting area - these visitors may rotate.   Pre-op nurse will coordinate an appropriate time for 2 ADULT support persons, who may not rotate, to accompany patient in pre-op.  Children under the age of 31 must have an adult with them who is not the patient and must remain in the main waiting area with an adult.  If the patient needs to stay at the hospital during part of their recovery, the visitor guidelines for inpatient rooms apply.  Please refer to the [facility] website for the visitor guidelines for any additional information.   If you received a COVID test during your pre-op visit  it is requested that you wear a mask when out in public, stay away from anyone that may not be feeling well and notify your surgeon if you develop symptoms. If you have been in contact with anyone that has tested positive in the last 10 days please notify you surgeon.      Pre-operative CHG Bathing Instructions   You can play a key role in reducing the risk of infection after surgery. Your skin needs to be as free of germs as possible. You can reduce the number of germs on your skin by washing with CHG (chlorhexidine  gluconate) soap before surgery. CHG is an antiseptic soap that  kills germs and continues to kill germs even after washing.   DO NOT use if you have an allergy to chlorhexidine /CHG or antibacterial soaps. If your skin becomes reddened or irritated, stop using the CHG and notify one of our RNs at [phone].              TAKE A SHOWER THE NIGHT BEFORE SURGERY   Please keep in mind the following:  DO NOT shave, including legs and underarms, 48 hours prior to surgery.   You may shave your face before/day of surgery.  Place clean sheets on your bed the night before surgery Use a clean washcloth (not used since being washed) for  shower. DO NOT sleep with pet's night before surgery.  CHG Shower Instructions:  Wash your face and private area with normal soap. If you choose to wash your hair, wash first with your normal shampoo.  After you use shampoo/soap, rinse your hair and body thoroughly to remove shampoo/soap residue.  Turn the water OFF and apply half the bottle of CHG soap to a CLEAN washcloth.  Apply CHG soap ONLY FROM YOUR NECK DOWN TO YOUR TOES (washing for 3-5 minutes)  DO NOT use CHG soap on face, private areas, open wounds, or sores.  Pay special attention to the area where your surgery is being performed.  If you are having back surgery, having someone wash your back for you may be helpful. Wait 2 minutes after CHG soap is applied, then you may rinse off the CHG soap.  Pat dry with a clean towel  Put on clean pajamas    Additional instructions for the day of surgery: If you choose, you may shower the morning of surgery with an antibacterial soap.  DO NOT APPLY any lotions, deodorants, cologne, or perfumes.   Do not wear jewelry or makeup Do not wear nail polish, gel polish, artificial nails, or any other type of covering on natural nails (fingers and toes) Do not bring valuables to the hospital. [facility] is not responsible for valuables/personal belongings. Put on clean/comfortable clothes.  Please brush your teeth.  Ask your nurse before applying any prescription medications to the skin.

## 2024-08-12 NOTE — Progress Notes (Signed)
 PCP - Dr. Aloysius Mech Cardiologist - Dr. [name] Fitch - last office visit 06/28/2024  PPM/ICD - Denies Device Orders - n/a Rep Notified - n/a  Chest x-ray - 03/01/2023 EKG - 06/28/2024 Stress Test - 12/13/2014 ECHO - 07/15/2023 Cardiac Cath - Denies  Sleep Study - Mild OSA. Pt wears oral device nightly CPAP - n/a  No DM  Last dose of GLP1 agonist- n/a GLP1 instructions: n/a  Blood Thinner Instructions: n/a Aspirin  Instructions: Pt instructed to contact surgeon's office for ASA instructions  ERAS Protcol - Clear liquids until 0955 morning of surgery PRE-SURGERY Ensure or G2- n/a  COVID TEST- n/a   Anesthesia review: Yes. Hx of CAD, LBBB and mild OSA. BP elevated at pre-op appointment (159/98). Recheck at end of appointment 158/86 manually. Pt does have a blood pressure cuff at home and when he does check it, normal result is 120s/70s.  Patient denies shortness of breath, fever, cough and chest pain at PAT appointment. Pt had a virtual visit with PCP 1/5 for ongoing URI symptoms. He was prescribed Augmentin , steroid and inhaler, for which he completed all treatments. He has had symptom resolution since January 8th/9th. Discussed with Lynwood Hope, PA-C and pt should be fine proceeding with surgery.   All instructions explained to the patient, with a verbal understanding of the material. Patient agrees to go over the instructions while at home for a better understanding. Patient also instructed to self quarantine after being tested for COVID-19. The opportunity to ask questions was provided.

## 2024-08-13 NOTE — Progress Notes (Signed)
 Anesthesia Chart Review:  64 year old male follows with cardiology for history of HTN, OSA treated with oral appliance, moderate nonobstructive CAD by CCTA 2016, HFrEF, chronic left bundle branch block.  Nuclear stress 11/2023 interpreted as intermediate risk.  Dr. Bernie commented on results stating, Stress test showing no evidence of ischemia, there is a fixed defect which can be related to left bundle branch block or old myocardial infarction most likely left bundle in his situation.  Echo 06/2023 showed LVEF 45 to 50%, normal RV systolic function, no significant valvular abnormalities.  Last seen in follow-up by Dr. Bernie 06/28/2024, BP noted to be slightly elevated and recommended to keep a home log, otherwise stable, no changes to management.  Other pertinent history includes PON V, GERD on PPI, hypothyroidism, chronic thrombocytopenia.  Patient was seen via video visit 08/02/2024 with complaint of congestion and productive cough.  He was noted to already have been taking doxycycline  for Derm reasons.  Augmentin  was added.  Patient reports his symptoms abated quickly and he has felt well since January 8 or 9.  Advised if he continues to feel well anticipate he can proceed with surgery as planned.  Preop labs reviewed, mild thrombocytopenia platelets 120k, otherwise unremarkable.  EKG 06/28/2024: NSR.  Rate 80.  Left lateral branch block.  Nuclear stress 12/16/2023:   Findings are consistent with infarction. The study is intermediate risk.   No ST deviation was noted.   LV perfusion is abnormal. Defect 1: There is a medium defect with mild reduction in uptake present in the apical to mid anterior and apex location(s) that is fixed. There is abnormal wall motion in the defect area. Consistent with infarction. The defect is consistent with abnormal perfusion in the LAD territory.   Left ventricular function is normal. Nuclear stress EF: 55%. The left ventricular ejection fraction is normal  (55-65%). End diastolic cavity size is normal. End systolic cavity size is normal. No evidence of transient ischemic dilation (TID) noted.   CT images were obtained for attenuation correction and were examined for the presence of coronary calcium  when appropriate.   Coronary calcium  was present on the attenuation correction CT images. Moderate coronary calcifications were present. Coronary calcifications were present in the left anterior descending artery distribution(s).   Prior study not available for comparison.  TTE 07/15/2023: 1. Left ventricular ejection fraction, by estimation, is 45 to 50%. Left  ventricular ejection fraction by 2D MOD biplane is 50.4 %. The left  ventricle has mildly decreased function. The left ventricle demonstrates  global hypokinesis. Mild-moderate left  ventricular hypertrophy. Left ventricular diastolic parameters are  indeterminate.   2. Right ventricular systolic function is normal. The right ventricular  size is normal. Tricuspid regurgitation signal is inadequate for assessing  PA pressure.   3. The mitral valve is grossly normal. Trivial mitral valve  regurgitation. No evidence of mitral stenosis.   4. The aortic valve is tricuspid. Aortic valve regurgitation is not  visualized. No aortic stenosis is present.   5. Pulmonic valve regurgitation is moderate.   6. The inferior vena cava is normal in size with greater than 50%  respiratory variability, suggesting right atrial pressure of 3 mmHg.     Lynwood Geofm RIGGERS [facility] Short Stay Center/Anesthesiology Phone [phone] 08/13/2024 12:43 PM

## 2024-08-13 NOTE — Anesthesia Preprocedure Evaluation (Signed)
 "                                  Anesthesia Evaluation  Patient identified by MRN, date of birth, ID band Patient awake    Reviewed: Allergy & Precautions, NPO status , Patient's Chart, lab work & pertinent test results, reviewed documented beta blocker date and time   History of Anesthesia Complications (+) PONV and history of anesthetic complications  Airway Mallampati: III  TM Distance: >3 FB     Dental no notable dental hx.    Pulmonary sleep apnea , neg COPD, Recent URI , Resolved   breath sounds clear to auscultation       Cardiovascular hypertension, + CAD  (-) Past MI, (-) Cardiac Stents, (-) CABG and (-) CHF + dysrhythmias (-) pacemaker(-) Cardiac Defibrillator  Rhythm:Regular Rate:Normal  IMPRESSIONS     1. Left ventricular ejection fraction, by estimation, is 45 to 50%. Left  ventricular ejection fraction by 2D MOD biplane is 50.4 %. The left  ventricle has mildly decreased function. The left ventricle demonstrates  global hypokinesis. Mild-moderate left  ventricular hypertrophy. Left ventricular diastolic parameters are  indeterminate.   2. Right ventricular systolic function is normal. The right ventricular  size is normal. Tricuspid regurgitation signal is inadequate for assessing  PA pressure.   3. The mitral valve is grossly normal. Trivial mitral valve  regurgitation. No evidence of mitral stenosis.   4. The aortic valve is tricuspid. Aortic valve regurgitation is not  visualized. No aortic stenosis is present.   5. Pulmonic valve regurgitation is moderate.   6. The inferior vena cava is normal in size with greater than 50%  respiratory variability, suggesting right atrial pressure of 3 mmHg.     Neuro/Psych  Headaches, neg Seizures PSYCHIATRIC DISORDERS Anxiety        GI/Hepatic ,GERD  ,,(+) neg Cirrhosis      , Hepatitis -  Endo/Other  Hypothyroidism    Renal/GU Renal disease     Musculoskeletal   Abdominal   Peds   Hematology  (+) Blood dyscrasia Chronic thrombocytopenia   Anesthesia Other Findings   Reproductive/Obstetrics                              Anesthesia Physical Anesthesia Plan  ASA: 2  Anesthesia Plan: General   Post-op Pain Management:    Induction: Intravenous  PONV Risk Score and Plan: 3 and Ondansetron , Propofol  infusion and Dexamethasone   Airway Management Planned: Oral ETT  Additional Equipment:   Intra-op Plan:   Post-operative Plan: Extubation in OR  Informed Consent: I have reviewed the patients History and Physical, chart, labs and discussed the procedure including the risks, benefits and alternatives for the proposed anesthesia with the patient or authorized representative who has indicated his/her understanding and acceptance.     Dental advisory given  Plan Discussed with: CRNA  Anesthesia Plan Comments: (PAT note by Lynwood Hope, PA-C: 64 year old male follows with cardiology for history of HTN, OSA treated with oral appliance, moderate nonobstructive CAD by CCTA 2016, HFrEF, chronic left bundle branch block.  Nuclear stress 11/2023 interpreted as intermediate risk.  Dr. Bernie commented on results stating, Stress test showing no evidence of ischemia, there is a fixed defect which can be related to left bundle branch block or old myocardial infarction most likely left bundle in his situation.  Echo  06/2023 showed LVEF 45 to 50%, normal RV systolic function, no significant valvular abnormalities.  Last seen in follow-up by Dr. Bernie 06/28/2024, BP noted to be slightly elevated and recommended to keep a home log, otherwise stable, no changes to management.  Other pertinent history includes PON V, GERD on PPI, hypothyroidism, chronic thrombocytopenia.  Patient was seen via video visit 08/02/2024 with complaint of congestion and productive cough.  He was noted to already have been taking doxycycline  for Derm reasons.  Augmentin  was  added.  Patient reports his symptoms abated quickly and he has felt well since January 8 or 9.  Advised if he continues to feel well anticipate he can proceed with surgery as planned.  Preop labs reviewed, mild thrombocytopenia platelets 120k, otherwise unremarkable.  EKG 06/28/2024: NSR.  Rate 80.  Left lateral branch block.  Nuclear stress 12/16/2023:   Findings are consistent with infarction. The study is intermediate risk.   No ST deviation was noted.   LV perfusion is abnormal. Defect 1: There is a medium defect with mild reduction in uptake present in the apical to mid anterior and apex location(s) that is fixed. There is abnormal wall motion in the defect area. Consistent with infarction. The defect is consistent with abnormal perfusion in the LAD territory.   Left ventricular function is normal. Nuclear stress EF: 55%. The left ventricular ejection fraction is normal (55-65%). End diastolic cavity size is normal. End systolic cavity size is normal. No evidence of transient ischemic dilation (TID) noted.   CT images were obtained for attenuation correction and were examined for the presence of coronary calcium  when appropriate.   Coronary calcium  was present on the attenuation correction CT images. Moderate coronary calcifications were present. Coronary calcifications were present in the left anterior descending artery distribution(s).   Prior study not available for comparison.  TTE 07/15/2023: 1. Left ventricular ejection fraction, by estimation, is 45 to 50%. Left  ventricular ejection fraction by 2D MOD biplane is 50.4 %. The left  ventricle has mildly decreased function. The left ventricle demonstrates  global hypokinesis. Mild-moderate left  ventricular hypertrophy. Left ventricular diastolic parameters are  indeterminate.   2. Right ventricular systolic function is normal. The right ventricular  size is normal. Tricuspid regurgitation signal is inadequate for assessing  PA  pressure.   3. The mitral valve is grossly normal. Trivial mitral valve  regurgitation. No evidence of mitral stenosis.   4. The aortic valve is tricuspid. Aortic valve regurgitation is not  visualized. No aortic stenosis is present.   5. Pulmonic valve regurgitation is moderate.   6. The inferior vena cava is normal in size with greater than 50%  respiratory variability, suggesting right atrial pressure of 3 mmHg.    )         Anesthesia Quick Evaluation  "

## 2024-08-18 ENCOUNTER — Ambulatory Visit (HOSPITAL_COMMUNITY): Payer: Self-pay | Admitting: Physician Assistant

## 2024-08-18 ENCOUNTER — Encounter (HOSPITAL_COMMUNITY): Admission: RE | Disposition: A | Payer: Self-pay | Source: Home / Self Care | Attending: Otolaryngology

## 2024-08-18 ENCOUNTER — Ambulatory Visit (HOSPITAL_COMMUNITY)
Admission: RE | Admit: 2024-08-18 | Discharge: 2024-08-18 | Disposition: A | Discharge status: Home / Self Care | Attending: Otolaryngology | Admitting: Otolaryngology

## 2024-08-18 ENCOUNTER — Ambulatory Visit (HOSPITAL_COMMUNITY): Admitting: Anesthesiology

## 2024-08-18 DIAGNOSIS — D693 Immune thrombocytopenic purpura: Secondary | ICD-10-CM | POA: Insufficient documentation

## 2024-08-18 DIAGNOSIS — I10 Essential (primary) hypertension: Secondary | ICD-10-CM | POA: Insufficient documentation

## 2024-08-18 DIAGNOSIS — C4442 Squamous cell carcinoma of skin of scalp and neck: Secondary | ICD-10-CM | POA: Insufficient documentation

## 2024-08-18 DIAGNOSIS — M952 Other acquired deformity of head: Secondary | ICD-10-CM | POA: Diagnosis present

## 2024-08-18 DIAGNOSIS — G4733 Obstructive sleep apnea (adult) (pediatric): Secondary | ICD-10-CM | POA: Diagnosis not present

## 2024-08-18 DIAGNOSIS — T884XXA Failed or difficult intubation, initial encounter: Secondary | ICD-10-CM

## 2024-08-18 DIAGNOSIS — Z79890 Hormone replacement therapy (postmenopausal): Secondary | ICD-10-CM | POA: Insufficient documentation

## 2024-08-18 DIAGNOSIS — L570 Actinic keratosis: Secondary | ICD-10-CM | POA: Diagnosis not present

## 2024-08-18 DIAGNOSIS — I2510 Atherosclerotic heart disease of native coronary artery without angina pectoris: Secondary | ICD-10-CM | POA: Diagnosis not present

## 2024-08-18 DIAGNOSIS — E039 Hypothyroidism, unspecified: Secondary | ICD-10-CM | POA: Insufficient documentation

## 2024-08-18 DIAGNOSIS — C4492 Squamous cell carcinoma of skin, unspecified: Secondary | ICD-10-CM

## 2024-08-18 HISTORY — DX: Failed or difficult intubation, initial encounter: T88.4XXA

## 2024-08-18 MED ORDER — LIDOCAINE 2% (20 MG/ML) 5 ML SYRINGE
INTRAMUSCULAR | Status: AC
  Filled 2024-08-18: qty 5

## 2024-08-18 MED ORDER — LIDOCAINE-EPINEPHRINE 1 %-1:100000 IJ SOLN
INTRAMUSCULAR | Status: DC | PRN
  Administered 2024-08-18: 5.2 mL

## 2024-08-18 MED ORDER — CHLORHEXIDINE GLUCONATE 0.12 % MT SOLN
15.0000 mL | Freq: Once | OROMUCOSAL | Status: AC
  Administered 2024-08-18: 15 mL via OROMUCOSAL
  Filled 2024-08-18: qty 15

## 2024-08-18 MED ORDER — OXYCODONE HCL 5 MG PO TABS: 5.0000 mg | ORAL_TABLET | Freq: Once | ORAL | Status: DC | PRN

## 2024-08-18 MED ORDER — ONDANSETRON HCL 4 MG/2ML IJ SOLN
4.0000 mg | Freq: Once | INTRAMUSCULAR | Status: AC | PRN
  Administered 2024-08-18: 4 mg via INTRAVENOUS

## 2024-08-18 MED ORDER — BACITRACIN ZINC 500 UNIT/GM EX OINT
TOPICAL_OINTMENT | CUTANEOUS | Status: AC
  Filled 2024-08-18: qty 28.35

## 2024-08-18 MED ORDER — MIDAZOLAM HCL (PF) 2 MG/2ML IJ SOLN
INTRAMUSCULAR | Status: DC | PRN
  Administered 2024-08-18: 2 mg via INTRAVENOUS

## 2024-08-18 MED ORDER — FENTANYL CITRATE (PF) 100 MCG/2ML IJ SOLN
INTRAMUSCULAR | Status: AC
  Filled 2024-08-18: qty 2

## 2024-08-18 MED ORDER — OXYCODONE HCL 5 MG/5ML PO SOLN: 5.0000 mg | Freq: Once | ORAL | Status: DC | PRN

## 2024-08-18 MED ORDER — 0.9 % SODIUM CHLORIDE (POUR BTL) OPTIME
TOPICAL | Status: DC | PRN
  Administered 2024-08-18: 1000 mL

## 2024-08-18 MED ORDER — MIDAZOLAM HCL 2 MG/2ML IJ SOLN
INTRAMUSCULAR | Status: AC
  Filled 2024-08-18: qty 2

## 2024-08-18 MED ORDER — FENTANYL CITRATE (PF) 250 MCG/5ML IJ SOLN
INTRAMUSCULAR | Status: DC | PRN
  Administered 2024-08-18: 25 ug via INTRAVENOUS
  Administered 2024-08-18: 100 ug via INTRAVENOUS
  Administered 2024-08-18: 25 ug via INTRAVENOUS

## 2024-08-18 MED ORDER — SUGAMMADEX SODIUM 200 MG/2ML IV SOLN
INTRAVENOUS | Status: DC | PRN
  Administered 2024-08-18: 200 mg via INTRAVENOUS

## 2024-08-18 MED ORDER — PROPOFOL 10 MG/ML IV BOLUS
INTRAVENOUS | Status: DC | PRN
  Administered 2024-08-18: 150 mg via INTRAVENOUS

## 2024-08-18 MED ORDER — CEFAZOLIN SODIUM-DEXTROSE 2-4 GM/100ML-% IV SOLN
2.0000 g | INTRAVENOUS | Status: AC
  Administered 2024-08-18: 2 g via INTRAVENOUS
  Filled 2024-08-18: qty 100

## 2024-08-18 MED ORDER — DEXAMETHASONE SOD PHOSPHATE PF 10 MG/ML IJ SOLN
INTRAMUSCULAR | Status: DC | PRN
  Administered 2024-08-18: 10 mg via INTRAVENOUS

## 2024-08-18 MED ORDER — ACETAMINOPHEN 10 MG/ML IV SOLN
1000.0000 mg | Freq: Once | INTRAVENOUS | Status: DC | PRN
  Administered 2024-08-18: 1000 mg via INTRAVENOUS

## 2024-08-18 MED ORDER — ONDANSETRON HCL 4 MG/2ML IJ SOLN
INTRAMUSCULAR | Status: AC
  Filled 2024-08-18: qty 2

## 2024-08-18 MED ORDER — HYDROCODONE-ACETAMINOPHEN 5-325 MG PO TABS: 1.0000 | ORAL_TABLET | Freq: Four times a day (QID) | ORAL | 0 refills | Status: AC | PRN

## 2024-08-18 MED ORDER — LIDOCAINE-EPINEPHRINE 1 %-1:100000 IJ SOLN
INTRAMUSCULAR | Status: AC
  Filled 2024-08-18: qty 1

## 2024-08-18 MED ORDER — ROCURONIUM BROMIDE 10 MG/ML (PF) SYRINGE
PREFILLED_SYRINGE | INTRAVENOUS | Status: DC | PRN
  Administered 2024-08-18: 70 mg via INTRAVENOUS

## 2024-08-18 MED ORDER — LACTATED RINGERS IV SOLN: INTRAVENOUS | Status: DC

## 2024-08-18 MED ORDER — PROPOFOL 500 MG/50ML IV EMUL
INTRAVENOUS | Status: DC | PRN
  Administered 2024-08-18: 30 ug/kg/min via INTRAVENOUS

## 2024-08-18 MED ORDER — PHENYLEPHRINE 80 MCG/ML (10ML) SYRINGE FOR IV PUSH (FOR BLOOD PRESSURE SUPPORT)
PREFILLED_SYRINGE | INTRAVENOUS | Status: DC | PRN
  Administered 2024-08-18: 160 ug via INTRAVENOUS

## 2024-08-18 MED ORDER — FENTANYL CITRATE (PF) 250 MCG/5ML IJ SOLN
INTRAMUSCULAR | Status: AC
  Filled 2024-08-18: qty 5

## 2024-08-18 MED ORDER — ORAL CARE MOUTH RINSE: 15.0000 mL | Freq: Once | OROMUCOSAL | Status: AC

## 2024-08-18 MED ORDER — CEFAZOLIN SODIUM-DEXTROSE 2-3 GM-%(50ML) IV SOLR
INTRAVENOUS | Status: DC | PRN
  Administered 2024-08-18: 2 g via INTRAVENOUS

## 2024-08-18 MED ORDER — PROPOFOL 10 MG/ML IV BOLUS
INTRAVENOUS | Status: AC
  Filled 2024-08-18: qty 20

## 2024-08-18 MED ORDER — FENTANYL CITRATE (PF) 100 MCG/2ML IJ SOLN
25.0000 ug | INTRAMUSCULAR | Status: DC | PRN
  Administered 2024-08-18: 25 ug via INTRAVENOUS

## 2024-08-18 MED ORDER — BACITRACIN ZINC 500 UNIT/GM EX OINT
TOPICAL_OINTMENT | CUTANEOUS | Status: DC | PRN
  Administered 2024-08-18: 1 via TOPICAL

## 2024-08-18 MED ORDER — ONDANSETRON HCL 4 MG/2ML IJ SOLN
INTRAMUSCULAR | Status: DC | PRN
  Administered 2024-08-18: 4 mg via INTRAVENOUS

## 2024-08-18 MED ORDER — LIDOCAINE 2% (20 MG/ML) 5 ML SYRINGE
INTRAMUSCULAR | Status: DC | PRN
  Administered 2024-08-18: 100 mg via INTRAVENOUS

## 2024-08-18 NOTE — Progress Notes (Signed)
 Fentanyl wasted in stericycle with Lancie Clark-Curry RN.  Versie Starks, RN

## 2024-08-18 NOTE — Anesthesia Procedure Notes (Signed)
 Procedure Name: Intubation Date/Time: 08/18/2024 1:26 PM  Performed by: Kirklin Mcduffee J, CRNAPre-anesthesia Checklist: Patient identified, Emergency Drugs available, Suction available and Patient being monitored Patient Re-evaluated:Patient Re-evaluated prior to induction Oxygen Delivery Method: Circle System Utilized Preoxygenation: Pre-oxygenation with 100% oxygen Induction Type: IV induction Ventilation: Mask ventilation without difficulty Laryngoscope Size: Glidescope and 4 Grade View: Grade I Tube type: Oral Tube size: 7.5 mm Number of attempts: 1 Airway Equipment and Method: Stylet, Oral airway, Bougie stylet, Video-laryngoscopy and Rigid stylet Placement Confirmation: ETT inserted through vocal cords under direct vision, positive ETCO2 and breath sounds checked- equal and bilateral Secured at: 23 cm Tube secured with: Tape Dental Injury: Teeth and Oropharynx as per pre-operative assessment  Difficulty Due To: Difficulty was unanticipated and Difficult Airway- due to anterior larynx Comments: Initial DL with miller 3 and mac4 epiglottis view only, attempted bougie unsuccessful, glidescope intubation

## 2024-08-18 NOTE — Discharge Instructions (Signed)

## 2024-08-18 NOTE — Op Note (Signed)
 OPERATIVE NOTE  Peter Spencer Date/Time of Admission: 08/18/2024 10:56 AM  CSN: [id number];FMW:[id number] Attending Provider: Luciano Standing, MD Room/Bed: MCPO/NONE DOB: February 24, 1961 Age: 64 y.o.   Pre-Op Diagnosis: Squamous cell carcinoma of scalp; Acquired deformity of head  Post-Op Diagnosis: Squamous cell carcinoma of scalp; Acquired deformity of head  Procedure: Wide local excision malignant neoplasm of scalp 3-4cm (CPT [phone]) Placement of skin graft substitute 7cm2 (CPT 15725)  Anesthesia: General  Surgeon(s): Standing Peter Luciano, MD  Staff: Circulator: Peter Maryelizabeth CROME, RN Scrub Person: Peter Spencer Vendor Representative : Peter Spencer Circulator Assistant: Peter Krabbe, RN  Implants: Implant Name Type Inv. Item Serial No. Manufacturer Lot No. LRB No. Used Action  GRAFT MYRIAD 3 LAYER 5X5 - [id number] Graft GRAFT MYRIAD 3 LAYER 5X5  [employer] [id number] N/A 1 Implanted    Specimens: ID Type Source Tests Collected by Time Destination  1 : Wide local excision scalp tumor (long anterior, short lateral) Tissue PATH Soft tissue SURGICAL PATHOLOGY Spencer Standing, MD 08/18/2024 1355   2 : Scalp 12 to 3 Tissue PATH Soft tissue SURGICAL PATHOLOGY Spencer Standing, MD 08/18/2024 1346   3 : Scalp 3 to 6 Tissue PATH Soft tissue SURGICAL PATHOLOGY Spencer Standing, MD 08/18/2024 1357   4 : Scalp 6 to 9 Tissue PATH Soft tissue SURGICAL PATHOLOGY Spencer Standing, MD 08/18/2024 1357   5 : Scalp 9 to 12 Tissue PATH Soft tissue SURGICAL PATHOLOGY Spencer Standing, MD 08/18/2024 1357     Complications: none  EBL: 25 ML  IVF: Per anesthesia ML  Condition: stable  Operative Findings:  1.4cm irregular erythematous scar at prior site of invasive squamous cell carcinoma biopsy excised to negative margins resulting in 3.1cm circular defect.   Pre-excision:   After Excision:    Description of Operation: The patient was identified in the preoperative  area and consent confirmed in the chart.  He was brought to the operating room by the anesthetist and a preoperative huddle was performed confirming the patient identity and procedure to be performed.  Once all were in agreement we proceed with surgery.  General anesthesia was induced and the patient was intubated with an oral endotracheal tube.  Tube was secured the patient was turned 90 degrees from the anesthetist.  The head was propped up and examined with the findings as noted above.     The vertex scalp squamous cell carcinoma was marked out with 10 mm margins and local was infiltrated in the scalp a total of 6 mL of 1% lidocaine  with epinephrine . Patient was prepped and draped in standard fashion for procedure of this kind.  A final preoperative pause was performed and we proceed with surgery.   We began with wide local excision malignant neoplasm of the scalp (squamous cell carcinoma) with total excision of approximately 3.1cm diameter circular defect.  The circumferential margins were marked from 1-12 o'clock in quadrants.  The skin was excised to level of loose areolar fascia above the periosteum.  The tumor was grasped with double prong skin hooks tenotomy scissors used to remove the specimen sharply. The tumor was inspected without any evidence of tumor along the deep layer of the galea aponeurosis. Once the tumor was removed was oriented with silk sutures, a long marking anterior and short marking right lateral.  Next circumferential margins were taken.  All margins were negative for malignancy.  Hemostasis was achieved with bipolar cautery.    Next an myriad tri-layer graft 3.1cm in diameter was used to reconstruct  the large vertex scalp defect (~7cm2).  This was secured with a running 5-0 Chromic Gut sutures followed by surgilube, sorbact mesh, a Xeroform bolster with 3-0 silk sutures.   The wounds were copiously irrigated and cleansed. The patient was then turned back to the anesthetist to  extubated and brought to the recovery room in stable condition.  All counts were correct and final.   Peter Peter Coddington, MD [facility]  08/18/2024

## 2024-08-18 NOTE — H&P (Signed)
 Peter Spencer is an 64 y.o. male.    Chief Complaint:  Squamous cell carcinoma scalp   HPI: Patient presents today for planned elective procedure.  He/she denies any interval change in history since office visit on 07/16/24.   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   Hyperlipidemia    Hypertension    no meds now   Hypertensive urgency 09/28/2013   Hypothyroidism    LBBB (left bundle branch block) 09/29/2013   Left bundle branch block    OSA (obstructive sleep apnea)    PCP NOTES >>>>>>>>>>>>>> 03/02/2020   PONV (postoperative nausea and vomiting)    Retinal detachment 06/2018   Right   Retinal detachment, right 11/25/2019   SCC (squamous cell carcinoma)    Seasonal allergies    Thrombocytopenia (HCC)--history of, reports extensive w/u (early 2000, ITP?) 11/29/2020   Thyroid  disease     Past Surgical History:  Procedure Laterality Date   BASAL CELL CARCINOMA EXCISION N/A 10/30/2020   Procedure: Excision of scalp squamous cell carcinoma with frozen section, application of integra;  Surgeon: Elisabeth Craig RAMAN, MD;  Location: [facility];  Service: Plastics;  Laterality: N/A;  1 hour, please   COLONOSCOPY  01/15/2013   .   COLONOSCOPY  11/27/2015   [facility]. Gunnar Daniels, MD. Diverticulosis of the whole colon, ascending colon and descending colon. Grade 2 internal hemorrhoids. Repeat in 5 years.   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   DUPUYTREN CONTRACTURE RELEASE Left 03/17/2024   FASCIECTOMY Left 07/11/2016   Procedure: FASCIECTOMY left index possible joint release;  Surgeon: Arley Curia, MD;  Location:  SURGERY CENTER;  Service: Orthopedics;  Laterality: Left;  FASCIECTOMY left index possible joint release   KNEE SURGERY Left    in high school   MOHS SURGERY  2017   NASAL FRACTURE SURGERY     RETINAL DETACHMENT SURGERY Right 06/2018   RETINAL DETACHMENT SURGERY Left 2022   TOE SURGERY     TONSILLECTOMY  1965    Family History  Problem Relation Age of Onset   Pancreatic cancer Mother    Atrial fibrillation Father    Dementia Father    Parkinsonism Father    CAD Other    Stroke Neg Hx    Diabetes Mellitus II Neg Hx    Colon cancer Neg Hx    Prostate cancer Neg Hx    Liver disease Neg Hx    Esophageal cancer Neg Hx     Social History:  reports that he has never smoked. He has never used smokeless tobacco. He reports that he does not currently use alcohol. He reports that he does not use drugs.  Allergies: Allergies[1]  Medications Prior to Admission  Medication Sig Dispense Refill   albuterol  (VENTOLIN  HFA) 108 (90 Base) MCG/ACT inhaler Inhale 2 puffs into the lungs every 6 (six) hours as needed for wheezing or shortness of breath. 8  g 0   aspirin  EC 81 MG tablet Take 81 mg by mouth daily.     azelastine  (ASTELIN ) 0.1 % nasal spray Place 2 sprays into both nostrils at bedtime as needed for rhinitis or allergies. Use in each nostril as directed 30 mL 12   Cholecalciferol (VITAMIN D ) 125 MCG (5000 UT) CAPS Take 5,000 Units by mouth daily.     esomeprazole  (NEXIUM ) 40 MG capsule Take 1 capsule (40 mg total) by mouth 2 (two) times daily before a meal. 180 capsule 1   ezetimibe  (ZETIA ) 10 MG tablet Take 1 tablet (10 mg total) by mouth daily. 90 tablet 3   fluorouracil (EFUDEX) 5 % cream Apply 1 Application topically daily as needed (Treatment).     fluticasone (FLONASE) 50  MCG/ACT nasal spray Place 1 spray into both nostrils as needed for allergies or rhinitis.     ketoconazole (NIZORAL) 2 % cream Apply 1 Application topically daily as needed for irritation.     levothyroxine  (SYNTHROID ) 112 MCG tablet TAKE 1 TABLET BY MOUTH DAILY BEFORE BREAKFAST. 90 tablet 0   magnesium  oxide (MAG-OX) 400 MG tablet Take 400 mg by mouth daily.     metroNIDAZOLE  (METROGEL ) 0.75 % gel Apply 1 application. topically as needed (rash).     Omega-3 1000 MG CAPS Take 3,800 mg by mouth daily.     pravastatin  (PRAVACHOL ) 40 MG tablet TAKE 1 TABLET BY MOUTH EVERYDAY AT BEDTIME 90 tablet 1   topiramate (TOPAMAX) 25 MG tablet Take 50 mg by mouth at bedtime.     TYRVAYA 0.03 MG/ACT SOLN Place 1 spray into the nose 2 (two) times daily.     zolpidem  (AMBIEN  CR) 12.5 MG CR tablet Take 1 tablet (12.5 mg total) by mouth at bedtime as needed for sleep. 90 tablet 0   amoxicillin -clavulanate (AUGMENTIN ) 875-125 MG tablet Take 1 tablet by mouth 2 (two) times daily. (Patient not taking: Reported on 08/12/2024) 14 tablet 0   doxycycline  (VIBRAMYCIN ) 100 MG capsule Take 100 mg by mouth daily. (Patient not taking: Reported on 08/17/2024)      No results found for this or any previous visit (from the past 48 hours). No results found.  ROS: negative other than stated in HPI  Blood pressure (!) 143/87, pulse 95, temperature 98.4 F (36.9 C), temperature source Oral, resp. rate 20, height 6' 2 (1.88 m), weight 85.3 kg, SpO2 98%.  PHYSICAL EXAM: General: Resting comfortably in NAD  Lungs: Non-labored respiratinos  Studies Reviewed:  CT Head w/wo contrast 07/23/24  Impression  1.  No acute intracranial abnormality. 2.  No intracranial enhancing lesions. 3.  Reported scalp lesions are not well characterized on this study, although there is some skin thickening at the vertex on the left. No underlying osseous erosion identifi   Assessment/Plan Squamous cell carcinoma of of the scalp  Acquired  deformity of head  Proceed with WLE, possible cortical burring, possible skin graft substitute vs. Full thickness skin graft.      Electronically signed by:  Elspeth Coddington, MD  Facial Plastic & Reconstructive Surgery Otolaryngology - Head and Neck Surgery [facility]  08/18/2024, 12:41 PM       [1]  Allergies Allergen Reactions   Dust Mite Extract    Mold Extract [Trichophyton]    Tetracyclines & Related Other (See Comments)    Caused liver enzymes to go up   Sulfa Antibiotics Nausea And Vomiting and Nausea Only

## 2024-08-18 NOTE — Transfer of Care (Signed)
 Immediate Anesthesia Transfer of Care Note  Patient: Peter Spencer  Procedure(s) Performed: WIDE LOCAL EXCSION OF SCALP NEOPLASM (Bilateral: Scalp) APPLICATION, SKIN SUBSTITUTE (Bilateral: Scalp)  Patient Location: PACU  Anesthesia Type:General  Level of Consciousness: awake, alert , and oriented  Airway & Oxygen Therapy: Patient Spontanous Breathing and Patient connected to face mask oxygen  Post-op Assessment: Report given to RN and Post -op Vital signs reviewed and stable  Post vital signs: Reviewed and stable  Last Vitals:  Vitals Value Taken Time  BP 126/77 08/18/24 15:17  Temp    Pulse 83 08/18/24 15:21  Resp 14 08/18/24 15:21  SpO2 94 % 08/18/24 15:21  Vitals shown include unfiled device data.  Last Pain:  Vitals:   08/18/24 1152  TempSrc:   PainSc: 6       Patients Stated Pain Goal: 2 (08/18/24 1152)  Complications: No notable events documented.

## 2024-08-19 ENCOUNTER — Encounter (HOSPITAL_COMMUNITY): Payer: Self-pay | Admitting: Otolaryngology

## 2024-08-19 NOTE — Anesthesia Postprocedure Evaluation (Signed)
"   Anesthesia Post Note  Patient: Peter Spencer  Procedure(s) Performed: WIDE LOCAL EXCSION OF SCALP NEOPLASM (Bilateral: Scalp) APPLICATION, SKIN SUBSTITUTE (Bilateral: Scalp)     Patient location during evaluation: PACU Anesthesia Type: General Level of consciousness: awake and alert Pain management: pain level controlled Vital Signs Assessment: post-procedure vital signs reviewed and stable Respiratory status: spontaneous breathing, nonlabored ventilation, respiratory function stable and patient connected to nasal cannula oxygen Cardiovascular status: blood pressure returned to baseline and stable Postop Assessment: no apparent nausea or vomiting Anesthetic complications: yes   No notable events documented.  Last Vitals:  Vitals:   08/18/24 1615 08/18/24 1630  BP: 137/83 138/83  Pulse: 78 84  Resp: 12 14  Temp:  36.7 C  SpO2: 94% 100%    Last Pain:  Vitals:   08/18/24 1552  TempSrc:   PainSc: 5                  [name]      "

## 2024-08-20 ENCOUNTER — Encounter (HOSPITAL_COMMUNITY): Payer: Self-pay | Admitting: Otolaryngology

## 2024-08-20 LAB — SURGICAL PATHOLOGY

## 2024-09-07 ENCOUNTER — Ambulatory Visit: Admitting: Internal Medicine

## 2024-09-07 ENCOUNTER — Ambulatory Visit
